# Patient Record
Sex: Female | Born: 1970 | Race: Black or African American | Hispanic: No | Marital: Single | Smoking: Current every day smoker
Health system: Southern US, Community
[De-identification: ages and names within clinical notes are randomized; demographics above are authoritative.]

## PROBLEM LIST (undated history)

## (undated) DIAGNOSIS — I10 Essential (primary) hypertension: Secondary | ICD-10-CM

## (undated) DIAGNOSIS — E785 Hyperlipidemia, unspecified: Secondary | ICD-10-CM

## (undated) HISTORY — DX: Hyperlipidemia, unspecified: E78.5

## (undated) HISTORY — PX: BREAST REDUCTION SURGERY: SHX8

## (undated) HISTORY — DX: Essential (primary) hypertension: I10

## (undated) HISTORY — PX: REDUCTION MAMMAPLASTY: SUR839

---

## 2007-12-28 ENCOUNTER — Other Ambulatory Visit: Admission: RE | Admit: 2007-12-28 | Discharge: 2007-12-28 | Payer: Self-pay | Admitting: Family Medicine

## 2008-11-13 ENCOUNTER — Encounter: Admission: RE | Admit: 2008-11-13 | Discharge: 2009-01-14 | Payer: Self-pay | Admitting: Internal Medicine

## 2009-01-13 ENCOUNTER — Ambulatory Visit (HOSPITAL_COMMUNITY): Admission: RE | Admit: 2009-01-13 | Discharge: 2009-01-13 | Payer: Self-pay | Admitting: Internal Medicine

## 2009-02-05 ENCOUNTER — Ambulatory Visit: Payer: Self-pay | Admitting: Sports Medicine

## 2009-02-05 DIAGNOSIS — M25569 Pain in unspecified knee: Secondary | ICD-10-CM | POA: Insufficient documentation

## 2009-02-05 DIAGNOSIS — S93499A Sprain of other ligament of unspecified ankle, initial encounter: Secondary | ICD-10-CM | POA: Insufficient documentation

## 2009-02-05 DIAGNOSIS — S96819A Strain of other specified muscles and tendons at ankle and foot level, unspecified foot, initial encounter: Secondary | ICD-10-CM

## 2009-02-05 DIAGNOSIS — M7040 Prepatellar bursitis, unspecified knee: Secondary | ICD-10-CM | POA: Insufficient documentation

## 2009-03-05 ENCOUNTER — Ambulatory Visit: Payer: Self-pay | Admitting: Sports Medicine

## 2009-04-02 ENCOUNTER — Other Ambulatory Visit: Admission: RE | Admit: 2009-04-02 | Discharge: 2009-04-02 | Payer: Self-pay | Admitting: Family Medicine

## 2009-06-01 ENCOUNTER — Emergency Department (HOSPITAL_COMMUNITY): Admission: EM | Admit: 2009-06-01 | Discharge: 2009-06-01 | Payer: Self-pay | Admitting: Family Medicine

## 2010-02-18 ENCOUNTER — Other Ambulatory Visit: Admission: RE | Admit: 2010-02-18 | Discharge: 2010-02-18 | Payer: Self-pay | Admitting: Family Medicine

## 2010-07-05 ENCOUNTER — Encounter: Payer: Self-pay | Admitting: Internal Medicine

## 2011-01-12 ENCOUNTER — Inpatient Hospital Stay (INDEPENDENT_AMBULATORY_CARE_PROVIDER_SITE_OTHER)
Admission: RE | Admit: 2011-01-12 | Discharge: 2011-01-12 | Disposition: A | Discharge status: Home / Self Care | Payer: 59 | Source: Ambulatory Visit | Attending: Emergency Medicine | Admitting: Emergency Medicine

## 2011-01-12 DIAGNOSIS — J45909 Unspecified asthma, uncomplicated: Secondary | ICD-10-CM

## 2011-01-12 DIAGNOSIS — R059 Cough, unspecified: Secondary | ICD-10-CM

## 2011-01-12 DIAGNOSIS — R05 Cough: Secondary | ICD-10-CM

## 2011-04-04 ENCOUNTER — Encounter: Payer: Self-pay | Admitting: Family Medicine

## 2011-04-05 ENCOUNTER — Encounter: Payer: Self-pay | Admitting: Family Medicine

## 2011-04-05 ENCOUNTER — Other Ambulatory Visit (HOSPITAL_COMMUNITY)
Admission: RE | Admit: 2011-04-05 | Discharge: 2011-04-05 | Disposition: A | Discharge status: Home / Self Care | Payer: 59 | Source: Ambulatory Visit | Attending: Family Medicine | Admitting: Family Medicine

## 2011-04-05 ENCOUNTER — Ambulatory Visit (INDEPENDENT_AMBULATORY_CARE_PROVIDER_SITE_OTHER): Payer: 59 | Admitting: Family Medicine

## 2011-04-05 VITALS — BP 132/72 | HR 83 | Temp 98.7°F | Ht 63.0 in | Wt 163.0 lb

## 2011-04-05 DIAGNOSIS — I10 Essential (primary) hypertension: Secondary | ICD-10-CM | POA: Insufficient documentation

## 2011-04-05 DIAGNOSIS — Z0000 Encounter for general adult medical examination without abnormal findings: Secondary | ICD-10-CM

## 2011-04-05 DIAGNOSIS — J45909 Unspecified asthma, uncomplicated: Secondary | ICD-10-CM | POA: Insufficient documentation

## 2011-04-05 DIAGNOSIS — Z01419 Encounter for gynecological examination (general) (routine) without abnormal findings: Secondary | ICD-10-CM | POA: Insufficient documentation

## 2011-04-05 DIAGNOSIS — E785 Hyperlipidemia, unspecified: Secondary | ICD-10-CM | POA: Insufficient documentation

## 2011-04-05 NOTE — Progress Notes (Signed)
Subjective:     Shakeema Lippman is a 40 y.o. female and is here for a comprehensive physical exam. The patient reports no problems.  History   Social History  . Marital Status: Single    Spouse Name: N/A    Number of Children: N/A  . Years of Education: N/A   Occupational History  . [employer] [place]   Social History Main Topics  . Smoking status: Current Everyday Smoker -- 1.0 packs/day for 20 years    Types: Cigarettes  . Smokeless tobacco: Current User    Types: Chew  . Alcohol Use: Yes     Socially  . Drug Use: No  . Sexually Active: Yes -- Female partner(s)   Other Topics Concern  . Not on file   Social History Narrative  . No narrative on file   Health Maintenance  Topic Date Due  . Mammogram  05/30/1988  . Pap Smear  05/30/1988  . Influenza Vaccine  03/13/2012  . Tetanus/tdap  08/05/2016    The following portions of the patient's history were reviewed and updated as appropriate: allergies, current medications, past family history, past medical history, past social history, past surgical history and problem list.  Review of Systems Review of Systems  Constitutional: Negative for activity change, appetite change and fatigue.  HENT: Negative for hearing loss, congestion, tinnitus and ear discharge.  dentist q61m Eyes: Negative for visual disturbance (see optho q1y -- vision corrected to 20/20 with glasses).  Respiratory: Negative for cough, chest tightness and shortness of breath.   Cardiovascular: Negative for chest pain, palpitations and leg swelling.  Gastrointestinal: Negative for abdominal pain, diarrhea, constipation and abdominal distention.  Genitourinary: Negative for urgency, frequency, decreased urine volume and difficulty urinating.  Musculoskeletal: Negative for back pain, arthralgias and gait problem.  Skin: Negative for color change, pallor and rash.  Neurological: Negative for dizziness, light-headedness, numbness and  headaches.  Hematological: Negative for adenopathy. Does not bruise/bleed easily.  Psychiatric/Behavioral: Negative for suicidal ideas, confusion, sleep disturbance, self-injury, dysphoric mood, decreased concentration and agitation.       Objective:    BP 132/72  Pulse 83  Temp(Src) 98.7 F (37.1 C) (Oral)  Ht 5\' 3"  (1.6 m)  Wt 163 lb (73.936 kg)  BMI 28.87 kg/m2  SpO2 98%  LMP 03/28/2011 General appearance: alert, cooperative, appears stated age and no distress Head: Normocephalic, without obvious abnormality, atraumatic Eyes: conjunctivae/corneas clear. PERRL, EOM's intact. Fundi benign. Ears: normal TM's and external ear canals both ears Nose: Nares normal. Septum midline. Mucosa normal. No drainage or sinus tenderness. Throat: lips, mucosa, and tongue normal; teeth and gums normal Neck: no adenopathy, no carotid bruit, no JVD, supple, symmetrical, trachea midline and thyroid not enlarged, symmetric, no tenderness/mass/nodules Lungs: clear to auscultation bilaterally Breasts: normal appearance, no masses or tenderness Heart: regular rate and rhythm, S1, S2 normal, no murmur, click, rub or gallop Abdomen: soft, non-tender; bowel sounds normal; no masses,  no organomegaly Pelvic: cervix normal in appearance, external genitalia normal, no adnexal masses or tenderness, no cervical motion tenderness, rectovaginal septum normal, uterus normal size, shape, and consistency and vagina normal without discharge Extremities: extremities normal, atraumatic, no cyanosis or edema Pulses: 2+ and symmetric Skin: Skin color, texture, turgor normal. No rashes or lesions Lymph nodes: Cervical, supraclavicular, and axillary nodes normal. Neurologic: Alert and oriented X 3, normal strength and tone. Normal symmetric reflexes. Normal coordination and gait psych-- no depression or anxiety    Assessment:    Healthy  female exam.    HTN-- con't meds, stable hyperlipidemia Plan:  GHm utd Check  fasting labs   See After Visit Summary for Counseling Recommendations

## 2011-04-05 NOTE — Patient Instructions (Signed)

## 2011-04-06 ENCOUNTER — Other Ambulatory Visit (INDEPENDENT_AMBULATORY_CARE_PROVIDER_SITE_OTHER): Payer: 59

## 2011-04-06 DIAGNOSIS — E785 Hyperlipidemia, unspecified: Secondary | ICD-10-CM

## 2011-04-06 DIAGNOSIS — I10 Essential (primary) hypertension: Secondary | ICD-10-CM

## 2011-04-06 DIAGNOSIS — Z0000 Encounter for general adult medical examination without abnormal findings: Secondary | ICD-10-CM

## 2011-04-06 LAB — HEPATIC FUNCTION PANEL
ALT: 23 U/L (ref 0–35)
AST: 19 U/L (ref 0–37)
Alkaline Phosphatase: 88 U/L (ref 39–117)
Bilirubin, Direct: 0 mg/dL (ref 0.0–0.3)
Total Bilirubin: 0.5 mg/dL (ref 0.3–1.2)
Total Protein: 7.3 g/dL (ref 6.0–8.3)

## 2011-04-06 LAB — BASIC METABOLIC PANEL
BUN: 11 mg/dL (ref 6–23)
Calcium: 9.1 mg/dL (ref 8.4–10.5)
Creatinine, Ser: 0.6 mg/dL (ref 0.4–1.2)
GFR: 150.43 mL/min (ref >60.00)
Glucose, Bld: 101 mg/dL — ABNORMAL HIGH (ref 70–99)
Potassium: 3.2 mEq/L — ABNORMAL LOW (ref 3.5–5.1)

## 2011-04-06 LAB — CBC WITH DIFFERENTIAL/PLATELET
Basophils Absolute: 0 10*3/uL (ref 0.0–0.1)
Eosinophils Relative: 4.9 % (ref 0.0–5.0)
Lymphocytes Relative: 22.8 % (ref 12.0–46.0)
Lymphs Abs: 2 10*3/uL (ref 0.7–4.0)
Monocytes Relative: 4 % (ref 3.0–12.0)
Neutrophils Relative %: 67.8 % (ref 43.0–77.0)
Platelets: 246 10*3/uL (ref 150.0–400.0)
RDW: 13.5 % (ref 11.5–14.6)
WBC: 8.8 10*3/uL (ref 4.5–10.5)

## 2011-04-06 LAB — LDL CHOLESTEROL, DIRECT: Direct LDL: 164.8 mg/dL

## 2011-04-06 LAB — LIPID PANEL: HDL: 34.8 mg/dL — ABNORMAL LOW (ref >39.00)

## 2011-04-06 LAB — TSH: TSH: 0.83 u[IU]/mL (ref 0.35–5.50)

## 2011-04-06 NOTE — Progress Notes (Signed)
Addended by: Legrand Como on: 04/06/2011 01:30 PM   Modules accepted: Orders

## 2011-04-06 NOTE — Progress Notes (Signed)
Labs only

## 2011-04-14 ENCOUNTER — Other Ambulatory Visit: Payer: Self-pay | Admitting: Family Medicine

## 2011-04-14 DIAGNOSIS — Z1231 Encounter for screening mammogram for malignant neoplasm of breast: Secondary | ICD-10-CM

## 2011-04-15 ENCOUNTER — Telehealth: Payer: Self-pay

## 2011-04-15 MED ORDER — POTASSIUM CHLORIDE CRYS ER 20 MEQ PO TBCR: 20.0000 meq | EXTENDED_RELEASE_TABLET | Freq: Every day | ORAL | Status: DC

## 2011-04-15 NOTE — Telephone Encounter (Signed)
Discussed with patient and she voiced understanding. Rx faxed to the pharmacy and copy mailed to patient-apt scheduled       KP

## 2011-04-15 NOTE — Telephone Encounter (Signed)
Message copied by Arnette Norris on Fri Apr 15, 2011 10:15 AM ------      Message from: Lelon Perla      Created: Thu Apr 07, 2011  3:38 PM       K low--- if pt is eating foods with K in it daily we need to add KCL 20 meq daily---recheck 2 weeks  276.8  Bmp      Cholesterol--- LDL goal < 100,  HDL >40,  TG < 150.  Diet and exercise will increase HDL and decrease LDL and TG.  Fish,  Fish Oil, Flaxseed oil will also help increase the HDL and decrease Triglycerides.   Recheck labs in 3 months.  If no better we will have to add medication.    272.4  HDL diagnostics,  Lipid, hep, bmp

## 2011-04-20 ENCOUNTER — Ambulatory Visit
Admission: RE | Admit: 2011-04-20 | Discharge: 2011-04-20 | Disposition: A | Discharge status: Home / Self Care | Payer: 59 | Source: Ambulatory Visit | Attending: Family Medicine | Admitting: Family Medicine

## 2011-04-20 DIAGNOSIS — Z1231 Encounter for screening mammogram for malignant neoplasm of breast: Secondary | ICD-10-CM

## 2011-04-28 ENCOUNTER — Other Ambulatory Visit: Payer: Self-pay | Admitting: Family Medicine

## 2011-04-28 DIAGNOSIS — E876 Hypokalemia: Secondary | ICD-10-CM

## 2011-04-29 ENCOUNTER — Other Ambulatory Visit (INDEPENDENT_AMBULATORY_CARE_PROVIDER_SITE_OTHER): Payer: 59

## 2011-04-29 DIAGNOSIS — E876 Hypokalemia: Secondary | ICD-10-CM

## 2011-04-29 LAB — BASIC METABOLIC PANEL
CO2: 25 mEq/L (ref 19–32)
Calcium: 9 mg/dL (ref 8.4–10.5)
Chloride: 110 mEq/L (ref 96–112)
Glucose, Bld: 106 mg/dL — ABNORMAL HIGH (ref 70–99)
Potassium: 3.3 mEq/L — ABNORMAL LOW (ref 3.5–5.1)
Sodium: 143 mEq/L (ref 135–145)

## 2011-04-29 NOTE — Progress Notes (Signed)
12  

## 2011-05-16 ENCOUNTER — Other Ambulatory Visit: Payer: Self-pay | Admitting: Family Medicine

## 2011-05-16 DIAGNOSIS — E876 Hypokalemia: Secondary | ICD-10-CM

## 2011-05-17 ENCOUNTER — Other Ambulatory Visit (INDEPENDENT_AMBULATORY_CARE_PROVIDER_SITE_OTHER): Payer: 59

## 2011-05-17 DIAGNOSIS — E876 Hypokalemia: Secondary | ICD-10-CM

## 2011-05-17 LAB — BASIC METABOLIC PANEL
BUN: 11 mg/dL (ref 6–23)
CO2: 25 mEq/L (ref 19–32)
Chloride: 108 mEq/L (ref 96–112)
Creatinine, Ser: 0.6 mg/dL (ref 0.4–1.2)
Glucose, Bld: 133 mg/dL — ABNORMAL HIGH (ref 70–99)
Potassium: 3.7 mEq/L (ref 3.5–5.1)

## 2011-05-17 NOTE — Progress Notes (Signed)
12  

## 2011-05-27 ENCOUNTER — Other Ambulatory Visit: Payer: Self-pay

## 2011-05-27 MED ORDER — POTASSIUM CHLORIDE CRYS ER 20 MEQ PO TBCR: 20.0000 meq | EXTENDED_RELEASE_TABLET | Freq: Every day | ORAL | Status: DC

## 2011-05-31 ENCOUNTER — Other Ambulatory Visit: Payer: Self-pay | Admitting: Family Medicine

## 2011-05-31 DIAGNOSIS — R7989 Other specified abnormal findings of blood chemistry: Secondary | ICD-10-CM

## 2011-05-31 DIAGNOSIS — E785 Hyperlipidemia, unspecified: Secondary | ICD-10-CM

## 2011-06-01 ENCOUNTER — Other Ambulatory Visit: Payer: 59

## 2011-08-11 ENCOUNTER — Other Ambulatory Visit: Payer: Self-pay | Admitting: *Deleted

## 2011-08-11 MED ORDER — HYDROCHLOROTHIAZIDE 12.5 MG PO CAPS: 12.5000 mg | ORAL_CAPSULE | Freq: Every day | ORAL | Status: DC

## 2011-08-11 NOTE — Telephone Encounter (Signed)
Rx sent, Pt aware. 

## 2012-01-27 ENCOUNTER — Other Ambulatory Visit: Payer: Self-pay | Admitting: Family Medicine

## 2012-04-26 ENCOUNTER — Other Ambulatory Visit: Payer: Self-pay | Admitting: Family Medicine

## 2012-04-26 DIAGNOSIS — Z1231 Encounter for screening mammogram for malignant neoplasm of breast: Secondary | ICD-10-CM

## 2012-05-29 ENCOUNTER — Ambulatory Visit
Admission: RE | Admit: 2012-05-29 | Discharge: 2012-05-29 | Disposition: A | Discharge status: Home / Self Care | Payer: 59 | Source: Ambulatory Visit | Attending: Family Medicine | Admitting: Family Medicine

## 2012-05-29 DIAGNOSIS — Z1231 Encounter for screening mammogram for malignant neoplasm of breast: Secondary | ICD-10-CM

## 2012-06-12 ENCOUNTER — Other Ambulatory Visit: Payer: Self-pay | Admitting: Family Medicine

## 2012-06-12 DIAGNOSIS — R928 Other abnormal and inconclusive findings on diagnostic imaging of breast: Secondary | ICD-10-CM

## 2012-06-14 ENCOUNTER — Ambulatory Visit
Admission: RE | Admit: 2012-06-14 | Discharge: 2012-06-14 | Disposition: A | Discharge status: Home / Self Care | Payer: 59 | Source: Ambulatory Visit | Attending: Family Medicine | Admitting: Family Medicine

## 2012-06-14 DIAGNOSIS — R928 Other abnormal and inconclusive findings on diagnostic imaging of breast: Secondary | ICD-10-CM

## 2012-06-27 ENCOUNTER — Ambulatory Visit (INDEPENDENT_AMBULATORY_CARE_PROVIDER_SITE_OTHER): Payer: 59 | Admitting: Family Medicine

## 2012-06-27 ENCOUNTER — Encounter: Payer: Self-pay | Admitting: Family Medicine

## 2012-06-27 VITALS — BP 132/74 | HR 80 | Temp 98.5°F | Ht 63.0 in | Wt 155.6 lb

## 2012-06-27 DIAGNOSIS — Z0000 Encounter for general adult medical examination without abnormal findings: Secondary | ICD-10-CM

## 2012-06-27 DIAGNOSIS — I10 Essential (primary) hypertension: Secondary | ICD-10-CM

## 2012-06-27 DIAGNOSIS — J45909 Unspecified asthma, uncomplicated: Secondary | ICD-10-CM

## 2012-06-27 DIAGNOSIS — Z720 Tobacco use: Secondary | ICD-10-CM

## 2012-06-27 DIAGNOSIS — F17200 Nicotine dependence, unspecified, uncomplicated: Secondary | ICD-10-CM

## 2012-06-27 MED ORDER — BECLOMETHASONE DIPROPIONATE 40 MCG/ACT IN AERS: 2.0000 | INHALATION_SPRAY | Freq: Two times a day (BID) | RESPIRATORY_TRACT | Status: DC

## 2012-06-27 NOTE — Assessment & Plan Note (Signed)
Add qvar 40 mg 2 puffs bid con't proair Check PFTs

## 2012-06-27 NOTE — Assessment & Plan Note (Signed)
con't meds  Check labs 

## 2012-06-27 NOTE — Patient Instructions (Addendum)
Preventive Care for Adults, Female A healthy lifestyle and preventive care can promote health and wellness. Preventive health guidelines for women include the following key practices.  A routine yearly physical is a good way to check with your caregiver about your health and preventive screening. It is a chance to share any concerns and updates on your health, and to receive a thorough exam.  Visit your dentist for a routine exam and preventive care every 6 months. Brush your teeth twice a day and floss once a day. Good oral hygiene prevents tooth decay and gum disease.  The frequency of eye exams is based on your age, health, family medical history, use of contact lenses, and other factors. Follow your caregiver's recommendations for frequency of eye exams.  Eat a healthy diet. Foods like vegetables, fruits, whole grains, low-fat dairy products, and lean protein foods contain the nutrients you need without too many calories. Decrease your intake of foods high in solid fats, added sugars, and salt. Eat the right amount of calories for you.Get information about a proper diet from your caregiver, if necessary.  Regular physical exercise is one of the most important things you can do for your health. Most adults should get at least 150 minutes of moderate-intensity exercise (any activity that increases your heart rate and causes you to sweat) each week. In addition, most adults need muscle-strengthening exercises on 2 or more days a week.  Maintain a healthy weight. The body mass index (BMI) is a screening tool to identify possible weight problems. It provides an estimate of body fat based on height and weight. Your caregiver can help determine your BMI, and can help you achieve or maintain a healthy weight.For adults 20 years and older:  A BMI below 18.5 is considered underweight.  A BMI of 18.5 to 24.9 is normal.  A BMI of 25 to 29.9 is considered overweight.  A BMI of 30 and above is  considered obese.  Maintain normal blood lipids and cholesterol levels by exercising and minimizing your intake of saturated fat. Eat a balanced diet with plenty of fruit and vegetables. Blood tests for lipids and cholesterol should begin at age 20 and be repeated every 5 years. If your lipid or cholesterol levels are high, you are over 50, or you are at high risk for heart disease, you may need your cholesterol levels checked more frequently.Ongoing high lipid and cholesterol levels should be treated with medicines if diet and exercise are not effective.  If you smoke, find out from your caregiver how to quit. If you do not use tobacco, do not start.  If you are pregnant, do not drink alcohol. If you are breastfeeding, be very cautious about drinking alcohol. If you are not pregnant and choose to drink alcohol, do not exceed 1 drink per day. One drink is considered to be 12 ounces (355 mL) of beer, 5 ounces (148 mL) of wine, or 1.5 ounces (44 mL) of liquor.  Avoid use of street drugs. Do not share needles with anyone. Ask for help if you need support or instructions about stopping the use of drugs.  High blood pressure causes heart disease and increases the risk of stroke. Your blood pressure should be checked at least every 1 to 2 years. Ongoing high blood pressure should be treated with medicines if weight loss and exercise are not effective.  If you are 55 to 43 years old, ask your caregiver if you should take aspirin to prevent strokes.  Diabetes   screening involves taking a blood sample to check your fasting blood sugar level. This should be done once every 3 years, after age 45, if you are within normal weight and without risk factors for diabetes. Testing should be considered at a younger age or be carried out more frequently if you are overweight and have at least 1 risk factor for diabetes.  Breast cancer screening is essential preventive care for women. You should practice "breast  self-awareness." This means understanding the normal appearance and feel of your breasts and may include breast self-examination. Any changes detected, no matter how small, should be reported to a caregiver. Women in their 20s and 30s should have a clinical breast exam (CBE) by a caregiver as part of a regular health exam every 1 to 3 years. After age 40, women should have a CBE every year. Starting at age 40, women should consider having a mammography (breast X-ray test) every year. Women who have a family history of breast cancer should talk to their caregiver about genetic screening. Women at a high risk of breast cancer should talk to their caregivers about having magnetic resonance imaging (MRI) and a mammography every year.  The Pap test is a screening test for cervical cancer. A Pap test can show cell changes on the cervix that might become cervical cancer if left untreated. A Pap test is a procedure in which cells are obtained and examined from the lower end of the uterus (cervix).  Women should have a Pap test starting at age 21.  Between ages 21 and 29, Pap tests should be repeated every 2 years.  Beginning at age 30, you should have a Pap test every 3 years as long as the past 3 Pap tests have been normal.  Some women have medical problems that increase the chance of getting cervical cancer. Talk to your caregiver about these problems. It is especially important to talk to your caregiver if a new problem develops soon after your last Pap test. In these cases, your caregiver may recommend more frequent screening and Pap tests.  The above recommendations are the same for women who have or have not gotten the vaccine for human papillomavirus (HPV).  If you had a hysterectomy for a problem that was not cancer or a condition that could lead to cancer, then you no longer need Pap tests. Even if you no longer need a Pap test, a regular exam is a good idea to make sure no other problems are  starting.  If you are between ages 65 and 70, and you have had normal Pap tests going back 10 years, you no longer need Pap tests. Even if you no longer need a Pap test, a regular exam is a good idea to make sure no other problems are starting.  If you have had past treatment for cervical cancer or a condition that could lead to cancer, you need Pap tests and screening for cancer for at least 20 years after your treatment.  If Pap tests have been discontinued, risk factors (such as a new sexual partner) need to be reassessed to determine if screening should be resumed.  The HPV test is an additional test that may be used for cervical cancer screening. The HPV test looks for the virus that can cause the cell changes on the cervix. The cells collected during the Pap test can be tested for HPV. The HPV test could be used to screen women aged 30 years and older, and should   be used in women of any age who have unclear Pap test results. After the age of 30, women should have HPV testing at the same frequency as a Pap test.  Colorectal cancer can be detected and often prevented. Most routine colorectal cancer screening begins at the age of 50 and continues through age 75. However, your caregiver may recommend screening at an earlier age if you have risk factors for colon cancer. On a yearly basis, your caregiver may provide home test kits to check for hidden blood in the stool. Use of a small camera at the end of a tube, to directly examine the colon (sigmoidoscopy or colonoscopy), can detect the earliest forms of colorectal cancer. Talk to your caregiver about this at age 50, when routine screening begins. Direct examination of the colon should be repeated every 5 to 10 years through age 75, unless early forms of pre-cancerous polyps or small growths are found.  Hepatitis C blood testing is recommended for all people born from 1945 through 1965 and any individual with known risks for hepatitis C.  Practice  safe sex. Use condoms and avoid high-risk sexual practices to reduce the spread of sexually transmitted infections (STIs). STIs include gonorrhea, chlamydia, syphilis, trichomonas, herpes, HPV, and human immunodeficiency virus (HIV). Herpes, HIV, and HPV are viral illnesses that have no cure. They can result in disability, cancer, and death. Sexually active women aged 25 and younger should be checked for chlamydia. Older women with new or multiple partners should also be tested for chlamydia. Testing for other STIs is recommended if you are sexually active and at increased risk.  Osteoporosis is a disease in which the bones lose minerals and strength with aging. This can result in serious bone fractures. The risk of osteoporosis can be identified using a bone density scan. Women ages 65 and over and women at risk for fractures or osteoporosis should discuss screening with their caregivers. Ask your caregiver whether you should take a calcium supplement or vitamin D to reduce the rate of osteoporosis.  Menopause can be associated with physical symptoms and risks. Hormone replacement therapy is available to decrease symptoms and risks. You should talk to your caregiver about whether hormone replacement therapy is right for you.  Use sunscreen with sun protection factor (SPF) of 30 or more. Apply sunscreen liberally and repeatedly throughout the day. You should seek shade when your shadow is shorter than you. Protect yourself by wearing long sleeves, pants, a wide-brimmed hat, and sunglasses year round, whenever you are outdoors.  Once a month, do a whole body skin exam, using a mirror to look at the skin on your back. Notify your caregiver of new moles, moles that have irregular borders, moles that are larger than a pencil eraser, or moles that have changed in shape or color.  Stay current with required immunizations.  Influenza. You need a dose every fall (or winter). The composition of the flu vaccine  changes each year, so being vaccinated once is not enough.  Pneumococcal polysaccharide. You need 1 to 2 doses if you smoke cigarettes or if you have certain chronic medical conditions. You need 1 dose at age 65 (or older) if you have never been vaccinated.  Tetanus, diphtheria, pertussis (Tdap, Td). Get 1 dose of Tdap vaccine if you are younger than age 65, are over 65 and have contact with an infant, are a healthcare worker, are pregnant, or simply want to be protected from whooping cough. After that, you need a Td   booster dose every 10 years. Consult your caregiver if you have not had at least 3 tetanus and diphtheria-containing shots sometime in your life or have a deep or dirty wound.  HPV. You need this vaccine if you are a woman age 26 or younger. The vaccine is given in 3 doses over 6 months.  Measles, mumps, rubella (MMR). You need at least 1 dose of MMR if you were born in 1957 or later. You may also need a second dose.  Meningococcal. If you are age 19 to 21 and a first-year college student living in a residence hall, or have one of several medical conditions, you need to get vaccinated against meningococcal disease. You may also need additional booster doses.  Zoster (shingles). If you are age 60 or older, you should get this vaccine.  Varicella (chickenpox). If you have never had chickenpox or you were vaccinated but received only 1 dose, talk to your caregiver to find out if you need this vaccine.  Hepatitis A. You need this vaccine if you have a specific risk factor for hepatitis A virus infection or you simply wish to be protected from this disease. The vaccine is usually given as 2 doses, 6 to 18 months apart.  Hepatitis B. You need this vaccine if you have a specific risk factor for hepatitis B virus infection or you simply wish to be protected from this disease. The vaccine is given in 3 doses, usually over 6 months. Preventive Services / Frequency Ages 19 to 39  Blood  pressure check.** / Every 1 to 2 years.  Lipid and cholesterol check.** / Every 5 years beginning at age 20.  Clinical breast exam.** / Every 3 years for women in their 20s and 30s.  Pap test.** / Every 2 years from ages 21 through 29. Every 3 years starting at age 30 through age 65 or 70 with a history of 3 consecutive normal Pap tests.  HPV screening.** / Every 3 years from ages 30 through ages 65 to 70 with a history of 3 consecutive normal Pap tests.  Hepatitis C blood test.** / For any individual with known risks for hepatitis C.  Skin self-exam. / Monthly.  Influenza immunization.** / Every year.  Pneumococcal polysaccharide immunization.** / 1 to 2 doses if you smoke cigarettes or if you have certain chronic medical conditions.  Tetanus, diphtheria, pertussis (Tdap, Td) immunization. / A one-time dose of Tdap vaccine. After that, you need a Td booster dose every 10 years.  HPV immunization. / 3 doses over 6 months, if you are 26 and younger.  Measles, mumps, rubella (MMR) immunization. / You need at least 1 dose of MMR if you were born in 1957 or later. You may also need a second dose.  Meningococcal immunization. / 1 dose if you are age 19 to 21 and a first-year college student living in a residence hall, or have one of several medical conditions, you need to get vaccinated against meningococcal disease. You may also need additional booster doses.  Varicella immunization.** / Consult your caregiver.  Hepatitis A immunization.** / Consult your caregiver. 2 doses, 6 to 18 months apart.  Hepatitis B immunization.** / Consult your caregiver. 3 doses usually over 6 months. Ages 40 to 64  Blood pressure check.** / Every 1 to 2 years.  Lipid and cholesterol check.** / Every 5 years beginning at age 20.  Clinical breast exam.** / Every year after age 40.  Mammogram.** / Every year beginning at age 40   and continuing for as long as you are in good health. Consult with your  caregiver.  Pap test.** / Every 3 years starting at age 30 through age 65 or 70 with a history of 3 consecutive normal Pap tests.  HPV screening.** / Every 3 years from ages 30 through ages 65 to 70 with a history of 3 consecutive normal Pap tests.  Fecal occult blood test (FOBT) of stool. / Every year beginning at age 50 and continuing until age 75. You may not need to do this test if you get a colonoscopy every 10 years.  Flexible sigmoidoscopy or colonoscopy.** / Every 5 years for a flexible sigmoidoscopy or every 10 years for a colonoscopy beginning at age 50 and continuing until age 75.  Hepatitis C blood test.** / For all people born from 1945 through 1965 and any individual with known risks for hepatitis C.  Skin self-exam. / Monthly.  Influenza immunization.** / Every year.  Pneumococcal polysaccharide immunization.** / 1 to 2 doses if you smoke cigarettes or if you have certain chronic medical conditions.  Tetanus, diphtheria, pertussis (Tdap, Td) immunization.** / A one-time dose of Tdap vaccine. After that, you need a Td booster dose every 10 years.  Measles, mumps, rubella (MMR) immunization. / You need at least 1 dose of MMR if you were born in 1957 or later. You may also need a second dose.  Varicella immunization.** / Consult your caregiver.  Meningococcal immunization.** / Consult your caregiver.  Hepatitis A immunization.** / Consult your caregiver. 2 doses, 6 to 18 months apart.  Hepatitis B immunization.** / Consult your caregiver. 3 doses, usually over 6 months. Ages 65 and over  Blood pressure check.** / Every 1 to 2 years.  Lipid and cholesterol check.** / Every 5 years beginning at age 20.  Clinical breast exam.** / Every year after age 40.  Mammogram.** / Every year beginning at age 40 and continuing for as long as you are in good health. Consult with your caregiver.  Pap test.** / Every 3 years starting at age 30 through age 65 or 70 with a 3  consecutive normal Pap tests. Testing can be stopped between 65 and 70 with 3 consecutive normal Pap tests and no abnormal Pap or HPV tests in the past 10 years.  HPV screening.** / Every 3 years from ages 30 through ages 65 or 70 with a history of 3 consecutive normal Pap tests. Testing can be stopped between 65 and 70 with 3 consecutive normal Pap tests and no abnormal Pap or HPV tests in the past 10 years.  Fecal occult blood test (FOBT) of stool. / Every year beginning at age 50 and continuing until age 75. You may not need to do this test if you get a colonoscopy every 10 years.  Flexible sigmoidoscopy or colonoscopy.** / Every 5 years for a flexible sigmoidoscopy or every 10 years for a colonoscopy beginning at age 50 and continuing until age 75.  Hepatitis C blood test.** / For all people born from 1945 through 1965 and any individual with known risks for hepatitis C.  Osteoporosis screening.** / A one-time screening for women ages 65 and over and women at risk for fractures or osteoporosis.  Skin self-exam. / Monthly.  Influenza immunization.** / Every year.  Pneumococcal polysaccharide immunization.** / 1 dose at age 65 (or older) if you have never been vaccinated.  Tetanus, diphtheria, pertussis (Tdap, Td) immunization. / A one-time dose of Tdap vaccine if you are over   65 and have contact with an infant, are a healthcare worker, or simply want to be protected from whooping cough. After that, you need a Td booster dose every 10 years.  Varicella immunization.** / Consult your caregiver.  Meningococcal immunization.** / Consult your caregiver.  Hepatitis A immunization.** / Consult your caregiver. 2 doses, 6 to 18 months apart.  Hepatitis B immunization.** / Check with your caregiver. 3 doses, usually over 6 months. ** Family history and personal history of risk and conditions may change your caregiver's recommendations. Document Released: 07/26/2001 Document Revised: 08/22/2011  Document Reviewed: 10/25/2010 [employer] Patient Information 2013 [employer].  

## 2012-06-27 NOTE — Progress Notes (Signed)
Subjective:     Kimberly Rollins is a 42 y.o. female and is here for a comprehensive physical exam. The patient reports no problems.  History   Social History  . Marital Status: Single    Spouse Name: N/A    Number of Children: N/A  . Years of Education: N/A   Occupational History  . 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 Smoking status: Current Every Day Smoker -- 1.0 packs/day for 20 years    Types: Cigarettes  . Smokeless tobacco: Current User    Types: Chew     Comment: program at work--pt will try  . Alcohol Use: Yes     Comment: Socially  . Drug Use: No  . Sexually Active: Yes -- Female partner(s)   Other Topics Concern  . Not on file   Social History Narrative   Exercising-- no   Health Maintenance  Topic Date Due  . Influenza Vaccine  02/11/2013  . Mammogram  06/14/2013  . Pap Smear  04/04/2014  . Tetanus/tdap  08/05/2016    The following portions of the patient's history were reviewed and updated as appropriate:  She  has a past medical history of Asthma; Hypertension; and Hyperlipidemia. She  does not have any pertinent problems on file. She  has past surgical history that includes Cesarean section (1998) and Breast reduction surgery. Her family history includes Diabetes in her father and Hypertension in her father. She  reports that she has been smoking Cigarettes.  She has a 20 pack-year smoking history. Her smokeless tobacco use includes Chew. She reports that she drinks alcohol. She reports that she does not use illicit drugs. She has a current medication list which includes the following prescription(s): albuterol, hydrochlorothiazide, and potassium chloride sa. Current Outpatient Prescriptions on File Prior to Visit  Medication Sig Dispense Refill  . albuterol (PROVENTIL HFA;VENTOLIN HFA) 108 (90 BASE) MCG/ACT inhaler Inhale 2 puffs into the lungs every 6 (six) hours as needed.      . hydrochlorothiazide (MICROZIDE) 12.5 MG  capsule TAKE 1 CAPSULE BY MOUTH DAILY.  90 capsule  0  . potassium chloride SA (K-DUR,KLOR-CON) 20 MEQ tablet Take 1 tablet (20 mEq total) by mouth daily.  30 tablet  0   She  has no known allergies..  Review of Systems Review of Systems  Constitutional: Negative for activity change, appetite change and fatigue.  HENT: Negative for hearing loss, congestion, tinnitus and ear discharge.  dentist q28m Eyes: Negative for visual disturbance (see optho q1y -- vision corrected to 20/20 with glasses).  Respiratory: Negative for cough, chest tightness and shortness of breath.   Cardiovascular: Negative for chest pain, palpitations and leg swelling.  Gastrointestinal: Negative for abdominal pain, diarrhea, constipation and abdominal distention.  Genitourinary: Negative for urgency, frequency, decreased urine volume and difficulty urinating.  Musculoskeletal: Negative for back pain, arthralgias and gait problem.  Skin: Negative for color change, pallor and rash.  Neurological: Negative for dizziness, light-headedness, numbness and headaches.  Hematological: Negative for adenopathy. Does not bruise/bleed easily.  Psychiatric/Behavioral: Negative for suicidal ideas, confusion, sleep disturbance, self-injury, dysphoric mood, decreased concentration and agitation.       Objective:    BP 132/74  Pulse 80  Temp 98.5 F (36.9 C) (Oral)  Ht 5\' 3"  (1.6 m)  Wt 155 lb 9.6 oz (70.58 kg)  BMI 27.56 kg/m2  SpO2 97%  LMP 06/27/2012 General appearance: alert, cooperative, appears stated age and no distress Head: Normocephalic, without  obvious abnormality, atraumatic Eyes: conjunctivae/corneas clear. PERRL, EOM's intact. Fundi benign. Ears: normal TM's and external ear canals both ears Nose: Nares normal. Septum midline. Mucosa normal. No drainage or sinus tenderness. Throat: lips, mucosa, and tongue normal; teeth and gums normal Neck: no adenopathy, supple, symmetrical, trachea midline and thyroid not  enlarged, symmetric, no tenderness/mass/nodules Back: symmetric, no curvature. ROM normal. No CVA tenderness. Lungs: clear to auscultation bilaterally Breasts: normal, no dimpling, no masses palpated, no nipple d/c, no axillary nodes Heart: regular rate and rhythm, S1, S2 normal, no murmur, click, rub or gallop Abdomen: soft, non-tender; bowel sounds normal; no masses,  no organomegaly Pelvic: deferred--on period Extremities: extremities normal, atraumatic, no cyanosis or edema Pulses: 2+ and symmetric Skin: Skin color, texture, turgor normal. No rashes or lesions Lymph nodes: Cervical, supraclavicular, and axillary nodes normal. Neurologic: Alert and oriented X 3, normal strength and tone. Normal symmetric reflexes. Normal coordination and gait psych-- no anxiety, no depression    Assessment:    Healthy female exam.      Plan:    check labs ghm utd See After Visit Summary for Counseling Recommendations

## 2012-07-20 ENCOUNTER — Ambulatory Visit (INDEPENDENT_AMBULATORY_CARE_PROVIDER_SITE_OTHER): Payer: 59 | Admitting: Family Medicine

## 2012-07-20 ENCOUNTER — Encounter: Payer: Self-pay | Admitting: Family Medicine

## 2012-07-20 ENCOUNTER — Other Ambulatory Visit (HOSPITAL_COMMUNITY)
Admission: RE | Admit: 2012-07-20 | Discharge: 2012-07-20 | Disposition: A | Discharge status: Home / Self Care | Payer: 59 | Source: Ambulatory Visit | Attending: Family Medicine | Admitting: Family Medicine

## 2012-07-20 ENCOUNTER — Other Ambulatory Visit: Payer: 59

## 2012-07-20 VITALS — BP 120/72 | HR 84 | Temp 97.4°F | Wt 155.0 lb

## 2012-07-20 DIAGNOSIS — Z0000 Encounter for general adult medical examination without abnormal findings: Secondary | ICD-10-CM

## 2012-07-20 DIAGNOSIS — Z124 Encounter for screening for malignant neoplasm of cervix: Secondary | ICD-10-CM

## 2012-07-20 DIAGNOSIS — Z01419 Encounter for gynecological examination (general) (routine) without abnormal findings: Secondary | ICD-10-CM

## 2012-07-20 LAB — CBC WITH DIFFERENTIAL/PLATELET
Basophils Absolute: 0 10*3/uL (ref 0.0–0.1)
Basophils Relative: 0 % (ref 0–1)
Eosinophils Absolute: 0.3 10*3/uL (ref 0.0–0.7)
HCT: 44.9 % (ref 36.0–46.0)
Hemoglobin: 15.9 g/dL — ABNORMAL HIGH (ref 12.0–15.0)
MCH: 31.4 pg (ref 26.0–34.0)
MCHC: 35.4 g/dL (ref 30.0–36.0)
Monocytes Absolute: 0.5 10*3/uL (ref 0.1–1.0)
Monocytes Relative: 5 % (ref 3–12)
Neutrophils Relative %: 68 % (ref 43–77)
RDW: 13.9 % (ref 11.5–15.5)

## 2012-07-20 LAB — BASIC METABOLIC PANEL
CO2: 25 mEq/L (ref 19–32)
Chloride: 110 mEq/L (ref 96–112)
Creat: 0.66 mg/dL (ref 0.50–1.10)
Glucose, Bld: 84 mg/dL (ref 70–99)

## 2012-07-20 LAB — HEPATIC FUNCTION PANEL
ALT: 12 U/L (ref 0–35)
AST: 13 U/L (ref 0–37)
Albumin: 4.1 g/dL (ref 3.5–5.2)

## 2012-07-20 LAB — POCT URINALYSIS DIPSTICK
Bilirubin, UA: NEGATIVE
Ketones, UA: NEGATIVE
Leukocytes, UA: NEGATIVE
Spec Grav, UA: 1.01
pH, UA: 6

## 2012-07-20 LAB — LIPID PANEL: Cholesterol: 166 mg/dL (ref 0–200)

## 2012-07-20 NOTE — Patient Instructions (Signed)

## 2012-07-20 NOTE — Progress Notes (Signed)
  Subjective:     Ailyne Pawley is a 42 y.o. woman who comes in today for a  pap smear only. Her most recent annual exam was on 06/27/2012. Her most recent Pap smear was a year ago and showed no abnormalities. Previous abnormal Pap smears: no. Contraception: none  The following portions of the patient's history were reviewed and updated as appropriate: allergies, current medications, past family history, past medical history, past social history, past surgical history and problem list.  Review of Systems Pertinent items are noted in HPI.   Objective:    BP 120/72  Pulse 84  Temp 97.4 F (36.3 C) (Oral)  Wt 155 lb (70.308 kg)  SpO2 97%  LMP 06/27/2012 Pelvic Exam: cervix normal in appearance, external genitalia normal, no adnexal masses or tenderness, no bladder tenderness, no cervical motion tenderness, urethra without abnormality or discharge, uterus normal size, shape, and consistency and vagina normal without discharge. Pap smear obtained.   Assessment:    Screening pap smear.   Plan:    Follow up in 1 year, or as indicated by Pap results.

## 2012-08-10 ENCOUNTER — Other Ambulatory Visit: Payer: Self-pay | Admitting: *Deleted

## 2012-08-10 ENCOUNTER — Other Ambulatory Visit: Payer: Self-pay | Admitting: Family Medicine

## 2012-08-10 MED ORDER — HYDROCHLOROTHIAZIDE 12.5 MG PO CAPS: ORAL_CAPSULE | ORAL | Status: DC

## 2012-08-10 NOTE — Telephone Encounter (Signed)
Rx sent 

## 2012-11-26 ENCOUNTER — Other Ambulatory Visit: Payer: Self-pay | Admitting: Family Medicine

## 2012-11-26 DIAGNOSIS — R921 Mammographic calcification found on diagnostic imaging of breast: Secondary | ICD-10-CM

## 2012-12-25 ENCOUNTER — Ambulatory Visit
Admission: RE | Admit: 2012-12-25 | Discharge: 2012-12-25 | Disposition: A | Discharge status: Home / Self Care | Payer: BC Managed Care – PPO | Source: Ambulatory Visit | Attending: Family Medicine | Admitting: Family Medicine

## 2012-12-25 DIAGNOSIS — R921 Mammographic calcification found on diagnostic imaging of breast: Secondary | ICD-10-CM

## 2013-05-14 ENCOUNTER — Telehealth: Payer: Self-pay | Admitting: Family Medicine

## 2013-05-14 ENCOUNTER — Other Ambulatory Visit: Payer: Self-pay | Admitting: Family Medicine

## 2013-05-14 DIAGNOSIS — R921 Mammographic calcification found on diagnostic imaging of breast: Secondary | ICD-10-CM

## 2013-05-14 MED ORDER — HYDROCHLOROTHIAZIDE 12.5 MG PO CAPS: ORAL_CAPSULE | ORAL | Status: DC

## 2013-05-14 MED ORDER — ALBUTEROL SULFATE HFA 108 (90 BASE) MCG/ACT IN AERS: 2.0000 | INHALATION_SPRAY | Freq: Four times a day (QID) | RESPIRATORY_TRACT | Status: DC | PRN

## 2013-05-14 NOTE — Telephone Encounter (Signed)
Rx faxed.    KP 

## 2013-05-14 NOTE — Telephone Encounter (Signed)
Patient states that she has switched pharmacies and now uses [facility] off of [address]. She needs a new rx hor her hydrochlorothiazide (MICROZIDE) 12.5 MG and she would like an rx for a Proair Inhaler. Please advise.

## 2013-06-28 ENCOUNTER — Ambulatory Visit
Admission: RE | Admit: 2013-06-28 | Discharge: 2013-06-28 | Disposition: A | Discharge status: Home / Self Care | Payer: BC Managed Care – PPO | Source: Ambulatory Visit | Attending: Family Medicine | Admitting: Family Medicine

## 2013-06-28 DIAGNOSIS — R921 Mammographic calcification found on diagnostic imaging of breast: Secondary | ICD-10-CM

## 2013-08-02 ENCOUNTER — Other Ambulatory Visit (HOSPITAL_COMMUNITY)
Admission: RE | Admit: 2013-08-02 | Discharge: 2013-08-02 | Disposition: A | Discharge status: Home / Self Care | Payer: BC Managed Care – PPO | Source: Ambulatory Visit | Attending: Family Medicine | Admitting: Family Medicine

## 2013-08-02 ENCOUNTER — Encounter: Payer: Self-pay | Admitting: Family Medicine

## 2013-08-02 ENCOUNTER — Ambulatory Visit (INDEPENDENT_AMBULATORY_CARE_PROVIDER_SITE_OTHER): Payer: BC Managed Care – PPO | Admitting: Family Medicine

## 2013-08-02 VITALS — BP 156/94 | HR 81 | Temp 98.5°F | Wt 157.0 lb

## 2013-08-02 DIAGNOSIS — Z01419 Encounter for gynecological examination (general) (routine) without abnormal findings: Secondary | ICD-10-CM | POA: Insufficient documentation

## 2013-08-02 DIAGNOSIS — Z1151 Encounter for screening for human papillomavirus (HPV): Secondary | ICD-10-CM | POA: Insufficient documentation

## 2013-08-02 DIAGNOSIS — E785 Hyperlipidemia, unspecified: Secondary | ICD-10-CM

## 2013-08-02 DIAGNOSIS — I10 Essential (primary) hypertension: Secondary | ICD-10-CM

## 2013-08-02 DIAGNOSIS — Z0000 Encounter for general adult medical examination without abnormal findings: Secondary | ICD-10-CM

## 2013-08-02 DIAGNOSIS — F17200 Nicotine dependence, unspecified, uncomplicated: Secondary | ICD-10-CM

## 2013-08-02 DIAGNOSIS — Z124 Encounter for screening for malignant neoplasm of cervix: Secondary | ICD-10-CM

## 2013-08-02 LAB — LIPID PANEL
Cholesterol: 204 mg/dL — ABNORMAL HIGH (ref 0–200)
HDL: 39.7 mg/dL (ref >39.00)
Total CHOL/HDL Ratio: 5
Triglycerides: 127 mg/dL (ref 0.0–149.0)
VLDL: 25.4 mg/dL (ref 0.0–40.0)

## 2013-08-02 LAB — HEPATIC FUNCTION PANEL
ALT: 19 U/L (ref 0–35)
AST: 20 U/L (ref 0–37)
Albumin: 4.1 g/dL (ref 3.5–5.2)
Alkaline Phosphatase: 94 U/L (ref 39–117)
Bilirubin, Direct: 0.1 mg/dL (ref 0.0–0.3)
Total Bilirubin: 0.6 mg/dL (ref 0.3–1.2)
Total Protein: 7.6 g/dL (ref 6.0–8.3)

## 2013-08-02 LAB — LDL CHOLESTEROL, DIRECT: Direct LDL: 149.3 mg/dL

## 2013-08-02 LAB — CBC WITH DIFFERENTIAL/PLATELET
BASOS PCT: 0.5 % (ref 0.0–3.0)
Basophils Absolute: 0 10*3/uL (ref 0.0–0.1)
EOS ABS: 0.3 10*3/uL (ref 0.0–0.7)
EOS PCT: 3.1 % (ref 0.0–5.0)
HCT: 49 % — ABNORMAL HIGH (ref 36.0–46.0)
Hemoglobin: 16.1 g/dL — ABNORMAL HIGH (ref 12.0–15.0)
LYMPHS PCT: 20.9 % (ref 12.0–46.0)
Lymphs Abs: 2.1 10*3/uL (ref 0.7–4.0)
MCHC: 32.9 g/dL (ref 30.0–36.0)
MCV: 93.6 fl (ref 78.0–100.0)
MONO ABS: 0.3 10*3/uL (ref 0.1–1.0)
Monocytes Relative: 3.2 % (ref 3.0–12.0)
NEUTROS PCT: 72.3 % (ref 43.0–77.0)
Neutro Abs: 7.4 10*3/uL (ref 1.4–7.7)
PLATELETS: 243 10*3/uL (ref 150.0–400.0)
RBC: 5.23 Mil/uL — AB (ref 3.87–5.11)
RDW: 13.7 % (ref 11.5–14.6)
WBC: 10.2 10*3/uL (ref 4.5–10.5)

## 2013-08-02 LAB — BASIC METABOLIC PANEL
BUN: 8 mg/dL (ref 6–23)
CALCIUM: 9.2 mg/dL (ref 8.4–10.5)
CO2: 26 mEq/L (ref 19–32)
CREATININE: 0.6 mg/dL (ref 0.4–1.2)
Chloride: 104 mEq/L (ref 96–112)
GFR: 137.56 mL/min (ref >60.00)
Glucose, Bld: 96 mg/dL (ref 70–99)
Potassium: 3.3 mEq/L — ABNORMAL LOW (ref 3.5–5.1)
Sodium: 137 mEq/L (ref 135–145)

## 2013-08-02 LAB — POCT URINALYSIS DIPSTICK
Bilirubin, UA: NEGATIVE
Blood, UA: NEGATIVE
Glucose, UA: NEGATIVE
Ketones, UA: NEGATIVE
Leukocytes, UA: NEGATIVE
Nitrite, UA: NEGATIVE
Protein, UA: NEGATIVE
Spec Grav, UA: 1.01
Urobilinogen, UA: 0.2
pH, UA: 7

## 2013-08-02 LAB — TSH: TSH: 1.56 u[IU]/mL (ref 0.35–5.50)

## 2013-08-02 MED ORDER — ALBUTEROL SULFATE HFA 108 (90 BASE) MCG/ACT IN AERS: 2.0000 | INHALATION_SPRAY | Freq: Four times a day (QID) | RESPIRATORY_TRACT | Status: DC | PRN

## 2013-08-02 MED ORDER — HYDROCHLOROTHIAZIDE 12.5 MG PO CAPS: ORAL_CAPSULE | ORAL | Status: DC

## 2013-08-02 NOTE — Addendum Note (Signed)
Addended by: Arnette NorrisPAYNE, Keah Lamba P on: 08/02/2013 12:13 PM   Modules accepted: Orders

## 2013-08-02 NOTE — Progress Notes (Signed)
Subjective:     Kimberly Rollins is a 43 y.o. female and is here for a comprehensive physical exam. The patient reports no problems.  History   Social History  . Marital Status: Single    Spouse Name: N/A    Number of Children: N/A  . Years of Education: N/A   Occupational History  . 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 Smoking status: Current Every Day Smoker -- 1.00 packs/day for 20 years    Types: Cigarettes  . Smokeless tobacco: Current User    Types: Chew     Comment: program at work--pt will try  . Alcohol Use: Yes     Comment: Socially  . Drug Use: No  . Sexual Activity: Yes    Partners: Female   Other Topics Concern  . Not on file   Social History Narrative   Exercising-- no   Health Maintenance  Topic Date Due  . Influenza Vaccine  08/02/2014 (Originally 01/11/2013)  . Mammogram  06/28/2014  . Pap Smear  08/02/2016  . Tetanus/tdap  08/05/2016    The following portions of the patient's history were reviewed and updated as appropriate:  She  has a past medical history of Asthma; Hypertension; and Hyperlipidemia. She  does not have any pertinent problems on file. She  has past surgical history that includes Cesarean section (1998) and Breast reduction surgery. Her family history includes Diabetes in her father; Hypertension in her father. She  reports that she has been smoking Cigarettes.  She has a 20 pack-year smoking history. Her smokeless tobacco use includes Chew. She reports that she drinks alcohol. She reports that she does not use illicit drugs. She has a current medication list which includes the following prescription(s): albuterol, beclomethasone, hydrochlorothiazide, and potassium chloride sa. Current Outpatient Prescriptions on File Prior to Visit  Medication Sig Dispense Refill  . beclomethasone (QVAR) 40 MCG/ACT inhaler Inhale 2 puffs into the lungs 2 (two) times daily.  1 Inhaler  12  . potassium chloride SA  (K-DUR,KLOR-CON) 20 MEQ tablet Take 1 tablet (20 mEq total) by mouth daily.  30 tablet  0   No current facility-administered medications on file prior to visit.   She has No Known Allergies..  Review of Systems Review of Systems  Constitutional: Negative for activity change, appetite change and fatigue.  HENT: Negative for hearing loss, congestion, tinnitus and ear discharge.  dentist q8079m Eyes: Negative for visual disturbance (see optho q1y -- vision corrected to 20/20 with glasses).  Respiratory: Negative for cough, chest tightness and shortness of breath.   Cardiovascular: Negative for chest pain, palpitations and leg swelling.  Gastrointestinal: Negative for abdominal pain, diarrhea, constipation and abdominal distention.  Genitourinary: Negative for urgency, frequency, decreased urine volume and difficulty urinating.  Musculoskeletal: Negative for back pain, arthralgias and gait problem.  Skin: Negative for color change, pallor and rash.  Neurological: Negative for dizziness, light-headedness, numbness and headaches.  Hematological: Negative for adenopathy. Does not bruise/bleed easily.  Psychiatric/Behavioral: Negative for suicidal ideas, confusion, sleep disturbance, self-injury, dysphoric mood, decreased concentration and agitation.       Objective:    BP 156/94  Pulse 81  Temp(Src) 98.5 F (36.9 C) (Oral)  Wt 157 lb (71.215 kg)  SpO2 97%  LMP 07/21/2013 General appearance: alert, cooperative, appears stated age and no distress Head: Normocephalic, without obvious abnormality, atraumatic Eyes: conjunctivae/corneas clear. PERRL, EOM's intact. Fundi benign. Ears: normal TM's and external ear canals both ears Nose:  Nares normal. Septum midline. Mucosa normal. No drainage or sinus tenderness. Throat: lips, mucosa, and tongue normal; teeth and gums normal Neck: no adenopathy, no carotid bruit, no JVD, supple, symmetrical, trachea midline and thyroid not enlarged, symmetric,  no tenderness/mass/nodules Back: symmetric, no curvature. ROM normal. No CVA tenderness. Lungs: clear to auscultation bilaterally Breasts: normal appearance, no masses or tenderness Heart: regular rate and rhythm, S1, S2 normal, no murmur, click, rub or gallop Abdomen: soft, non-tender; bowel sounds normal; no masses,  no organomegaly Pelvic: cervix normal in appearance, external genitalia normal, no adnexal masses or tenderness, no cervical motion tenderness, rectovaginal septum normal, uterus normal size, shape, and consistency, vagina normal without discharge and pap Extremities: extremities normal, atraumatic, no cyanosis or edema Pulses: 2+ and symmetric Skin: Skin color, texture, turgor normal. No rashes or lesions Lymph nodes: Cervical, supraclavicular, and axillary nodes normal. Neurologic: Alert and oriented X 3, normal strength and tone. Normal symmetric reflexes. Normal coordination and gait Psych--no depression, no anxiety      Assessment:    Healthy female exam.      Plan:     ghm utd Check labs Pt refused flu shot See After Visit Summary for Counseling Recommendations

## 2013-08-02 NOTE — Progress Notes (Signed)
Pre visit review using our clinic review tool, if applicable. No additional management support is needed unless otherwise documented below in the visit note. 

## 2013-08-02 NOTE — Patient Instructions (Signed)

## 2013-08-02 NOTE — Assessment & Plan Note (Signed)
Stable con't meds 

## 2013-08-02 NOTE — Assessment & Plan Note (Signed)
Check labs 

## 2013-08-05 ENCOUNTER — Telehealth: Payer: Self-pay | Admitting: Family Medicine

## 2013-08-05 NOTE — Telephone Encounter (Signed)
Relevant patient education assigned to patient using Emmi. ° °

## 2013-08-06 ENCOUNTER — Other Ambulatory Visit: Payer: Self-pay

## 2013-08-06 MED ORDER — POTASSIUM CHLORIDE CRYS ER 20 MEQ PO TBCR: 40.0000 meq | EXTENDED_RELEASE_TABLET | Freq: Every day | ORAL | Status: DC

## 2013-09-23 ENCOUNTER — Emergency Department (HOSPITAL_COMMUNITY)
Admission: EM | Admit: 2013-09-23 | Discharge: 2013-09-23 | Disposition: A | Discharge status: Home / Self Care | Payer: BC Managed Care – PPO | Attending: Emergency Medicine | Admitting: Emergency Medicine

## 2013-09-23 ENCOUNTER — Encounter (HOSPITAL_COMMUNITY): Payer: Self-pay | Admitting: Emergency Medicine

## 2013-09-23 ENCOUNTER — Telehealth: Payer: Self-pay

## 2013-09-23 DIAGNOSIS — Z8639 Personal history of other endocrine, nutritional and metabolic disease: Secondary | ICD-10-CM | POA: Insufficient documentation

## 2013-09-23 DIAGNOSIS — M7989 Other specified soft tissue disorders: Secondary | ICD-10-CM | POA: Insufficient documentation

## 2013-09-23 DIAGNOSIS — J45909 Unspecified asthma, uncomplicated: Secondary | ICD-10-CM

## 2013-09-23 DIAGNOSIS — Z79899 Other long term (current) drug therapy: Secondary | ICD-10-CM | POA: Insufficient documentation

## 2013-09-23 DIAGNOSIS — Z7982 Long term (current) use of aspirin: Secondary | ICD-10-CM | POA: Insufficient documentation

## 2013-09-23 DIAGNOSIS — F17200 Nicotine dependence, unspecified, uncomplicated: Secondary | ICD-10-CM | POA: Insufficient documentation

## 2013-09-23 DIAGNOSIS — M25569 Pain in unspecified knee: Secondary | ICD-10-CM

## 2013-09-23 DIAGNOSIS — Z91040 Latex allergy status: Secondary | ICD-10-CM | POA: Insufficient documentation

## 2013-09-23 DIAGNOSIS — I10 Essential (primary) hypertension: Secondary | ICD-10-CM | POA: Insufficient documentation

## 2013-09-23 DIAGNOSIS — Z862 Personal history of diseases of the blood and blood-forming organs and certain disorders involving the immune mechanism: Secondary | ICD-10-CM | POA: Insufficient documentation

## 2013-09-23 MED ORDER — IBUPROFEN 800 MG PO TABS: 800.0000 mg | ORAL_TABLET | Freq: Three times a day (TID) | ORAL | Status: DC

## 2013-09-23 NOTE — Progress Notes (Signed)
VASCULAR LAB PRELIMINARY  PRELIMINARY  PRELIMINARY  PRELIMINARY  Left lower extremity venous duplex completed.    Preliminary report: Left:  No evidence of DVT, superficial thrombosis, or Baker's cyst.  Kerrin ChampagneVirginia D Kmari Brian, RVS 09/23/2013, 6:38 PM

## 2013-09-23 NOTE — Telephone Encounter (Signed)
C/o left leg pain.  Left leg is slightly larger than right leg, however, client states that this is not new, but the pain is new.  Leg is "hard" and does not appear to be warmer than the right leg per patient.  However, she shares that she's worried that it may be a blood clot.  Patient was encouraged to go the ED.  Patient agreed with plan.  No further questions or concerns voiced.

## 2013-09-23 NOTE — Discharge Instructions (Signed)
Take up to 800mg  of ibuprofen three times a day for the next 3-4 days (take with food).  Apply heat or ice 2-3 times a day for 15-20 minutes.  Elevate when possible to decrease swelling and pain.  Activity as tolerated.  You may return to the ER if your pain worsens or you have any other concerns.

## 2013-09-23 NOTE — ED Notes (Signed)
Pt undressed from waist down.

## 2013-09-23 NOTE — ED Notes (Signed)
Pt in c/o pain and swelling to left calf since yesterday, no recent long trips, no redness noted, feels like area is tighter than the other calf and swollen

## 2013-09-23 NOTE — ED Provider Notes (Signed)
CSN: [id number]     Arrival date & time 09/23/13  1555 History  This chart was scribed for non-physician practitioner, Margaret PyleKatie SchinIver, PA-C working with Glynn OctaveStephen Rancour, MD by Luisa DagoPriscilla Tutu, ED scribe. This patient was seen in room TR08C/TR08C and the patient's care was started at 7:24 PM.     Chief Complaint  Patient presents with  . Leg Pain   The history is provided by the patient. No language interpreter was used.   HPI Comments: Virgil Benedictrisha Ticas is a 43 y.o. female with a history of hypertension and asthma presents to the Emergency Department complaining of gradual onset worsening left calf swelling that started 1 day ago. Pt is also complaining of associated pain. She states that her left calf feels "tighter" than her right one. Pt is concerned that she may have a DVT to that leg. She states that the pain is exacerbated by bearing weight and touch. Pt reports doing a lot of housework yesterday, including cleaning out the garage. Denies any fever, chills, skin changes, extremity paresthesias/weakness, SOB, or chest pain.   RF for PE include smoking.   PCP: Loreen FreudYvonne Lowne, DO  Past Medical History  Diagnosis Date  . Asthma     Seasonal  . Hypertension   . Hyperlipidemia    Past Surgical History  Procedure Laterality Date  . Cesarean section  1998  . Breast reduction surgery     Family History  Problem Relation Age of Onset  . Hypertension Father   . Diabetes Father    History  Substance Use Topics  . Smoking status: Current Every Day Smoker -- 1.00 packs/day for 20 years    Types: Cigarettes  . Smokeless tobacco: Current User    Types: Chew     Comment: program at work--pt will try  . Alcohol Use: Yes     Comment: Socially   OB History   Grav Para Term Preterm Abortions TAB SAB Ect Mult Living                 Review of Systems  Musculoskeletal: Positive for arthralgias (left leg) and joint swelling (left calf).  All other systems reviewed and are  negative.     Allergies  Latex  Home Medications   Current Outpatient Rx  Name  Route  Sig  Dispense  Refill  . albuterol (PROVENTIL HFA;VENTOLIN HFA) 108 (90 BASE) MCG/ACT inhaler   Inhalation   Inhale 2 puffs into the lungs every 6 (six) hours as needed for wheezing or shortness of breath.         Marland Kitchen. aspirin 81 MG chewable tablet   Oral   Chew 81 mg by mouth daily.         . hydrochlorothiazide (MICROZIDE) 12.5 MG capsule   Oral   Take 12.5 mg by mouth daily.         . potassium chloride SA (K-DUR,KLOR-CON) 20 MEQ tablet   Oral   Take 20 mEq by mouth daily.          BP 167/96  Pulse 96  Temp(Src) 98.1 F (36.7 C) (Oral)  Resp 20  Wt 156 lb (70.761 kg)  SpO2 100%  LMP 09/15/2013  Physical Exam  Nursing note and vitals reviewed. Constitutional: She is oriented to person, place, and time. She appears well-developed and well-nourished. No distress.  HENT:  Head: Normocephalic and atraumatic.  Eyes:  Normal appearance  Neck: Normal range of motion.  Pulmonary/Chest: Effort normal.  Musculoskeletal: Normal range of motion.  Mild edema L calf compared to R.  No overlying skin changes.  Ttp.  Achilles intact. 2+ DP pulse and distal sensation intact.    Neurological: She is alert and oriented to person, place, and time.  Psychiatric: She has a normal mood and affect. Her behavior is normal.    ED Course  Procedures (including critical care time)  DIAGNOSTIC STUDIES: Oxygen Saturation is 100% on RA, normal by my interpretation.    COORDINATION OF CARE: 7:24 PM- Will order a DVT test. Advised pt to rest her leg as much as possible and to follow up with her PCP. I also advised the pt to keep her foot elevated and apply cold compresses to the left leg. Pt advised of plan for treatment and pt agrees.  Labs Review Labs Reviewed - No data to display Imaging Review No results found.   EKG Interpretation None      MDM   Final diagnoses:  Asthma in  adult  Pain in joint, lower leg    42yo F presents w/ non-traumatic L lower leg edema/pain x 24 hours.  She is concerned she may have a DVT.  Exam sig for edema and tenderness of L calf, no skin changes or NV deficits.  Venous duplex neg for DVT.  I suspect that patient has a muscle strain; cleaned out garage prior to onset yesterday.  Recommended heat/ice, NSAID and rest.  Ortho tech provided w/ crutches.  Also recommended return to ER or f/u w/ PCP for worsening sx or CP/SOB.   I personally performed the services described in this documentation, which was scribed in my presence. The recorded information has been reviewed and is accurate.    Otilio Miuatherine E Banita Lehn, PA-C 09/24/13 [phone]

## 2013-09-24 NOTE — ED Provider Notes (Signed)
Medical screening examination/treatment/procedure(s) were performed by non-physician practitioner and as supervising physician I was immediately available for consultation/collaboration.   EKG Interpretation None        Neera Teng, MD 09/24/13 0157 

## 2013-12-18 ENCOUNTER — Other Ambulatory Visit: Payer: Self-pay | Admitting: Family Medicine

## 2013-12-18 DIAGNOSIS — J4540 Moderate persistent asthma, uncomplicated: Secondary | ICD-10-CM

## 2013-12-19 NOTE — Telephone Encounter (Signed)
[facility]

## 2014-04-14 ENCOUNTER — Ambulatory Visit: Payer: Self-pay

## 2014-04-14 ENCOUNTER — Other Ambulatory Visit: Payer: Self-pay | Admitting: Occupational Medicine

## 2014-04-14 DIAGNOSIS — S161XXA Strain of muscle, fascia and tendon at neck level, initial encounter: Secondary | ICD-10-CM

## 2014-04-26 ENCOUNTER — Other Ambulatory Visit: Payer: Self-pay | Admitting: Family Medicine

## 2014-05-02 ENCOUNTER — Ambulatory Visit (INDEPENDENT_AMBULATORY_CARE_PROVIDER_SITE_OTHER): Payer: BC Managed Care – PPO | Admitting: Family Medicine

## 2014-05-02 ENCOUNTER — Encounter: Payer: Self-pay | Admitting: Family Medicine

## 2014-05-02 VITALS — BP 148/90 | HR 66 | Temp 97.8°F | Wt 159.6 lb

## 2014-05-02 DIAGNOSIS — I10 Essential (primary) hypertension: Secondary | ICD-10-CM

## 2014-05-02 LAB — BASIC METABOLIC PANEL
BUN: 12 mg/dL (ref 6–23)
CO2: 27 mEq/L (ref 19–32)
Calcium: 9.5 mg/dL (ref 8.4–10.5)
Chloride: 103 mEq/L (ref 96–112)
Creatinine, Ser: 0.6 mg/dL (ref 0.4–1.2)
GFR: 139.72 mL/min (ref >60.00)
Glucose, Bld: 78 mg/dL (ref 70–99)
Potassium: 2.9 mEq/L — ABNORMAL LOW (ref 3.5–5.1)
SODIUM: 139 meq/L (ref 135–145)

## 2014-05-02 MED ORDER — LISINOPRIL 10 MG PO TABS: 10.0000 mg | ORAL_TABLET | Freq: Every day | ORAL | Status: DC

## 2014-05-02 MED ORDER — NONFORMULARY OR COMPOUNDED ITEM: Status: DC

## 2014-05-02 NOTE — Patient Instructions (Signed)

## 2014-05-02 NOTE — Progress Notes (Signed)
  Subjective:    Patient here for follow-up of elevated blood pressure.  She is not exercising and is adherent to a low-salt diet.  Blood pressure is not well controlled at home. Cardiac symptoms: none. Patient denies: chest pain, chest pressure/discomfort, claudication, dyspnea, exertional chest pressure/discomfort, fatigue, irregular heart beat, lower extremity edema, near-syncope, orthopnea, palpitations, paroxysmal nocturnal dyspnea, syncope and tachypnea. Cardiovascular risk factors: hypertension and sedentary lifestyle. Use of agents associated with hypertension: none. History of target organ damage: none.  The following portions of the patient's history were reviewed and updated as appropriate: allergies, current medications, past family history, past medical history, past social history, past surgical history and problem list.  Review of Systems Pertinent items are noted in HPI.     Objective:    BP 148/90 mmHg  Pulse 66  Temp(Src) 97.8 F (36.6 C) (Oral)  Wt 159 lb 9.6 oz (72.394 kg)  SpO2 98%  LMP 04/07/2014 (Exact Date) General appearance: alert, cooperative, appears stated age and no distress Throat: lips, mucosa, and tongue normal; teeth and gums normal Lungs: clear to auscultation bilaterally Heart: S1, S2 normal Extremities: extremities normal, atraumatic, no cyanosis or edema    Assessment:    Hypertension, uncontrolled  . Evidence of target organ damage: none.    Plan:    Medication: discontinue kcl and hctz and begin lisinopril 10 mg . Dietary sodium restriction. Check blood pressures 2-3 times weekly and record. Follow up: 2 weeks and as needed.    1. Essential hypertension - lisinopril (PRINIVIL,ZESTRIL) 10 MG tablet; Take 1 tablet (10 mg total) by mouth daily.  Dispense: 30 tablet; Refill: 2 - Basic metabolic panel - NONFORMULARY OR COMPOUNDED ITEM; bp cuff  #1  As directed  Dx htn  Dispense: 1 each; Refill: 0

## 2014-05-06 ENCOUNTER — Other Ambulatory Visit: Payer: Self-pay

## 2014-05-06 MED ORDER — POTASSIUM CHLORIDE CRYS ER 20 MEQ PO TBCR: 20.0000 meq | EXTENDED_RELEASE_TABLET | Freq: Every day | ORAL | Status: DC

## 2014-05-16 ENCOUNTER — Ambulatory Visit: Payer: BC Managed Care – PPO | Admitting: Family Medicine

## 2014-05-23 ENCOUNTER — Encounter: Payer: Self-pay | Admitting: Family Medicine

## 2014-05-23 ENCOUNTER — Ambulatory Visit (INDEPENDENT_AMBULATORY_CARE_PROVIDER_SITE_OTHER): Payer: BC Managed Care – PPO | Admitting: Family Medicine

## 2014-05-23 VITALS — BP 118/77 | HR 80 | Temp 98.4°F

## 2014-05-23 DIAGNOSIS — Z720 Tobacco use: Secondary | ICD-10-CM

## 2014-05-23 DIAGNOSIS — I10 Essential (primary) hypertension: Secondary | ICD-10-CM

## 2014-05-23 DIAGNOSIS — E876 Hypokalemia: Secondary | ICD-10-CM

## 2014-05-23 DIAGNOSIS — F17200 Nicotine dependence, unspecified, uncomplicated: Secondary | ICD-10-CM

## 2014-05-23 LAB — BASIC METABOLIC PANEL
BUN: 11 mg/dL (ref 6–23)
CALCIUM: 9.2 mg/dL (ref 8.4–10.5)
CO2: 20 mEq/L (ref 19–32)
CREATININE: 0.5 mg/dL (ref 0.4–1.2)
Chloride: 111 mEq/L (ref 96–112)
GFR: 157.74 mL/min (ref >60.00)
Glucose, Bld: 95 mg/dL (ref 70–99)
Potassium: 3.6 mEq/L (ref 3.5–5.1)
SODIUM: 138 meq/L (ref 135–145)

## 2014-05-23 NOTE — Progress Notes (Signed)
  Subjective:    Patient here for follow-up of elevated blood pressure.  She is not exercising and is adherent to a low-salt diet.  Blood pressure is well controlled at home. Cardiac symptoms: none. Patient denies: chest pain, chest pressure/discomfort, claudication, dyspnea, exertional chest pressure/discomfort, fatigue, irregular heart beat, lower extremity edema, near-syncope, orthopnea, palpitations, paroxysmal nocturnal dyspnea, syncope and tachypnea. Cardiovascular risk factors: hypertension, sedentary lifestyle and smoking/ tobacco exposure. Use of agents associated with hypertension: none. History of target organ damage: none.  We also need to recheck her potassium today The following portions of the patient's history were reviewed and updated as appropriate: allergies, current medications, past family history, past medical history, past social history, past surgical history and problem list.  Review of Systems Pertinent items are noted in HPI.     Objective:    BP 118/77 mmHg  Pulse 80  Temp(Src) 98.4 F (36.9 C) (Oral)  SpO2 97% General appearance: alert, cooperative, appears stated age and no distress Throat: lips, mucosa, and tongue normal; teeth and gums normal Neck: no adenopathy, no carotid bruit, no JVD, supple, symmetrical, trachea midline and thyroid not enlarged, symmetric, no tenderness/mass/nodules Lungs: clear to auscultation bilaterally Heart: S1, S2 normal Extremities: extremities normal, atraumatic, no cyanosis or edema    Assessment:    Hypertension, normal blood pressure . Evidence of target organ damage: none.    Plan:    Medication: no change. Dietary sodium restriction. Follow up: 3 months and as needed.   1. Essential hypertension   2. Tobacco use disorder   3. Hypokalemia Check labs - Basic metabolic panel

## 2014-05-23 NOTE — Patient Instructions (Signed)
Smoking Cessation, Tips for Success  If you are ready to quit smoking, congratulations! You have chosen to help yourself be healthier. Cigarettes bring nicotine, tar, carbon monoxide, and other irritants into your body. Your lungs, heart, and blood vessels will be able to work better without these poisons. There are many different ways to quit smoking. Nicotine gum, nicotine patches, a nicotine inhaler, or nicotine nasal spray can help with physical craving. Hypnosis, support groups, and medicines help break the habit of smoking.  WHAT THINGS CAN I DO TO MAKE QUITTING EASIER?   Here are some tips to help you quit for good:  · Pick a date when you will quit smoking completely. Tell all of your friends and family about your plan to quit on that date.  · Do not try to slowly cut down on the number of cigarettes you are smoking. Pick a quit date and quit smoking completely starting on that day.  · Throw away all cigarettes.    · Clean and remove all ashtrays from your home, work, and car.  · On a card, write down your reasons for quitting. Carry the card with you and read it when you get the urge to smoke.  · Cleanse your body of nicotine. Drink enough water and fluids to keep your urine clear or pale yellow. Do this after quitting to flush the nicotine from your body.  · Learn to predict your moods. Do not let a bad situation be your excuse to have a cigarette. Some situations in your life might tempt you into wanting a cigarette.  · Never have "just one" cigarette. It leads to wanting another and another. Remind yourself of your decision to quit.  · Change habits associated with smoking. If you smoked while driving or when feeling stressed, try other activities to replace smoking. Stand up when drinking your coffee. Brush your teeth after eating. Sit in a different chair when you read the paper. Avoid alcohol while trying to quit, and try to drink fewer caffeinated beverages. Alcohol and caffeine may urge you to  smoke.  · Avoid foods and drinks that can trigger a desire to smoke, such as sugary or spicy foods and alcohol.  · Ask people who smoke not to smoke around you.  · Have something planned to do right after eating or having a cup of coffee. For example, plan to take a walk or exercise.  · Try a relaxation exercise to calm you down and decrease your stress. Remember, you may be tense and nervous for the first 2 weeks after you quit, but this will pass.  · Find new activities to keep your hands busy. Play with a pen, coin, or rubber band. Doodle or draw things on paper.  · Brush your teeth right after eating. This will help cut down on the craving for the taste of tobacco after meals. You can also try mouthwash.    · Use oral substitutes in place of cigarettes. Try using lemon drops, carrots, cinnamon sticks, or chewing gum. Keep them handy so they are available when you have the urge to smoke.  · When you have the urge to smoke, try deep breathing.  · Designate your home as a nonsmoking area.  · If you are a heavy smoker, ask your health care provider about a prescription for nicotine chewing gum. It can ease your withdrawal from nicotine.  · Reward yourself. Set aside the cigarette money you save and buy yourself something nice.  · Look for   support from others. Join a support group or smoking cessation program. Ask someone at home or at work to help you with your plan to quit smoking.  · Always ask yourself, "Do I need this cigarette or is this just a reflex?" Tell yourself, "Today, I choose not to smoke," or "I do not want to smoke." You are reminding yourself of your decision to quit.  · Do not replace cigarette smoking with electronic cigarettes (commonly called e-cigarettes). The safety of e-cigarettes is unknown, and some may contain harmful chemicals.  · If you relapse, do not give up! Plan ahead and think about what you will do the next time you get the urge to smoke.  HOW WILL I FEEL WHEN I QUIT SMOKING?  You  may have symptoms of withdrawal because your body is used to nicotine (the addictive substance in cigarettes). You may crave cigarettes, be irritable, feel very hungry, cough often, get headaches, or have difficulty concentrating. The withdrawal symptoms are only temporary. They are strongest when you first quit but will go away within 10-14 days. When withdrawal symptoms occur, stay in control. Think about your reasons for quitting. Remind yourself that these are signs that your body is healing and getting used to being without cigarettes. Remember that withdrawal symptoms are easier to treat than the major diseases that smoking can cause.   Even after the withdrawal is over, expect periodic urges to smoke. However, these cravings are generally short lived and will go away whether you smoke or not. Do not smoke!  WHAT RESOURCES ARE AVAILABLE TO HELP ME QUIT SMOKING?  Your health care provider can direct you to community resources or hospitals for support, which may include:  · Group support.  · Education.  · Hypnosis.  · Therapy.  Document Released: 02/26/2004 Document Revised: 10/14/2013 Document Reviewed: 11/15/2012  [employer]® Patient Information ©2015 [employer]. This information is not intended to replace advice given to you by your health care provider. Make sure you discuss any questions you have with your health care provider.

## 2014-05-23 NOTE — Progress Notes (Signed)
Pre visit review using our clinic review tool, if applicable. No additional management support is needed unless otherwise documented below in the visit note. 

## 2014-05-28 ENCOUNTER — Other Ambulatory Visit: Payer: Self-pay

## 2014-05-28 MED ORDER — POTASSIUM CHLORIDE CRYS ER 20 MEQ PO TBCR: 10.0000 meq | EXTENDED_RELEASE_TABLET | Freq: Every day | ORAL | Status: DC

## 2014-06-17 ENCOUNTER — Other Ambulatory Visit: Payer: Self-pay | Admitting: Family Medicine

## 2014-06-17 DIAGNOSIS — R921 Mammographic calcification found on diagnostic imaging of breast: Secondary | ICD-10-CM

## 2014-07-01 ENCOUNTER — Ambulatory Visit
Admission: RE | Admit: 2014-07-01 | Discharge: 2014-07-01 | Disposition: A | Discharge status: Home / Self Care | Payer: BLUE CROSS/BLUE SHIELD | Source: Ambulatory Visit | Attending: Family Medicine | Admitting: Family Medicine

## 2014-07-01 DIAGNOSIS — R921 Mammographic calcification found on diagnostic imaging of breast: Secondary | ICD-10-CM

## 2014-08-04 ENCOUNTER — Ambulatory Visit (INDEPENDENT_AMBULATORY_CARE_PROVIDER_SITE_OTHER): Payer: BLUE CROSS/BLUE SHIELD | Admitting: Family Medicine

## 2014-08-04 ENCOUNTER — Encounter: Payer: Self-pay | Admitting: Family Medicine

## 2014-08-04 ENCOUNTER — Other Ambulatory Visit (HOSPITAL_COMMUNITY)
Admission: RE | Admit: 2014-08-04 | Discharge: 2014-08-04 | Disposition: A | Discharge status: Home / Self Care | Payer: BLUE CROSS/BLUE SHIELD | Source: Ambulatory Visit | Attending: Family Medicine | Admitting: Family Medicine

## 2014-08-04 VITALS — BP 130/92 | HR 71 | Temp 98.1°F | Ht 63.0 in | Wt 155.8 lb

## 2014-08-04 DIAGNOSIS — Z01419 Encounter for gynecological examination (general) (routine) without abnormal findings: Secondary | ICD-10-CM | POA: Diagnosis not present

## 2014-08-04 DIAGNOSIS — E876 Hypokalemia: Secondary | ICD-10-CM

## 2014-08-04 DIAGNOSIS — Z124 Encounter for screening for malignant neoplasm of cervix: Secondary | ICD-10-CM

## 2014-08-04 DIAGNOSIS — Z91048 Other nonmedicinal substance allergy status: Secondary | ICD-10-CM

## 2014-08-04 DIAGNOSIS — Z1151 Encounter for screening for human papillomavirus (HPV): Secondary | ICD-10-CM | POA: Diagnosis present

## 2014-08-04 DIAGNOSIS — I10 Essential (primary) hypertension: Secondary | ICD-10-CM

## 2014-08-04 DIAGNOSIS — Z9109 Other allergy status, other than to drugs and biological substances: Secondary | ICD-10-CM

## 2014-08-04 DIAGNOSIS — Z720 Tobacco use: Secondary | ICD-10-CM | POA: Insufficient documentation

## 2014-08-04 DIAGNOSIS — J4540 Moderate persistent asthma, uncomplicated: Secondary | ICD-10-CM

## 2014-08-04 DIAGNOSIS — Z0000 Encounter for general adult medical examination without abnormal findings: Secondary | ICD-10-CM

## 2014-08-04 LAB — CBC WITH DIFFERENTIAL/PLATELET
BASOS PCT: 0.9 % (ref 0.0–3.0)
Basophils Absolute: 0.1 10*3/uL (ref 0.0–0.1)
EOS ABS: 0.3 10*3/uL (ref 0.0–0.7)
EOS PCT: 3.7 % (ref 0.0–5.0)
HCT: 47.8 % — ABNORMAL HIGH (ref 36.0–46.0)
Hemoglobin: 16.4 g/dL — ABNORMAL HIGH (ref 12.0–15.0)
LYMPHS ABS: 2.2 10*3/uL (ref 0.7–4.0)
Lymphocytes Relative: 23.1 % (ref 12.0–46.0)
MCHC: 34.2 g/dL (ref 30.0–36.0)
MCV: 91.2 fl (ref 78.0–100.0)
Monocytes Absolute: 0.3 10*3/uL (ref 0.1–1.0)
Monocytes Relative: 3.6 % (ref 3.0–12.0)
Neutro Abs: 6.5 10*3/uL (ref 1.4–7.7)
Neutrophils Relative %: 68.7 % (ref 43.0–77.0)
PLATELETS: 234 10*3/uL (ref 150.0–400.0)
RBC: 5.24 Mil/uL — ABNORMAL HIGH (ref 3.87–5.11)
RDW: 13.6 % (ref 11.5–15.5)
WBC: 9.4 10*3/uL (ref 4.0–10.5)

## 2014-08-04 LAB — POCT URINALYSIS DIPSTICK
BILIRUBIN UA: NEGATIVE
Blood, UA: NEGATIVE
Glucose, UA: NEGATIVE
Ketones, UA: NEGATIVE
Leukocytes, UA: NEGATIVE
NITRITE UA: NEGATIVE
PROTEIN UA: NEGATIVE
SPEC GRAV UA: 1.015
Urobilinogen, UA: 0.2
pH, UA: 6.5

## 2014-08-04 LAB — MICROALBUMIN / CREATININE URINE RATIO
CREATININE, U: 90.7 mg/dL
MICROALB UR: 2.8 mg/dL — AB (ref 0.0–1.9)
Microalb Creat Ratio: 3.1 mg/g (ref 0.0–30.0)

## 2014-08-04 LAB — HEPATIC FUNCTION PANEL
ALBUMIN: 4.2 g/dL (ref 3.5–5.2)
ALT: 13 U/L (ref 0–35)
AST: 13 U/L (ref 0–37)
Alkaline Phosphatase: 100 U/L (ref 39–117)
Bilirubin, Direct: 0.1 mg/dL (ref 0.0–0.3)
Total Bilirubin: 0.4 mg/dL (ref 0.2–1.2)
Total Protein: 7.3 g/dL (ref 6.0–8.3)

## 2014-08-04 LAB — BASIC METABOLIC PANEL
BUN: 10 mg/dL (ref 6–23)
CO2: 23 meq/L (ref 19–32)
Calcium: 9.7 mg/dL (ref 8.4–10.5)
Chloride: 109 mEq/L (ref 96–112)
Creatinine, Ser: 0.58 mg/dL (ref 0.40–1.20)
GFR: 145.12 mL/min (ref >60.00)
GLUCOSE: 85 mg/dL (ref 70–99)
POTASSIUM: 4 meq/L (ref 3.5–5.1)
SODIUM: 141 meq/L (ref 135–145)

## 2014-08-04 LAB — LIPID PANEL
Cholesterol: 196 mg/dL (ref 0–200)
HDL: 37.7 mg/dL — ABNORMAL LOW (ref >39.00)
LDL CALC: 137 mg/dL — AB (ref 0–99)
NONHDL: 158.3
Total CHOL/HDL Ratio: 5
Triglycerides: 105 mg/dL (ref 0.0–149.0)
VLDL: 21 mg/dL (ref 0.0–40.0)

## 2014-08-04 LAB — TSH: TSH: 1.29 u[IU]/mL (ref 0.35–4.50)

## 2014-08-04 MED ORDER — LISINOPRIL 20 MG PO TABS: 20.0000 mg | ORAL_TABLET | Freq: Every day | ORAL | Status: DC

## 2014-08-04 MED ORDER — LORATADINE 10 MG PO TABS
10.0000 mg | ORAL_TABLET | Freq: Every day | ORAL | Status: DC
Start: 2014-08-04 — End: 2016-10-17

## 2014-08-04 MED ORDER — ALBUTEROL SULFATE HFA 108 (90 BASE) MCG/ACT IN AERS: 2.0000 | INHALATION_SPRAY | Freq: Four times a day (QID) | RESPIRATORY_TRACT | Status: DC | PRN

## 2014-08-04 MED ORDER — POTASSIUM CHLORIDE CRYS ER 10 MEQ PO TBCR: 10.0000 meq | EXTENDED_RELEASE_TABLET | Freq: Every day | ORAL | Status: DC

## 2014-08-04 NOTE — Addendum Note (Signed)
Addended by: Arnette NorrisPAYNE, Audryanna Zurita P on: 08/04/2014 10:00 AM   Modules accepted: Orders

## 2014-08-04 NOTE — Progress Notes (Signed)
7ySubjective:     Kimberly Rollins is a 44 y.o. female and is here for a comprehensive physical exam. The patient reports no problems.  History   Social History  . Marital Status: Single    Spouse Name: N/A  . Number of Children: N/A  . Years of Education: N/A   Occupational History  . [employer]   Social History Main Topics  . Smoking status: Current Every Day Smoker -- 0.75 packs/day for 20 years    Types: Cigarettes  . Smokeless tobacco: Current User    Types: Chew     Comment: program at work--pt will try  . Alcohol Use: Yes     Comment: Socially  . Drug Use: No  . Sexual Activity:    Partners: Female   Other Topics Concern  . Not on file   Social History Narrative   Exercising-- no   Health Maintenance  Topic Date Due  . INFLUENZA VACCINE  01/12/2015  . MAMMOGRAM  07/02/2015  . PAP SMEAR  08/02/2016  . TETANUS/TDAP  08/05/2016    The following portions of the patient's history were reviewed and updated as appropriate:  She  has a past medical history of Asthma; Hypertension; and Hyperlipidemia. She  does not have any pertinent problems on file. She  has past surgical history that includes Cesarean section (1998) and Breast reduction surgery. Her family history includes Diabetes in her father; Hypertension in her father. She  reports that she has been smoking Cigarettes.  She has a 15 pack-year smoking history. Her smokeless tobacco use includes Chew. She reports that she drinks alcohol. She reports that she does not use illicit drugs. She has a current medication list which includes the following prescription(s): aspirin, ibuprofen, NONFORMULARY OR COMPOUNDED ITEM, proair hfa, lisinopril, and potassium chloride. Current Outpatient Prescriptions on File Prior to Visit  Medication Sig Dispense Refill  . aspirin 81 MG chewable tablet Chew 81 mg by mouth daily.    Marland Kitchen ibuprofen (ADVIL,MOTRIN) 800 MG tablet Take 1 tablet (800 mg total) by  mouth 3 (three) times daily. 12 tablet 0  . NONFORMULARY OR COMPOUNDED ITEM bp cuff  #1  As directed  Dx htn 1 each 0  . PROAIR HFA 108 (90 BASE) MCG/ACT inhaler INHALE 2 PUFFS BY MOUTH EVERY 6 HOURS AS NEEDED 8.5 g 0   No current facility-administered medications on file prior to visit.   She is allergic to latex..  Review of Systems Review of Systems  Constitutional: Negative for activity change, appetite change and fatigue.  HENT: Negative for hearing loss, congestion, tinnitus and ear discharge.  dentist q75m Eyes: Negative for visual disturbance (see optho q1y -- vision corrected to 20/20 with glasses).  Respiratory: Negative for cough, chest tightness and shortness of breath.   Cardiovascular: Negative for chest pain, palpitations and leg swelling.  Gastrointestinal: Negative for abdominal pain, diarrhea, constipation and abdominal distention.  Genitourinary: Negative for urgency, frequency, decreased urine volume and difficulty urinating.  Musculoskeletal: Negative for back pain, arthralgias and gait problem.  Skin: Negative for color change, pallor and rash.  Neurological: Negative for dizziness, light-headedness, numbness and headaches.  Hematological: Negative for adenopathy. Does not bruise/bleed easily.  Psychiatric/Behavioral: Negative for suicidal ideas, confusion, sleep disturbance, self-injury, dysphoric mood, decreased concentration and agitation.       Objective:    BP 130/92 mmHg  Pulse 71  Temp(Src) 98.1 F (36.7 C) (Oral)  Ht  (1.6 m)  Wt 155 lb 12.8  oz (70.67 kg)  BMI 27.61 kg/m2  SpO2 99%  LMP 07/29/2014 General appearance: alert, cooperative, appears stated age and no distress Head: Normocephalic, without obvious abnormality, atraumatic Eyes: negative findings: lids and lashes normal, conjunctivae and sclerae normal and pupils equal, round, reactive to light and accomodation Ears: normal TM's and external ear canals both ears Nose: Nares normal.  Septum midline. Mucosa normal. No drainage or sinus tenderness. Throat: lips, mucosa, and tongue normal; teeth and gums normal Neck: no adenopathy, supple, symmetrical, trachea midline and thyroid not enlarged, symmetric, no tenderness/mass/nodules Back: symmetric, no curvature. ROM normal. No CVA tenderness. Lungs: clear to auscultation bilaterally Breasts: normal appearance, no masses or tenderness Heart: regular rate and rhythm, S1, S2 normal, no murmur, click, rub or gallop Abdomen: soft, non-tender; bowel sounds normal; no masses,  no organomegaly Pelvic: cervix normal in appearance, external genitalia normal, no adnexal masses or tenderness, no cervical motion tenderness, rectovaginal septum normal, uterus normal size, shape, and consistency, vagina normal without discharge and still spotting from period,  pap done  Rectal-- heme neg brown stool Extremities: extremities normal, atraumatic, no cyanosis or edema Pulses: 2+ and symmetric Skin: Skin color, texture, turgor normal. No rashes or lesions Lymph nodes: Cervical, supraclavicular, and axillary nodes normal. Neurologic: Alert and oriented X 3, normal strength and tone. Normal symmetric reflexes. Normal coordination and gait Psych--no depression, no anxiety      Assessment:    Healthy female exam.      Plan:    ghm utd Check labs See After Visit Summary for Counseling Recommendations   1. Essential hypertension Stable, con't lisinopril  - lisinopril (PRINIVIL,ZESTRIL) 20 MG tablet; Take 1 tablet (20 mg total) by mouth daily.  Dispense: 90 tablet; Refill: 3 - Basic metabolic panel - CBC with Differential/Platelet - Hepatic function panel - POCT urinalysis dipstick - Microalbumin / creatinine urine ratio  2. Hypokalemia   - potassium chloride (K-DUR,KLOR-CON) 10 MEQ tablet; Take 1 tablet (10 mEq total) by mouth daily.  Dispense: 90 tablet; Refill: 3 - Basic metabolic panel  3. Preventative health care  - Basic  metabolic panel - CBC with Differential/Platelet - Hepatic function panel - Lipid panel - POCT urinalysis dipstick - TSH - Microalbumin / creatinine urine ratio  4. Environmental allergies  - loratadine (CLARITIN) 10 MG tablet; Take 1 tablet (10 mg total) by mouth daily.  Dispense: 30 tablet; Refill: 11  5. Asthma, chronic, moderate persistent, uncomplicated Stable, con't proair , claritin prn - albuterol (PROAIR HFA) 108 (90 BASE) MCG/ACT inhaler; Inhale 2 puffs into the lungs every 6 (six) hours as needed.  Dispense: 8.5 g; Refill: 2  6. Tobacco abuse Pt is slowly cutting down States she will try the patch

## 2014-08-04 NOTE — Patient Instructions (Signed)
Preventive Care for Adults A healthy lifestyle and preventive care can promote health and wellness. Preventive health guidelines for women include the following key practices.  A routine yearly physical is a good way to check with your health care provider about your health and preventive screening. It is a chance to share any concerns and updates on your health and to receive a thorough exam.  Visit your dentist for a routine exam and preventive care every 6 months. Brush your teeth twice a day and floss once a day. Good oral hygiene prevents tooth decay and gum disease.  The frequency of eye exams is based on your age, health, family medical history, use of contact lenses, and other factors. Follow your health care provider's recommendations for frequency of eye exams.  Eat a healthy diet. Foods like vegetables, fruits, whole grains, low-fat dairy products, and lean protein foods contain the nutrients you need without too many calories. Decrease your intake of foods high in solid fats, added sugars, and salt. Eat the right amount of calories for you.Get information about a proper diet from your health care provider, if necessary.  Regular physical exercise is one of the most important things you can do for your health. Most adults should get at least 150 minutes of moderate-intensity exercise (any activity that increases your heart rate and causes you to sweat) each week. In addition, most adults need muscle-strengthening exercises on 2 or more days a week.  Maintain a healthy weight. The body mass index (BMI) is a screening tool to identify possible weight problems. It provides an estimate of body fat based on height and weight. Your health care provider can find your BMI and can help you achieve or maintain a healthy weight.For adults 20 years and older:  A BMI below 18.5 is considered underweight.  A BMI of 18.5 to 24.9 is normal.  A BMI of 25 to 29.9 is considered overweight.  A BMI of  30 and above is considered obese.  Maintain normal blood lipids and cholesterol levels by exercising and minimizing your intake of saturated fat. Eat a balanced diet with plenty of fruit and vegetables. Blood tests for lipids and cholesterol should begin at age 76 and be repeated every 5 years. If your lipid or cholesterol levels are high, you are over 50, or you are at high risk for heart disease, you may need your cholesterol levels checked more frequently.Ongoing high lipid and cholesterol levels should be treated with medicines if diet and exercise are not working.  If you smoke, find out from your health care provider how to quit. If you do not use tobacco, do not start.  Lung cancer screening is recommended for adults aged 22-80 years who are at high risk for developing lung cancer because of a history of smoking. A yearly low-dose CT scan of the lungs is recommended for people who have at least a 30-pack-year history of smoking and are a current smoker or have quit within the past 15 years. A pack year of smoking is smoking an average of 1 pack of cigarettes a day for 1 year (for example: 1 pack a day for 30 years or 2 packs a day for 15 years). Yearly screening should continue until the smoker has stopped smoking for at least 15 years. Yearly screening should be stopped for people who develop a health problem that would prevent them from having lung cancer treatment.  If you are pregnant, do not drink alcohol. If you are breastfeeding,  be very cautious about drinking alcohol. If you are not pregnant and choose to drink alcohol, do not have more than 1 drink per day. One drink is considered to be 12 ounces (355 mL) of beer, 5 ounces (148 mL) of wine, or 1.5 ounces (44 mL) of liquor.  Avoid use of street drugs. Do not share needles with anyone. Ask for help if you need support or instructions about stopping the use of drugs.  High blood pressure causes heart disease and increases the risk of  stroke. Your blood pressure should be checked at least every 1 to 2 years. Ongoing high blood pressure should be treated with medicines if weight loss and exercise do not work.  If you are 75-52 years old, ask your health care provider if you should take aspirin to prevent strokes.  Diabetes screening involves taking a blood sample to check your fasting blood sugar level. This should be done once every 3 years, after age 15, if you are within normal weight and without risk factors for diabetes. Testing should be considered at a younger age or be carried out more frequently if you are overweight and have at least 1 risk factor for diabetes.  Breast cancer screening is essential preventive care for women. You should practice "breast self-awareness." This means understanding the normal appearance and feel of your breasts and may include breast self-examination. Any changes detected, no matter how small, should be reported to a health care provider. Women in their 58s and 30s should have a clinical breast exam (CBE) by a health care provider as part of a regular health exam every 1 to 3 years. After age 16, women should have a CBE every year. Starting at age 53, women should consider having a mammogram (breast X-ray test) every year. Women who have a family history of breast cancer should talk to their health care provider about genetic screening. Women at a high risk of breast cancer should talk to their health care providers about having an MRI and a mammogram every year.  Breast cancer gene (BRCA)-related cancer risk assessment is recommended for women who have family members with BRCA-related cancers. BRCA-related cancers include breast, ovarian, tubal, and peritoneal cancers. Having family members with these cancers may be associated with an increased risk for harmful changes (mutations) in the breast cancer genes BRCA1 and BRCA2. Results of the assessment will determine the need for genetic counseling and  BRCA1 and BRCA2 testing.  Routine pelvic exams to screen for cancer are no longer recommended for nonpregnant women who are considered low risk for cancer of the pelvic organs (ovaries, uterus, and vagina) and who do not have symptoms. Ask your health care provider if a screening pelvic exam is right for you.  If you have had past treatment for cervical cancer or a condition that could lead to cancer, you need Pap tests and screening for cancer for at least 20 years after your treatment. If Pap tests have been discontinued, your risk factors (such as having a new sexual partner) need to be reassessed to determine if screening should be resumed. Some women have medical problems that increase the chance of getting cervical cancer. In these cases, your health care provider may recommend more frequent screening and Pap tests.  The HPV test is an additional test that may be used for cervical cancer screening. The HPV test looks for the virus that can cause the cell changes on the cervix. The cells collected during the Pap test can be  tested for HPV. The HPV test could be used to screen women aged 30 years and older, and should be used in women of any age who have unclear Pap test results. After the age of 30, women should have HPV testing at the same frequency as a Pap test.  Colorectal cancer can be detected and often prevented. Most routine colorectal cancer screening begins at the age of 50 years and continues through age 75 years. However, your health care provider may recommend screening at an earlier age if you have risk factors for colon cancer. On a yearly basis, your health care provider may provide home test kits to check for hidden blood in the stool. Use of a small camera at the end of a tube, to directly examine the colon (sigmoidoscopy or colonoscopy), can detect the earliest forms of colorectal cancer. Talk to your health care provider about this at age 50, when routine screening begins. Direct  exam of the colon should be repeated every 5-10 years through age 75 years, unless early forms of pre-cancerous polyps or small growths are found.  People who are at an increased risk for hepatitis B should be screened for this virus. You are considered at high risk for hepatitis B if:  You were born in a country where hepatitis B occurs often. Talk with your health care provider about which countries are considered high risk.  Your parents were born in a high-risk country and you have not received a shot to protect against hepatitis B (hepatitis B vaccine).  You have HIV or AIDS.  You use needles to inject street drugs.  You live with, or have sex with, someone who has hepatitis B.  You get hemodialysis treatment.  You take certain medicines for conditions like cancer, organ transplantation, and autoimmune conditions.  Hepatitis C blood testing is recommended for all people born from 1945 through 1965 and any individual with known risks for hepatitis C.  Practice safe sex. Use condoms and avoid high-risk sexual practices to reduce the spread of sexually transmitted infections (STIs). STIs include gonorrhea, chlamydia, syphilis, trichomonas, herpes, HPV, and human immunodeficiency virus (HIV). Herpes, HIV, and HPV are viral illnesses that have no cure. They can result in disability, cancer, and death.  You should be screened for sexually transmitted illnesses (STIs) including gonorrhea and chlamydia if:  You are sexually active and are younger than 24 years.  You are older than 24 years and your health care provider tells you that you are at risk for this type of infection.  Your sexual activity has changed since you were last screened and you are at an increased risk for chlamydia or gonorrhea. Ask your health care provider if you are at risk.  If you are at risk of being infected with HIV, it is recommended that you take a prescription medicine daily to prevent HIV infection. This is  called preexposure prophylaxis (PrEP). You are considered at risk if:  You are a heterosexual woman, are sexually active, and are at increased risk for HIV infection.  You take drugs by injection.  You are sexually active with a partner who has HIV.  Talk with your health care provider about whether you are at high risk of being infected with HIV. If you choose to begin PrEP, you should first be tested for HIV. You should then be tested every 3 months for as long as you are taking PrEP.  Osteoporosis is a disease in which the bones lose minerals and strength   with aging. This can result in serious bone fractures or breaks. The risk of osteoporosis can be identified using a bone density scan. Women ages 65 years and over and women at risk for fractures or osteoporosis should discuss screening with their health care providers. Ask your health care provider whether you should take a calcium supplement or vitamin D to reduce the rate of osteoporosis.  Menopause can be associated with physical symptoms and risks. Hormone replacement therapy is available to decrease symptoms and risks. You should talk to your health care provider about whether hormone replacement therapy is right for you.  Use sunscreen. Apply sunscreen liberally and repeatedly throughout the day. You should seek shade when your shadow is shorter than you. Protect yourself by wearing long sleeves, pants, a wide-brimmed hat, and sunglasses year round, whenever you are outdoors.  Once a month, do a whole body skin exam, using a mirror to look at the skin on your back. Tell your health care provider of new moles, moles that have irregular borders, moles that are larger than a pencil eraser, or moles that have changed in shape or color.  Stay current with required vaccines (immunizations).  Influenza vaccine. All adults should be immunized every year.  Tetanus, diphtheria, and acellular pertussis (Td, Tdap) vaccine. Pregnant women should  receive 1 dose of Tdap vaccine during each pregnancy. The dose should be obtained regardless of the length of time since the last dose. Immunization is preferred during the 27th-36th week of gestation. An adult who has not previously received Tdap or who does not know her vaccine status should receive 1 dose of Tdap. This initial dose should be followed by tetanus and diphtheria toxoids (Td) booster doses every 10 years. Adults with an unknown or incomplete history of completing a 3-dose immunization series with Td-containing vaccines should begin or complete a primary immunization series including a Tdap dose. Adults should receive a Td booster every 10 years.  Varicella vaccine. An adult without evidence of immunity to varicella should receive 2 doses or a second dose if she has previously received 1 dose. Pregnant females who do not have evidence of immunity should receive the first dose after pregnancy. This first dose should be obtained before leaving the health care facility. The second dose should be obtained 4-8 weeks after the first dose.  Human papillomavirus (HPV) vaccine. Females aged 13-26 years who have not received the vaccine previously should obtain the 3-dose series. The vaccine is not recommended for use in pregnant females. However, pregnancy testing is not needed before receiving a dose. If a female is found to be pregnant after receiving a dose, no treatment is needed. In that case, the remaining doses should be delayed until after the pregnancy. Immunization is recommended for any person with an immunocompromised condition through the age of 26 years if she did not get any or all doses earlier. During the 3-dose series, the second dose should be obtained 4-8 weeks after the first dose. The third dose should be obtained 24 weeks after the first dose and 16 weeks after the second dose.  Zoster vaccine. One dose is recommended for adults aged 60 years or older unless certain conditions are  present.  Measles, mumps, and rubella (MMR) vaccine. Adults born before 1957 generally are considered immune to measles and mumps. Adults born in 1957 or later should have 1 or more doses of MMR vaccine unless there is a contraindication to the vaccine or there is laboratory evidence of immunity to   each of the three diseases. A routine second dose of MMR vaccine should be obtained at least 28 days after the first dose for students attending postsecondary schools, health care workers, or international travelers. People who received inactivated measles vaccine or an unknown type of measles vaccine during 1963-1967 should receive 2 doses of MMR vaccine. People who received inactivated mumps vaccine or an unknown type of mumps vaccine before 1979 and are at high risk for mumps infection should consider immunization with 2 doses of MMR vaccine. For females of childbearing age, rubella immunity should be determined. If there is no evidence of immunity, females who are not pregnant should be vaccinated. If there is no evidence of immunity, females who are pregnant should delay immunization until after pregnancy. Unvaccinated health care workers born before 1957 who lack laboratory evidence of measles, mumps, or rubella immunity or laboratory confirmation of disease should consider measles and mumps immunization with 2 doses of MMR vaccine or rubella immunization with 1 dose of MMR vaccine.  Pneumococcal 13-valent conjugate (PCV13) vaccine. When indicated, a person who is uncertain of her immunization history and has no record of immunization should receive the PCV13 vaccine. An adult aged 19 years or older who has certain medical conditions and has not been previously immunized should receive 1 dose of PCV13 vaccine. This PCV13 should be followed with a dose of pneumococcal polysaccharide (PPSV23) vaccine. The PPSV23 vaccine dose should be obtained at least 8 weeks after the dose of PCV13 vaccine. An adult aged 19  years or older who has certain medical conditions and previously received 1 or more doses of PPSV23 vaccine should receive 1 dose of PCV13. The PCV13 vaccine dose should be obtained 1 or more years after the last PPSV23 vaccine dose.  Pneumococcal polysaccharide (PPSV23) vaccine. When PCV13 is also indicated, PCV13 should be obtained first. All adults aged 65 years and older should be immunized. An adult younger than age 65 years who has certain medical conditions should be immunized. Any person who resides in a nursing home or long-term care facility should be immunized. An adult smoker should be immunized. People with an immunocompromised condition and certain other conditions should receive both PCV13 and PPSV23 vaccines. People with human immunodeficiency virus (HIV) infection should be immunized as soon as possible after diagnosis. Immunization during chemotherapy or radiation therapy should be avoided. Routine use of PPSV23 vaccine is not recommended for American Indians, [place] Natives, or people younger than 65 years unless there are medical conditions that require PPSV23 vaccine. When indicated, people who have unknown immunization and have no record of immunization should receive PPSV23 vaccine. One-time revaccination 5 years after the first dose of PPSV23 is recommended for people aged 19-64 years who have chronic kidney failure, nephrotic syndrome, asplenia, or immunocompromised conditions. People who received 1-2 doses of PPSV23 before age 65 years should receive another dose of PPSV23 vaccine at age 65 years or later if at least 5 years have passed since the previous dose. Doses of PPSV23 are not needed for people immunized with PPSV23 at or after age 65 years.  Meningococcal vaccine. Adults with asplenia or persistent complement component deficiencies should receive 2 doses of quadrivalent meningococcal conjugate (MenACWY-D) vaccine. The doses should be obtained at least 2 months apart.  Microbiologists working with certain meningococcal bacteria, military recruits, people at risk during an outbreak, and people who travel to or live in countries with a high rate of meningitis should be immunized. A first-year college student up through age   21 years who is living in a residence hall should receive a dose if she did not receive a dose on or after her 16th birthday. Adults who have certain high-risk conditions should receive one or more doses of vaccine.  Hepatitis A vaccine. Adults who wish to be protected from this disease, have certain high-risk conditions, work with hepatitis A-infected animals, work in hepatitis A research labs, or travel to or work in countries with a high rate of hepatitis A should be immunized. Adults who were previously unvaccinated and who anticipate close contact with an international adoptee during the first 60 days after arrival in the [place] from a country with a high rate of hepatitis A should be immunized.  Hepatitis B vaccine. Adults who wish to be protected from this disease, have certain high-risk conditions, may be exposed to blood or other infectious body fluids, are household contacts or sex partners of hepatitis B positive people, are clients or workers in certain care facilities, or travel to or work in countries with a high rate of hepatitis B should be immunized.  Haemophilus influenzae type b (Hib) vaccine. A previously unvaccinated person with asplenia or sickle cell disease or having a scheduled splenectomy should receive 1 dose of Hib vaccine. Regardless of previous immunization, a recipient of a hematopoietic stem cell transplant should receive a 3-dose series 6-12 months after her successful transplant. Hib vaccine is not recommended for adults with HIV infection. Preventive Services / Frequency Ages 64 to 68 years  Blood pressure check.** / Every 1 to 2 years.  Lipid and cholesterol check.** / Every 5 years beginning at age  22.  Clinical breast exam.** / Every 3 years for women in their 88s and 53s.  BRCA-related cancer risk assessment.** / For women who have family members with a BRCA-related cancer (breast, ovarian, tubal, or peritoneal cancers).  Pap test.** / Every 2 years from ages 90 through 51. Every 3 years starting at age 21 through age 56 or 3 with a history of 3 consecutive normal Pap tests.  HPV screening.** / Every 3 years from ages 24 through ages 1 to 46 with a history of 3 consecutive normal Pap tests.  Hepatitis C blood test.** / For any individual with known risks for hepatitis C.  Skin self-exam. / Monthly.  Influenza vaccine. / Every year.  Tetanus, diphtheria, and acellular pertussis (Tdap, Td) vaccine.** / Consult your health care provider. Pregnant women should receive 1 dose of Tdap vaccine during each pregnancy. 1 dose of Td every 10 years.  Varicella vaccine.** / Consult your health care provider. Pregnant females who do not have evidence of immunity should receive the first dose after pregnancy.  HPV vaccine. / 3 doses over 6 months, if 72 and younger. The vaccine is not recommended for use in pregnant females. However, pregnancy testing is not needed before receiving a dose.  Measles, mumps, rubella (MMR) vaccine.** / You need at least 1 dose of MMR if you were born in 1957 or later. You may also need a 2nd dose. For females of childbearing age, rubella immunity should be determined. If there is no evidence of immunity, females who are not pregnant should be vaccinated. If there is no evidence of immunity, females who are pregnant should delay immunization until after pregnancy.  Pneumococcal 13-valent conjugate (PCV13) vaccine.** / Consult your health care provider.  Pneumococcal polysaccharide (PPSV23) vaccine.** / 1 to 2 doses if you smoke cigarettes or if you have certain conditions.  Meningococcal vaccine.** /  1 dose if you are age 19 to 21 years and a first-year college  student living in a residence hall, or have one of several medical conditions, you need to get vaccinated against meningococcal disease. You may also need additional booster doses.  Hepatitis A vaccine.** / Consult your health care provider.  Hepatitis B vaccine.** / Consult your health care provider.  Haemophilus influenzae type b (Hib) vaccine.** / Consult your health care provider. Ages 40 to 64 years  Blood pressure check.** / Every 1 to 2 years.  Lipid and cholesterol check.** / Every 5 years beginning at age 20 years.  Lung cancer screening. / Every year if you are aged 55-80 years and have a 30-pack-year history of smoking and currently smoke or have quit within the past 15 years. Yearly screening is stopped once you have quit smoking for at least 15 years or develop a health problem that would prevent you from having lung cancer treatment.  Clinical breast exam.** / Every year after age 40 years.  BRCA-related cancer risk assessment.** / For women who have family members with a BRCA-related cancer (breast, ovarian, tubal, or peritoneal cancers).  Mammogram.** / Every year beginning at age 40 years and continuing for as long as you are in good health. Consult with your health care provider.  Pap test.** / Every 3 years starting at age 30 years through age 65 or 70 years with a history of 3 consecutive normal Pap tests.  HPV screening.** / Every 3 years from ages 30 years through ages 65 to 70 years with a history of 3 consecutive normal Pap tests.  Fecal occult blood test (FOBT) of stool. / Every year beginning at age 50 years and continuing until age 75 years. You may not need to do this test if you get a colonoscopy every 10 years.  Flexible sigmoidoscopy or colonoscopy.** / Every 5 years for a flexible sigmoidoscopy or every 10 years for a colonoscopy beginning at age 50 years and continuing until age 75 years.  Hepatitis C blood test.** / For all people born from 1945 through  1965 and any individual with known risks for hepatitis C.  Skin self-exam. / Monthly.  Influenza vaccine. / Every year.  Tetanus, diphtheria, and acellular pertussis (Tdap/Td) vaccine.** / Consult your health care provider. Pregnant women should receive 1 dose of Tdap vaccine during each pregnancy. 1 dose of Td every 10 years.  Varicella vaccine.** / Consult your health care provider. Pregnant females who do not have evidence of immunity should receive the first dose after pregnancy.  Zoster vaccine.** / 1 dose for adults aged 60 years or older.  Measles, mumps, rubella (MMR) vaccine.** / You need at least 1 dose of MMR if you were born in 1957 or later. You may also need a 2nd dose. For females of childbearing age, rubella immunity should be determined. If there is no evidence of immunity, females who are not pregnant should be vaccinated. If there is no evidence of immunity, females who are pregnant should delay immunization until after pregnancy.  Pneumococcal 13-valent conjugate (PCV13) vaccine.** / Consult your health care provider.  Pneumococcal polysaccharide (PPSV23) vaccine.** / 1 to 2 doses if you smoke cigarettes or if you have certain conditions.  Meningococcal vaccine.** / Consult your health care provider.  Hepatitis A vaccine.** / Consult your health care provider.  Hepatitis B vaccine.** / Consult your health care provider.  Haemophilus influenzae type b (Hib) vaccine.** / Consult your health care provider. Ages 65   years and over  Blood pressure check.** / Every 1 to 2 years.  Lipid and cholesterol check.** / Every 5 years beginning at age 22 years.  Lung cancer screening. / Every year if you are aged 73-80 years and have a 30-pack-year history of smoking and currently smoke or have quit within the past 15 years. Yearly screening is stopped once you have quit smoking for at least 15 years or develop a health problem that would prevent you from having lung cancer  treatment.  Clinical breast exam.** / Every year after age 4 years.  BRCA-related cancer risk assessment.** / For women who have family members with a BRCA-related cancer (breast, ovarian, tubal, or peritoneal cancers).  Mammogram.** / Every year beginning at age 40 years and continuing for as long as you are in good health. Consult with your health care provider.  Pap test.** / Every 3 years starting at age 9 years through age 34 or 91 years with 3 consecutive normal Pap tests. Testing can be stopped between 65 and 70 years with 3 consecutive normal Pap tests and no abnormal Pap or HPV tests in the past 10 years.  HPV screening.** / Every 3 years from ages 57 years through ages 64 or 45 years with a history of 3 consecutive normal Pap tests. Testing can be stopped between 65 and 70 years with 3 consecutive normal Pap tests and no abnormal Pap or HPV tests in the past 10 years.  Fecal occult blood test (FOBT) of stool. / Every year beginning at age 15 years and continuing until age 17 years. You may not need to do this test if you get a colonoscopy every 10 years.  Flexible sigmoidoscopy or colonoscopy.** / Every 5 years for a flexible sigmoidoscopy or every 10 years for a colonoscopy beginning at age 86 years and continuing until age 71 years.  Hepatitis C blood test.** / For all people born from [birth year] through 1966 and any individual with known risks for hepatitis C.  Osteoporosis screening.** / A one-time screening for women ages 83 years and over and women at risk for fractures or osteoporosis.  Skin self-exam. / Monthly.  Influenza vaccine. / Every year.  Tetanus, diphtheria, and acellular pertussis (Tdap/Td) vaccine.** / 1 dose of Td every 10 years.  Varicella vaccine.** / Consult your health care provider.  Zoster vaccine.** / 1 dose for adults aged 61 years or older.  Pneumococcal 13-valent conjugate (PCV13) vaccine.** / Consult your health care provider.  Pneumococcal  polysaccharide (PPSV23) vaccine.** / 1 dose for all adults aged 28 years and older.  Meningococcal vaccine.** / Consult your health care provider.  Hepatitis A vaccine.** / Consult your health care provider.  Hepatitis B vaccine.** / Consult your health care provider.  Haemophilus influenzae type b (Hib) vaccine.** / Consult your health care provider. ** Family history and personal history of risk and conditions may change your health care provider's recommendations. Document Released: 07/26/2001 Document Revised: 10/14/2013 Document Reviewed: 10/25/2010 [facility] Patient Information 2015 [place], [place]. This information is not intended to replace advice given to you by your health care provider. Make sure you discuss any questions you have with your health care provider.

## 2014-08-04 NOTE — Progress Notes (Signed)
Pre visit review using our clinic review tool, if applicable. No additional management support is needed unless otherwise documented below in the visit note. 

## 2014-08-06 LAB — CYTOLOGY - PAP

## 2014-08-12 ENCOUNTER — Other Ambulatory Visit: Payer: Self-pay

## 2014-08-12 DIAGNOSIS — R809 Proteinuria, unspecified: Secondary | ICD-10-CM

## 2014-08-18 ENCOUNTER — Other Ambulatory Visit (INDEPENDENT_AMBULATORY_CARE_PROVIDER_SITE_OTHER): Payer: BLUE CROSS/BLUE SHIELD

## 2014-08-18 DIAGNOSIS — R809 Proteinuria, unspecified: Secondary | ICD-10-CM

## 2014-08-18 LAB — PROTEIN, URINE, 24 HOUR
Protein, 24H Urine: 72 mg/d (ref <150)
Protein, Urine: 12 mg/dL (ref 5–24)

## 2015-01-21 ENCOUNTER — Other Ambulatory Visit: Payer: Self-pay | Admitting: Family Medicine

## 2015-02-02 ENCOUNTER — Encounter: Payer: Self-pay | Admitting: Family Medicine

## 2015-02-02 ENCOUNTER — Ambulatory Visit (INDEPENDENT_AMBULATORY_CARE_PROVIDER_SITE_OTHER): Payer: BLUE CROSS/BLUE SHIELD | Admitting: Family Medicine

## 2015-02-02 VITALS — BP 128/94 | HR 68 | Temp 98.5°F | Resp 18 | Ht 62.0 in | Wt 155.4 lb

## 2015-02-02 DIAGNOSIS — E785 Hyperlipidemia, unspecified: Secondary | ICD-10-CM | POA: Diagnosis not present

## 2015-02-02 DIAGNOSIS — M7711 Lateral epicondylitis, right elbow: Secondary | ICD-10-CM | POA: Diagnosis not present

## 2015-02-02 DIAGNOSIS — I10 Essential (primary) hypertension: Secondary | ICD-10-CM | POA: Diagnosis not present

## 2015-02-02 DIAGNOSIS — J4530 Mild persistent asthma, uncomplicated: Secondary | ICD-10-CM

## 2015-02-02 LAB — COMPREHENSIVE METABOLIC PANEL
ALT: 11 U/L (ref 0–35)
AST: 12 U/L (ref 0–37)
Albumin: 4 g/dL (ref 3.5–5.2)
Alkaline Phosphatase: 91 U/L (ref 39–117)
BUN: 11 mg/dL (ref 6–23)
CHLORIDE: 110 meq/L (ref 96–112)
CO2: 22 mEq/L (ref 19–32)
CREATININE: 0.57 mg/dL (ref 0.40–1.20)
Calcium: 9.4 mg/dL (ref 8.4–10.5)
GFR: 147.72 mL/min (ref >60.00)
Glucose, Bld: 98 mg/dL (ref 70–99)
Potassium: 3.5 mEq/L (ref 3.5–5.1)
SODIUM: 141 meq/L (ref 135–145)
Total Bilirubin: 0.4 mg/dL (ref 0.2–1.2)
Total Protein: 7.1 g/dL (ref 6.0–8.3)

## 2015-02-02 LAB — LIPID PANEL
Cholesterol: 195 mg/dL (ref 0–200)
HDL: 33.2 mg/dL — ABNORMAL LOW (ref >39.00)
LDL CALC: 142 mg/dL — AB (ref 0–99)
NonHDL: 161.55
Total CHOL/HDL Ratio: 6
Triglycerides: 96 mg/dL (ref 0.0–149.0)
VLDL: 19.2 mg/dL (ref 0.0–40.0)

## 2015-02-02 MED ORDER — LISINOPRIL 10 MG PO TABS: 10.0000 mg | ORAL_TABLET | Freq: Every day | ORAL | Status: DC

## 2015-02-02 MED ORDER — BECLOMETHASONE DIPROPIONATE 40 MCG/ACT IN AERS: 2.0000 | INHALATION_SPRAY | Freq: Every day | RESPIRATORY_TRACT | Status: DC

## 2015-02-02 MED ORDER — AMLODIPINE BESYLATE 5 MG PO TABS: 5.0000 mg | ORAL_TABLET | Freq: Every day | ORAL | Status: DC

## 2015-02-02 NOTE — Assessment & Plan Note (Signed)
Check labs 

## 2015-02-02 NOTE — Patient Instructions (Signed)

## 2015-02-02 NOTE — Progress Notes (Signed)
Patient ID: Kimberly Rollins, female   DOB: 03/11/1971, 44 y.o.   MRN: [id number]   Subjective:    Patient ID: Kimberly Rollins, female    DOB: August 10, 1970, 44 y.o.   MRN: [id number]  Chief Complaint  Patient presents with  . Follow-up    6 mo. Also having right arm pain for 6 mos.  . Arm Pain    HPI Patient is in today for f/u bp and asthma.  She has been using her proair about 3 x a week.  She is not using the qvar-- she ran out.  No chest pain.  She is also c/o R elbow pain.  She is r handed and carries a lap top around with her all the time.   Past Medical History  Diagnosis Date  . Asthma     Seasonal  . Hypertension   . Hyperlipidemia     Past Surgical History  Procedure Laterality Date  . Cesarean section  1998  . Breast reduction surgery      Family History  Problem Relation Age of Onset  . Hypertension Father   . Diabetes Father     Social History   Social History  . Marital Status: Single    Spouse Name: N/A  . Number of Children: N/A  . Years of Education: N/A   Occupational History  . 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 Smoking status: Current Every Day Smoker -- 1.00 packs/day for 20 years    Types: Cigarettes  . Smokeless tobacco: Current User    Types: Chew     Comment: program at work--pt will try-- trying to cut down  . Alcohol Use: 0.0 oz/week    0 Standard drinks or equivalent per week     Comment: Socially  . Drug Use: No  . Sexual Activity:    Partners: Female   Other Topics Concern  . Not on file   Social History Narrative   Exercising-- no    Outpatient Prescriptions Prior to Visit  Medication Sig Dispense Refill  . aspirin 81 MG chewable tablet Chew 81 mg by mouth daily.    Marland Kitchen ibuprofen (ADVIL,MOTRIN) 800 MG tablet Take 1 tablet (800 mg total) by mouth 3 (three) times daily. 12 tablet 0  . loratadine (CLARITIN) 10 MG tablet Take 1 tablet (10 mg total) by mouth daily. 30 tablet 11  .  NONFORMULARY OR COMPOUNDED ITEM bp cuff  #1  As directed  Dx htn 1 each 0  . PROAIR HFA 108 (90 BASE) MCG/ACT inhaler INHALE 2 PUFFS INTO THE LUNGS EVERY 6 HOURS AS NEEDED 8.5 g 2  . lisinopril (PRINIVIL,ZESTRIL) 20 MG tablet Take 1 tablet (20 mg total) by mouth daily. 90 tablet 3  . potassium chloride (K-DUR,KLOR-CON) 10 MEQ tablet Take 1 tablet (10 mEq total) by mouth daily. 90 tablet 3   No facility-administered medications prior to visit.    Allergies  Allergen Reactions  . Latex Itching and Rash    Review of Systems  Constitutional: Negative for fever and malaise/fatigue.  HENT: Negative for congestion.   Eyes: Negative for discharge.  Respiratory: Negative for shortness of breath.   Cardiovascular: Negative for chest pain, palpitations and leg swelling.  Gastrointestinal: Negative for nausea and abdominal pain.  Genitourinary: Negative for dysuria.  Musculoskeletal: Positive for joint pain. Negative for falls.       Tennis elbow   Skin: Negative for rash.  Neurological: Negative for loss of consciousness  and headaches.  Endo/Heme/Allergies: Negative for environmental allergies.  Psychiatric/Behavioral: Negative for depression. The patient is not nervous/anxious.        Objective:    Physical Exam  Constitutional: She is oriented to person, place, and time. She appears well-developed and well-nourished.  HENT:  Head: Normocephalic and atraumatic.  Eyes: Conjunctivae and EOM are normal.  Neck: Normal range of motion. Neck supple. No JVD present. Carotid bruit is not present. No thyromegaly present.  Cardiovascular: Normal rate, regular rhythm and normal heart sounds.   No murmur heard. Pulmonary/Chest: Effort normal and breath sounds normal. No respiratory distress. She has no wheezes. She has no rales. She exhibits no tenderness.  Musculoskeletal: She exhibits tenderness. She exhibits no edema.       Right elbow: She exhibits normal range of motion, no swelling and no  effusion. Tenderness found. Lateral epicondyle tenderness noted.       Arms: Neurological: She is alert and oriented to person, place, and time.  Psychiatric: She has a normal mood and affect. Her behavior is normal.    BP 128/94 mmHg  Pulse 68  Temp(Src) 98.5 F (36.9 C) (Oral)  Resp 18  Ht  (1.575 m)  Wt 155 lb 6.4 oz (70.489 kg)  BMI 28.42 kg/m2  SpO2 98%  LMP 01/19/2015 Wt Readings from Last 3 Encounters:  02/02/15 155 lb 6.4 oz (70.489 kg)  08/04/14 155 lb 12.8 oz (70.67 kg)  05/02/14 159 lb 9.6 oz (72.394 kg)     Lab Results  Component Value Date   WBC 9.4 08/04/2014   HGB 16.4* 08/04/2014   HCT 47.8* 08/04/2014   PLT 234.0 08/04/2014   GLUCOSE 85 08/04/2014   CHOL 196 08/04/2014   TRIG 105.0 08/04/2014   HDL 37.70* 08/04/2014   LDLDIRECT 149.3 08/02/2013   LDLCALC 137* 08/04/2014   ALT 13 08/04/2014   AST 13 08/04/2014   NA 141 08/04/2014   K 4.0 08/04/2014   CL 109 08/04/2014   CREATININE 0.58 08/04/2014   BUN 10 08/04/2014   CO2 23 08/04/2014   TSH 1.29 08/04/2014   MICROALBUR 2.8* 08/04/2014    Lab Results  Component Value Date   TSH 1.29 08/04/2014   Lab Results  Component Value Date   WBC 9.4 08/04/2014   HGB 16.4* 08/04/2014   HCT 47.8* 08/04/2014   MCV 91.2 08/04/2014   PLT 234.0 08/04/2014   Lab Results  Component Value Date   NA 141 08/04/2014   K 4.0 08/04/2014   CO2 23 08/04/2014   GLUCOSE 85 08/04/2014   BUN 10 08/04/2014   CREATININE 0.58 08/04/2014   BILITOT 0.4 08/04/2014   ALKPHOS 100 08/04/2014   AST 13 08/04/2014   ALT 13 08/04/2014   PROT 7.3 08/04/2014   ALBUMIN 4.2 08/04/2014   CALCIUM 9.7 08/04/2014   GFR 145.12 08/04/2014   Lab Results  Component Value Date   CHOL 196 08/04/2014   Lab Results  Component Value Date   HDL 37.70* 08/04/2014   Lab Results  Component Value Date   LDLCALC 137* 08/04/2014   Lab Results  Component Value Date   TRIG 105.0 08/04/2014   Lab Results  Component Value  Date   CHOLHDL 5 08/04/2014   No results found for: HGBA1C     Assessment & Plan:   Problem List Items Addressed This Visit    Hyperlipidemia    Check labs      Relevant Medications   lisinopril (PRINIVIL,ZESTRIL) 10 MG  tablet   amLODipine (NORVASC) 5 MG tablet   HTN (hypertension) - Primary   Relevant Medications   lisinopril (PRINIVIL,ZESTRIL) 10 MG tablet   amLODipine (NORVASC) 5 MG tablet   Other Relevant Orders   Comprehensive metabolic panel   Lipid panel    Other Visit Diagnoses    Right tennis elbow        Asthma, chronic, mild persistent, uncomplicated        Relevant Medications    beclomethasone (QVAR) 40 MCG/ACT inhaler       I have discontinued Ms. Beynon's lisinopril and potassium chloride. I am also having her start on lisinopril, amLODipine, and beclomethasone. Additionally, I am having her maintain her aspirin, ibuprofen, NONFORMULARY OR COMPOUNDED ITEM, loratadine, and PROAIR HFA.  Meds ordered this encounter  Medications  . lisinopril (PRINIVIL,ZESTRIL) 10 MG tablet    Sig: Take 1 tablet (10 mg total) by mouth daily.    Dispense:  30 tablet    Refill:  5  . amLODipine (NORVASC) 5 MG tablet    Sig: Take 1 tablet (5 mg total) by mouth daily.    Dispense:  30 tablet    Refill:  5  . beclomethasone (QVAR) 40 MCG/ACT inhaler    Sig: Inhale 2 puffs into the lungs daily.    Dispense:  1 Inhaler    Refill:  12     Loreen Freud, DO

## 2015-02-02 NOTE — Progress Notes (Signed)
Pre visit review using our clinic review tool, if applicable. No additional management support is needed unless otherwise documented below in the visit note. 

## 2015-02-23 ENCOUNTER — Telehealth: Payer: Self-pay | Admitting: Family Medicine

## 2015-02-23 ENCOUNTER — Other Ambulatory Visit: Payer: Self-pay | Admitting: Family Medicine

## 2015-02-23 DIAGNOSIS — M25521 Pain in right elbow: Secondary | ICD-10-CM

## 2015-02-23 NOTE — Telephone Encounter (Signed)
Last ov 02/02/15. Please advise.

## 2015-02-23 NOTE — Telephone Encounter (Signed)
She was supposed to add norvasc to the lisinopril ---not stop lisinopril  Referral in

## 2015-02-23 NOTE — Telephone Encounter (Signed)
Relation to ZO:XWRU Call back [phone]   Reason for call:  Patient does not see any different in her BP and would like to go back to lisinopril 20 mg and requesting referral for right elbow pain.

## 2015-02-25 NOTE — Telephone Encounter (Signed)
Left a message for call back.  

## 2015-02-26 NOTE — Telephone Encounter (Signed)
Detailed message left making the patient aware to take both med's and to call me back to confirm receipt of the message or if any questions.      KP

## 2015-02-27 ENCOUNTER — Ambulatory Visit: Payer: BLUE CROSS/BLUE SHIELD | Admitting: Family Medicine

## 2015-02-27 NOTE — Telephone Encounter (Signed)
Another message left advising the patient to call if she has any questions.     KP

## 2015-03-09 ENCOUNTER — Encounter: Payer: Self-pay | Admitting: Family Medicine

## 2015-03-09 ENCOUNTER — Ambulatory Visit (INDEPENDENT_AMBULATORY_CARE_PROVIDER_SITE_OTHER): Payer: BLUE CROSS/BLUE SHIELD | Admitting: Family Medicine

## 2015-03-09 VITALS — BP 151/93 | HR 75 | Ht 63.0 in | Wt 158.0 lb

## 2015-03-09 DIAGNOSIS — M7711 Lateral epicondylitis, right elbow: Secondary | ICD-10-CM | POA: Insufficient documentation

## 2015-03-09 MED ORDER — NITROGLYCERIN 0.2 MG/HR TD PT24: MEDICATED_PATCH | TRANSDERMAL | Status: DC

## 2015-03-09 NOTE — Patient Instructions (Signed)
You have lateral epicondylitis Try to avoid painful activities as much as possible. Ice the area 3-4 times a day for 15 minutes at a time. Tylenol or aleve as needed for pain. Counterforce brace as directed can help unload area - wear this regularly if it provides you with relief. Hammer rotation exercise, wrist extension exercise with 1 pound weight - 3 sets of 10 once a day.   Stretching - hold for 20-30 seconds and repeat 3 times. Nitro patches 1/4th patch over affected area, change daily. Consider physical therapy, injection if not improving. Follow up in 6 weeks.

## 2015-03-11 NOTE — Progress Notes (Signed)
PCP and referred by: Loreen Freud, DO  Subjective:   HPI: Patient is a 44 y.o. female here for right elbow pain.  Patient reports she's had lateral right elbow pain for about 3 months. No known injury or trauma. Pain goes into forearm. No swelling or bruising. Taking  ibuprofen. Works as an Char[employer] fundraiser. Is right handed.  Past Medical History  Diagnosis Date  . Asthma     Seasonal  . Hypertension   . Hyperlipidemia     Current Outpatient Prescriptions on File Prior to Visit  Medication Sig Dispense Refill  . amLODipine (NORVASC) 5 MG tablet Take 1 tablet (5 mg total) by mouth daily. 30 tablet 5  . aspirin 81 MG chewable tablet Chew 81 mg by mouth daily.    . beclomethasone (QVAR) 40 MCG/ACT inhaler Inhale 2 puffs into the lungs daily. 1 Inhaler 12  . ibuprofen (ADVIL,MOTRIN) 800 MG tablet Take 1 tablet (800 mg total) by mouth 3 (three) times daily. 12 tablet 0  . lisinopril (PRINIVIL,ZESTRIL) 10 MG tablet Take 1 tablet (10 mg total) by mouth daily. 30 tablet 5  . loratadine (CLARITIN) 10 MG tablet Take 1 tablet (10 mg total) by mouth daily. 30 tablet 11  . NONFORMULARY OR COMPOUNDED ITEM bp cuff  #1  As directed  Dx htn 1 each 0  . PROAIR HFA 108 (90 BASE) MCG/ACT inhaler INHALE 2 PUFFS INTO THE LUNGS EVERY 6 HOURS AS NEEDED 8.5 g 2   No current facility-administered medications on file prior to visit.    Past Surgical History  Procedure Laterality Date  . Cesarean section  1998  . Breast reduction surgery      Allergies  Allergen Reactions  . Latex Itching and Rash    Social History   Social History  . Marital Status: Single    Spouse Name: N/A  . Number of Children: N/A  . Years of Education: N/A   Occupational History  . 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 Smoking status: Current Every Day Smoker -- 1.00 packs/day for 20 years    Types: Cigarettes  . Smokeless tobacco: Current User    Types: Chew     Comment: program at  work--pt will try-- trying to cut down  . Alcohol Use: 0.0 oz/week    0 Standard drinks or equivalent per week     Comment: Socially  . Drug Use: No  . Sexual Activity:    Partners: Female   Other Topics Concern  . Not on file   Social History Narrative   Exercising-- no    Family History  Problem Relation Age of Onset  . Hypertension Father   . Diabetes Father     BP 151/93 mmHg  Pulse 75  Ht  (1.6 m)  Wt 158 lb (71.668 kg)  BMI 28.00 kg/m2  Review of Systems: See HPI above.    Objective:  Physical Exam:  Gen: NAD  Right elbow: No gross deformity, swelling, bruising. TTP lateral epicondyle and just distal to this. FROM elbow.  Pain on resisted wrist extension, 3rd digit extension. Collateral ligaments intact. NVI distally.    Assessment & Plan:  1. Right lateral epicondylitis - shown home exercises to do daily.  Icing, tylenol, nsaids as needed.  Counterforce brace.  Start nitro patches - discussed risks of headache, skin irritation.  Consider PT, injection if not improving.  F/u in 6 weeks.

## 2015-03-11 NOTE — Assessment & Plan Note (Signed)
shown home exercises to do daily.  Icing, tylenol, nsaids as needed.  Counterforce brace.  Start nitro patches - discussed risks of headache, skin irritation.  Consider PT, injection if not improving.  F/u in 6 weeks.

## 2015-03-26 ENCOUNTER — Telehealth: Payer: Self-pay | Admitting: Family Medicine

## 2015-03-26 MED ORDER — LISINOPRIL 20 MG PO TABS: 20.0000 mg | ORAL_TABLET | Freq: Every day | ORAL | Status: DC

## 2015-03-26 NOTE — Telephone Encounter (Signed)
Rx was sent for 10 mg on error. Lisinopril 20 mg per Dr.Lowne. Rx faxed.        KP

## 2015-03-26 NOTE — Telephone Encounter (Signed)
Pt states she was changed back to the lisinopril 20mg . She is needing more sent to pharmacy.   [facility] [phone] - [place], [place] - [address]

## 2015-04-20 ENCOUNTER — Ambulatory Visit: Payer: Self-pay | Admitting: Family Medicine

## 2015-06-24 ENCOUNTER — Other Ambulatory Visit: Payer: Self-pay

## 2015-06-24 DIAGNOSIS — Z1231 Encounter for screening mammogram for malignant neoplasm of breast: Secondary | ICD-10-CM

## 2015-07-06 ENCOUNTER — Ambulatory Visit
Admission: RE | Admit: 2015-07-06 | Discharge: 2015-07-06 | Disposition: A | Discharge status: Home / Self Care | Payer: BLUE CROSS/BLUE SHIELD | Source: Ambulatory Visit

## 2015-07-06 DIAGNOSIS — Z1231 Encounter for screening mammogram for malignant neoplasm of breast: Secondary | ICD-10-CM

## 2015-08-10 ENCOUNTER — Telehealth: Payer: Self-pay | Admitting: Family Medicine

## 2015-08-10 NOTE — Telephone Encounter (Signed)
LM for pt to call and schedule flu shot or update records. °

## 2015-08-10 NOTE — Telephone Encounter (Signed)
Had Flu Shot done at [facility] in October 2016

## 2015-08-10 NOTE — Telephone Encounter (Signed)
Updated.      KP 

## 2015-09-04 ENCOUNTER — Other Ambulatory Visit: Payer: Self-pay | Admitting: Family Medicine

## 2015-10-16 ENCOUNTER — Encounter: Payer: Self-pay | Admitting: Family Medicine

## 2015-10-25 ENCOUNTER — Other Ambulatory Visit: Payer: Self-pay | Admitting: Family Medicine

## 2015-10-26 NOTE — Telephone Encounter (Signed)
Refilled patients rx request fot lisinopril #90 with 0rf and amplodipine #90 and o rf

## 2015-11-05 ENCOUNTER — Telehealth: Payer: Self-pay | Admitting: Family Medicine

## 2015-11-05 DIAGNOSIS — R300 Dysuria: Secondary | ICD-10-CM

## 2015-11-05 NOTE — Telephone Encounter (Signed)
Pt called in to request to speak with CMA. She says that she is your favorite pt so she expect a call back by close of business. (jokingly)   She says that she only have a question .    CB: [phone]

## 2015-11-06 ENCOUNTER — Other Ambulatory Visit (INDEPENDENT_AMBULATORY_CARE_PROVIDER_SITE_OTHER): Payer: BLUE CROSS/BLUE SHIELD

## 2015-11-06 DIAGNOSIS — R300 Dysuria: Secondary | ICD-10-CM

## 2015-11-06 LAB — POC URINALSYSI DIPSTICK (AUTOMATED)
BILIRUBIN UA: NEGATIVE
Blood, UA: NEGATIVE
Glucose, UA: NEGATIVE
KETONES UA: NEGATIVE
LEUKOCYTES UA: NEGATIVE
Nitrite, UA: NEGATIVE
Protein, UA: NEGATIVE
Urobilinogen, UA: 0.2
pH, UA: 6

## 2015-11-06 NOTE — Telephone Encounter (Signed)
The order is in and the patient is aware. She will stop by today.     KP

## 2015-11-06 NOTE — Telephone Encounter (Signed)
Spoke with patient and she stated she is having Frequency and Dysuira and wanted to know if she could come in a leave a urine.No fever and some flank pain. Patient is an Charity fundraiserN at [employer]. Please advise    KP

## 2015-11-06 NOTE — Telephone Encounter (Signed)
Ok to leave ua

## 2015-11-09 ENCOUNTER — Other Ambulatory Visit: Payer: Self-pay | Admitting: Family Medicine

## 2015-11-09 DIAGNOSIS — N390 Urinary tract infection, site not specified: Secondary | ICD-10-CM

## 2015-11-09 LAB — URINE CULTURE

## 2015-11-09 MED ORDER — CIPROFLOXACIN HCL 250 MG PO TABS: 250.0000 mg | ORAL_TABLET | Freq: Two times a day (BID) | ORAL | Status: DC

## 2016-01-21 ENCOUNTER — Ambulatory Visit (INDEPENDENT_AMBULATORY_CARE_PROVIDER_SITE_OTHER): Payer: BLUE CROSS/BLUE SHIELD | Admitting: Family Medicine

## 2016-01-21 ENCOUNTER — Other Ambulatory Visit (HOSPITAL_COMMUNITY)
Admission: RE | Admit: 2016-01-21 | Discharge: 2016-01-21 | Disposition: A | Discharge status: Home / Self Care | Payer: BLUE CROSS/BLUE SHIELD | Source: Ambulatory Visit | Attending: Family Medicine | Admitting: Family Medicine

## 2016-01-21 ENCOUNTER — Encounter: Payer: Self-pay | Admitting: Family Medicine

## 2016-01-21 VITALS — BP 122/86 | Temp 98.6°F | Ht 63.0 in | Wt 154.4 lb

## 2016-01-21 DIAGNOSIS — Z0000 Encounter for general adult medical examination without abnormal findings: Secondary | ICD-10-CM | POA: Diagnosis not present

## 2016-01-21 DIAGNOSIS — I10 Essential (primary) hypertension: Secondary | ICD-10-CM | POA: Diagnosis not present

## 2016-01-21 DIAGNOSIS — Z124 Encounter for screening for malignant neoplasm of cervix: Secondary | ICD-10-CM | POA: Diagnosis not present

## 2016-01-21 DIAGNOSIS — Z1151 Encounter for screening for human papillomavirus (HPV): Secondary | ICD-10-CM | POA: Diagnosis not present

## 2016-01-21 DIAGNOSIS — R319 Hematuria, unspecified: Secondary | ICD-10-CM | POA: Diagnosis not present

## 2016-01-21 DIAGNOSIS — Z113 Encounter for screening for infections with a predominantly sexual mode of transmission: Secondary | ICD-10-CM | POA: Insufficient documentation

## 2016-01-21 DIAGNOSIS — R82998 Other abnormal findings in urine: Secondary | ICD-10-CM

## 2016-01-21 DIAGNOSIS — N760 Acute vaginitis: Secondary | ICD-10-CM | POA: Diagnosis not present

## 2016-01-21 DIAGNOSIS — Z01419 Encounter for gynecological examination (general) (routine) without abnormal findings: Secondary | ICD-10-CM | POA: Diagnosis not present

## 2016-01-21 DIAGNOSIS — N390 Urinary tract infection, site not specified: Secondary | ICD-10-CM

## 2016-01-21 LAB — POCT URINALYSIS DIPSTICK
BILIRUBIN UA: NEGATIVE
GLUCOSE UA: NEGATIVE
Ketones, UA: NEGATIVE
Nitrite, UA: NEGATIVE
Protein, UA: NEGATIVE
UROBILINOGEN UA: 0.2
pH, UA: 5

## 2016-01-21 MED ORDER — AMLODIPINE BESYLATE 5 MG PO TABS: ORAL_TABLET | ORAL | 1 refills | Status: DC

## 2016-01-21 MED ORDER — LISINOPRIL 20 MG PO TABS: ORAL_TABLET | ORAL | 1 refills | Status: DC

## 2016-01-21 NOTE — Progress Notes (Signed)
Subjective:     Kimberly Rollins is a 45 y.o. female and is here for a comprehensive physical exam. The patient reports no problems.  Social History   Social History  . Marital status: Single    Spouse name: N/A  . Number of children: N/A  . Years of education: N/A   Occupational History  . 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 Smoking status: Current Every Day Smoker    Packs/day: 1.00    Years: 20.00    Types: Cigarettes  . Smokeless tobacco: Current User    Types: Chew     Comment: program at work--pt will try-- trying to cut down  . Alcohol use 0.0 oz/week     Comment: Socially  . Drug use: No  . Sexual activity: Yes    Partners: Female   Other Topics Concern  . Not on file   Social History Narrative   Exercising-- no   Health Maintenance  Topic Date Due  . HIV Screening  05/30/1985  . INFLUENZA VACCINE  01/12/2016  . MAMMOGRAM  07/05/2016  . TETANUS/TDAP  08/05/2016  . PAP SMEAR  08/04/2017    The following portions of the patient's history were reviewed and updated as appropriate: She  has a past medical history of Asthma; Hyperlipidemia; and Hypertension. She  does not have any pertinent problems on file. She  has a past surgical history that includes Cesarean section (1998) and Breast reduction surgery. Her family history includes Diabetes in her father; Hypertension in her father. She  reports that she has been smoking Cigarettes.  She has a 20.00 pack-year smoking history. Her smokeless tobacco use includes Chew. She reports that she drinks alcohol. She reports that she does not use drugs. She has a current medication list which includes the following prescription(s): amlodipine, aspirin, beclomethasone, ibuprofen, lisinopril, loratadine, nitroglycerin, NONFORMULARY OR COMPOUNDED ITEM, and proair hfa. Current Outpatient Prescriptions on File Prior to Visit  Medication Sig Dispense Refill  . aspirin 81 MG chewable tablet Chew  81 mg by mouth daily.    . beclomethasone (QVAR) 40 MCG/ACT inhaler Inhale 2 puffs into the lungs daily. 1 Inhaler 12  . ibuprofen (ADVIL,MOTRIN) 800 MG tablet Take 1 tablet (800 mg total) by mouth 3 (three) times daily. 12 tablet 0  . loratadine (CLARITIN) 10 MG tablet Take 1 tablet (10 mg total) by mouth daily. 30 tablet 11  . nitroGLYCERIN (NITRODUR - DOSED IN MG/24 HR) 0.2 mg/hr patch Apply 1/4th patch to affected area, change daily 30 patch 1  . NONFORMULARY OR COMPOUNDED ITEM bp cuff  #1  As directed  Dx htn 1 each 0  . PROAIR HFA 108 (90 Base) MCG/ACT inhaler INHALE 2 PUFFS INTO THE LUNGS EVERY 6 HOURS AS NEEDED 8.5 g 0   No current facility-administered medications on file prior to visit.    She is allergic to latex..  Review of Systems  Review of Systems  Constitutional: Negative for activity change, appetite change and fatigue.  HENT: Negative for hearing loss, congestion, tinnitus and ear discharge.  dentist [id number] Eyes: Negative for visual disturbance (see optho q1y -- vision corrected to 20/20 with glasses).  Respiratory: Negative for cough, chest tightness and shortness of breath.   Cardiovascular: Negative for chest pain, palpitations and leg swelling.  Gastrointestinal: Negative for abdominal pain, diarrhea, constipation and abdominal distention.  Genitourinary: Negative for urgency, frequency, decreased urine volume and difficulty urinating.  Musculoskeletal: Negative for back pain, arthralgias  and gait problem.  Skin: Negative for color change, pallor and rash.  Neurological: Negative for dizziness, light-headedness, numbness and headaches.  Hematological: Negative for adenopathy. Does not bruise/bleed easily.  Psychiatric/Behavioral: Negative for suicidal ideas, confusion, sleep disturbance, self-injury, dysphoric mood, decreased concentration and agitation.      Objective:    BP 122/86 (BP Location: Left Arm, Patient Position: Sitting, Cuff Size: Normal)   Temp 98.6  F (37 C) (Oral)   Ht  (1.6 m)   Wt 154 lb 6.4 oz (70 kg)   LMP 01/17/2016   BMI 27.35 kg/m  General appearance: alert, cooperative, appears stated age and no distress Head: Normocephalic, without obvious abnormality, atraumatic Eyes: conjunctivae/corneas clear. PERRL, EOM's intact. Fundi benign. Ears: normal TM's and external ear canals both ears Nose: Nares normal. Septum midline. Mucosa normal. No drainage or sinus tenderness. Throat: lips, mucosa, and tongue normal; teeth and gums normal Neck: no adenopathy, no carotid bruit, no JVD, supple, symmetrical, trachea midline and thyroid not enlarged, symmetric, no tenderness/mass/nodules Back: symmetric, no curvature. ROM normal. No CVA tenderness. Lungs: clear to auscultation bilaterally Breasts: normal appearance, no masses or tenderness Heart: regular rate and rhythm, S1, S2 normal, no murmur, click, rub or gallop Abdomen: soft, non-tender; bowel sounds normal; no masses,  no organomegaly Pelvic: cervix normal in appearance, external genitalia normal, no adnexal masses or tenderness, no cervical motion tenderness, rectovaginal septum normal, uterus normal size, shape, and consistency, vagina normal without discharge and + on period--- pap done Extremities: extremities normal, atraumatic, no cyanosis or edema Pulses: 2+ and symmetric Skin: Skin color, texture, turgor normal. No rashes or lesions Lymph nodes: Cervical, supraclavicular, and axillary nodes normal. Neurologic: Alert and oriented X 3, normal strength and tone. Normal symmetric reflexes. Normal coordination and gait    Assessment:    Healthy female exam.      Plan:     ghm utd See After Visit Summary for Counseling Recommendations   Check labs  1. Preventative health care See above - POCT urinalysis dipstick - CBC with Differential/Platelet - Lipid panel - POCT urinalysis dipstick - TSH - Cytology - PAP  2. Essential hypertension stable - lisinopril  (PRINIVIL,ZESTRIL) 20 MG tablet; TAKE 1 TABLET(20 MG) BY MOUTH DAILY  Dispense: 90 tablet; Refill: 1  3. Routine cervical smear    4. Screening for malignant neoplasm of cervix   - Cytology - PAP  5. Hematuria   - Urine culture  6. Leukocytes in urine   - Urine culture

## 2016-01-21 NOTE — Progress Notes (Signed)
Pre visit review using our clinic review tool, if applicable. No additional management support is needed unless otherwise documented below in the visit note. 

## 2016-01-21 NOTE — Patient Instructions (Signed)
Preventive Care for Adults, Female A healthy lifestyle and preventive care can promote health and wellness. Preventive health guidelines for women include the following key practices.  A routine yearly physical is a good way to check with your health care provider about your health and preventive screening. It is a chance to share any concerns and updates on your health and to receive a thorough exam.  Visit your dentist for a routine exam and preventive care every 6 months. Brush your teeth twice a day and floss once a day. Good oral hygiene prevents tooth decay and gum disease.  The frequency of eye exams is based on your age, health, family medical history, use of contact lenses, and other factors. Follow your health care provider's recommendations for frequency of eye exams.  Eat a healthy diet. Foods like vegetables, fruits, whole grains, low-fat dairy products, and lean protein foods contain the nutrients you need without too many calories. Decrease your intake of foods high in solid fats, added sugars, and salt. Eat the right amount of calories for you.Get information about a proper diet from your health care provider, if necessary.  Regular physical exercise is one of the most important things you can do for your health. Most adults should get at least 150 minutes of moderate-intensity exercise (any activity that increases your heart rate and causes you to sweat) each week. In addition, most adults need muscle-strengthening exercises on 2 or more days a week.  Maintain a healthy weight. The body mass index (BMI) is a screening tool to identify possible weight problems. It provides an estimate of body fat based on height and weight. Your health care provider can find your BMI and can help you achieve or maintain a healthy weight.For adults 20 years and older:  A BMI below 18.5 is considered underweight.  A BMI of 18.5 to 24.9 is normal.  A BMI of 25 to 29.9 is considered overweight.  A  BMI of 30 and above is considered obese.  Maintain normal blood lipids and cholesterol levels by exercising and minimizing your intake of saturated fat. Eat a balanced diet with plenty of fruit and vegetables. Blood tests for lipids and cholesterol should begin at age 45 and be repeated every 5 years. If your lipid or cholesterol levels are high, you are over 50, or you are at high risk for heart disease, you may need your cholesterol levels checked more frequently.Ongoing high lipid and cholesterol levels should be treated with medicines if diet and exercise are not working.  If you smoke, find out from your health care provider how to quit. If you do not use tobacco, do not start.  Lung cancer screening is recommended for adults aged 45-80 years who are at high risk for developing lung cancer because of a history of smoking. A yearly low-dose CT scan of the lungs is recommended for people who have at least a 30-pack-year history of smoking and are a current smoker or have quit within the past 15 years. A pack year of smoking is smoking an average of 1 pack of cigarettes a day for 1 year (for example: 1 pack a day for 30 years or 2 packs a day for 15 years). Yearly screening should continue until the smoker has stopped smoking for at least 15 years. Yearly screening should be stopped for people who develop a health problem that would prevent them from having lung cancer treatment.  If you are pregnant, do not drink alcohol. If you are  breastfeeding, be very cautious about drinking alcohol. If you are not pregnant and choose to drink alcohol, do not have more than 1 drink per day. One drink is considered to be 12 ounces (355 mL) of beer, 5 ounces (148 mL) of wine, or 1.5 ounces (44 mL) of liquor.  Avoid use of street drugs. Do not share needles with anyone. Ask for help if you need support or instructions about stopping the use of drugs.  High blood pressure causes heart disease and increases the risk  of stroke. Your blood pressure should be checked at least every 1 to 2 years. Ongoing high blood pressure should be treated with medicines if weight loss and exercise do not work.  If you are 55-79 years old, ask your health care provider if you should take aspirin to prevent strokes.  Diabetes screening is done by taking a blood sample to check your blood glucose level after you have not eaten for a certain period of time (fasting). If you are not overweight and you do not have risk factors for diabetes, you should be screened once every 3 years starting at age 45. If you are overweight or obese and you are 40-70 years of age, you should be screened for diabetes every year as part of your cardiovascular risk assessment.  Breast cancer screening is essential preventive care for women. You should practice "breast self-awareness." This means understanding the normal appearance and feel of your breasts and may include breast self-examination. Any changes detected, no matter how small, should be reported to a health care provider. Women in their 20s and 30s should have a clinical breast exam (CBE) by a health care provider as part of a regular health exam every 1 to 3 years. After age 40, women should have a CBE every year. Starting at age 40, women should consider having a mammogram (breast X-ray test) every year. Women who have a family history of breast cancer should talk to their health care provider about genetic screening. Women at a high risk of breast cancer should talk to their health care providers about having an MRI and a mammogram every year.  Breast cancer gene (BRCA)-related cancer risk assessment is recommended for women who have family members with BRCA-related cancers. BRCA-related cancers include breast, ovarian, tubal, and peritoneal cancers. Having family members with these cancers may be associated with an increased risk for harmful changes (mutations) in the breast cancer genes BRCA1 and  BRCA2. Results of the assessment will determine the need for genetic counseling and BRCA1 and BRCA2 testing.  Your health care provider may recommend that you be screened regularly for cancer of the pelvic organs (ovaries, uterus, and vagina). This screening involves a pelvic examination, including checking for microscopic changes to the surface of your cervix (Pap test). You may be encouraged to have this screening done every 3 years, beginning at age 21.  For women ages 30-65, health care providers may recommend pelvic exams and Pap testing every 3 years, or they may recommend the Pap and pelvic exam, combined with testing for human papilloma virus (HPV), every 5 years. Some types of HPV increase your risk of cervical cancer. Testing for HPV may also be done on women of any age with unclear Pap test results.  Other health care providers may not recommend any screening for nonpregnant women who are considered low risk for pelvic cancer and who do not have symptoms. Ask your health care provider if a screening pelvic exam is right for   you.  If you have had past treatment for cervical cancer or a condition that could lead to cancer, you need Pap tests and screening for cancer for at least 20 years after your treatment. If Pap tests have been discontinued, your risk factors (such as having a new sexual partner) need to be reassessed to determine if screening should resume. Some women have medical problems that increase the chance of getting cervical cancer. In these cases, your health care provider may recommend more frequent screening and Pap tests.  Colorectal cancer can be detected and often prevented. Most routine colorectal cancer screening begins at the age of 50 years and continues through age 75 years. However, your health care provider may recommend screening at an earlier age if you have risk factors for colon cancer. On a yearly basis, your health care provider may provide home test kits to check  for hidden blood in the stool. Use of a small camera at the end of a tube, to directly examine the colon (sigmoidoscopy or colonoscopy), can detect the earliest forms of colorectal cancer. Talk to your health care provider about this at age 50, when routine screening begins. Direct exam of the colon should be repeated every 5-10 years through age 75 years, unless early forms of precancerous polyps or small growths are found.  People who are at an increased risk for hepatitis B should be screened for this virus. You are considered at high risk for hepatitis B if:  You were born in a country where hepatitis B occurs often. Talk with your health care provider about which countries are considered high risk.  Your parents were born in a high-risk country and you have not received a shot to protect against hepatitis B (hepatitis B vaccine).  You have HIV or AIDS.  You use needles to inject street drugs.  You live with, or have sex with, someone who has hepatitis B.  You get hemodialysis treatment.  You take certain medicines for conditions like cancer, organ transplantation, and autoimmune conditions.  Hepatitis C blood testing is recommended for all people born from 1945 through 1965 and any individual with known risks for hepatitis C.  Practice safe sex. Use condoms and avoid high-risk sexual practices to reduce the spread of sexually transmitted infections (STIs). STIs include gonorrhea, chlamydia, syphilis, trichomonas, herpes, HPV, and human immunodeficiency virus (HIV). Herpes, HIV, and HPV are viral illnesses that have no cure. They can result in disability, cancer, and death.  You should be screened for sexually transmitted illnesses (STIs) including gonorrhea and chlamydia if:  You are sexually active and are younger than 24 years.  You are older than 24 years and your health care provider tells you that you are at risk for this type of infection.  Your sexual activity has changed  since you were last screened and you are at an increased risk for chlamydia or gonorrhea. Ask your health care provider if you are at risk.  If you are at risk of being infected with HIV, it is recommended that you take a prescription medicine daily to prevent HIV infection. This is called preexposure prophylaxis (PrEP). You are considered at risk if:  You are sexually active and do not regularly use condoms or know the HIV status of your partner(s).  You take drugs by injection.  You are sexually active with a partner who has HIV.  Talk with your health care provider about whether you are at high risk of being infected with HIV. If   you choose to begin PrEP, you should first be tested for HIV. You should then be tested every 3 months for as long as you are taking PrEP.  Osteoporosis is a disease in which the bones lose minerals and strength with aging. This can result in serious bone fractures or breaks. The risk of osteoporosis can be identified using a bone density scan. Women ages 67 years and over and women at risk for fractures or osteoporosis should discuss screening with their health care providers. Ask your health care provider whether you should take a calcium supplement or vitamin D to reduce the rate of osteoporosis.  Menopause can be associated with physical symptoms and risks. Hormone replacement therapy is available to decrease symptoms and risks. You should talk to your health care provider about whether hormone replacement therapy is right for you.  Use sunscreen. Apply sunscreen liberally and repeatedly throughout the day. You should seek shade when your shadow is shorter than you. Protect yourself by wearing long sleeves, pants, a wide-brimmed hat, and sunglasses year round, whenever you are outdoors.  Once a month, do a whole body skin exam, using a mirror to look at the skin on your back. Tell your health care provider of new moles, moles that have irregular borders, moles that  are larger than a pencil eraser, or moles that have changed in shape or color.  Stay current with required vaccines (immunizations).  Influenza vaccine. All adults should be immunized every year.  Tetanus, diphtheria, and acellular pertussis (Td, Tdap) vaccine. Pregnant women should receive 1 dose of Tdap vaccine during each pregnancy. The dose should be obtained regardless of the length of time since the last dose. Immunization is preferred during the 27th-36th week of gestation. An adult who has not previously received Tdap or who does not know her vaccine status should receive 1 dose of Tdap. This initial dose should be followed by tetanus and diphtheria toxoids (Td) booster doses every 10 years. Adults with an unknown or incomplete history of completing a 3-dose immunization series with Td-containing vaccines should begin or complete a primary immunization series including a Tdap dose. Adults should receive a Td booster every 10 years.  Varicella vaccine. An adult without evidence of immunity to varicella should receive 2 doses or a second dose if she has previously received 1 dose. Pregnant females who do not have evidence of immunity should receive the first dose after pregnancy. This first dose should be obtained before leaving the health care facility. The second dose should be obtained 4-8 weeks after the first dose.  Human papillomavirus (HPV) vaccine. Females aged 13-26 years who have not received the vaccine previously should obtain the 3-dose series. The vaccine is not recommended for use in pregnant females. However, pregnancy testing is not needed before receiving a dose. If a female is found to be pregnant after receiving a dose, no treatment is needed. In that case, the remaining doses should be delayed until after the pregnancy. Immunization is recommended for any person with an immunocompromised condition through the age of 61 years if she did not get any or all doses earlier. During the  3-dose series, the second dose should be obtained 4-8 weeks after the first dose. The third dose should be obtained 24 weeks after the first dose and 16 weeks after the second dose.  Zoster vaccine. One dose is recommended for adults aged 30 years or older unless certain conditions are present.  Measles, mumps, and rubella (MMR) vaccine. Adults born  before 1957 generally are considered immune to measles and mumps. Adults born in 1957 or later should have 1 or more doses of MMR vaccine unless there is a contraindication to the vaccine or there is laboratory evidence of immunity to each of the three diseases. A routine second dose of MMR vaccine should be obtained at least 28 days after the first dose for students attending postsecondary schools, health care workers, or international travelers. People who received inactivated measles vaccine or an unknown type of measles vaccine during 1963-1967 should receive 2 doses of MMR vaccine. People who received inactivated mumps vaccine or an unknown type of mumps vaccine before 1979 and are at high risk for mumps infection should consider immunization with 2 doses of MMR vaccine. For females of childbearing age, rubella immunity should be determined. If there is no evidence of immunity, females who are not pregnant should be vaccinated. If there is no evidence of immunity, females who are pregnant should delay immunization until after pregnancy. Unvaccinated health care workers born before 1957 who lack laboratory evidence of measles, mumps, or rubella immunity or laboratory confirmation of disease should consider measles and mumps immunization with 2 doses of MMR vaccine or rubella immunization with 1 dose of MMR vaccine.  Pneumococcal 13-valent conjugate (PCV13) vaccine. When indicated, a person who is uncertain of his immunization history and has no record of immunization should receive the PCV13 vaccine. All adults 65 years of age and older should receive this  vaccine. An adult aged 19 years or older who has certain medical conditions and has not been previously immunized should receive 1 dose of PCV13 vaccine. This PCV13 should be followed with a dose of pneumococcal polysaccharide (PPSV23) vaccine. Adults who are at high risk for pneumococcal disease should obtain the PPSV23 vaccine at least 8 weeks after the dose of PCV13 vaccine. Adults older than 46 years of age who have normal immune system function should obtain the PPSV23 vaccine dose at least 1 year after the dose of PCV13 vaccine.  Pneumococcal polysaccharide (PPSV23) vaccine. When PCV13 is also indicated, PCV13 should be obtained first. All adults aged 65 years and older should be immunized. An adult younger than age 65 years who has certain medical conditions should be immunized. Any person who resides in a nursing home or long-term care facility should be immunized. An adult smoker should be immunized. People with an immunocompromised condition and certain other conditions should receive both PCV13 and PPSV23 vaccines. People with human immunodeficiency virus (HIV) infection should be immunized as soon as possible after diagnosis. Immunization during chemotherapy or radiation therapy should be avoided. Routine use of PPSV23 vaccine is not recommended for American Indians, [place] Natives, or people younger than 65 years unless there are medical conditions that require PPSV23 vaccine. When indicated, people who have unknown immunization and have no record of immunization should receive PPSV23 vaccine. One-time revaccination 5 years after the first dose of PPSV23 is recommended for people aged 19-64 years who have chronic kidney failure, nephrotic syndrome, asplenia, or immunocompromised conditions. People who received 1-2 doses of PPSV23 before age 65 years should receive another dose of PPSV23 vaccine at age 65 years or later if at least 5 years have passed since the previous dose. Doses of PPSV23 are not  needed for people immunized with PPSV23 at or after age 65 years.  Meningococcal vaccine. Adults with asplenia or persistent complement component deficiencies should receive 2 doses of quadrivalent meningococcal conjugate (MenACWY-D) vaccine. The doses should be obtained   at least 2 months apart. Microbiologists working with certain meningococcal bacteria, [place] recruits, people at risk during an outbreak, and people who travel to or live in countries with a high rate of meningitis should be immunized. A first-year college student up through age 34 years who is living in a residence hall should receive a dose if she did not receive a dose on or after her 16th birthday. Adults who have certain high-risk conditions should receive one or more doses of vaccine.  Hepatitis A vaccine. Adults who wish to be protected from this disease, have certain high-risk conditions, work with hepatitis A-infected animals, work in hepatitis A research labs, or travel to or work in countries with a high rate of hepatitis A should be immunized. Adults who were previously unvaccinated and who anticipate close contact with an international adoptee during the first 60 days after arrival in the [place] from a country with a high rate of hepatitis A should be immunized.  Hepatitis B vaccine. Adults who wish to be protected from this disease, have certain high-risk conditions, may be exposed to blood or other infectious body fluids, are household contacts or sex partners of hepatitis B positive people, are clients or workers in certain care facilities, or travel to or work in countries with a high rate of hepatitis B should be immunized.  Haemophilus influenzae type b (Hib) vaccine. A previously unvaccinated person with asplenia or sickle cell disease or having a scheduled splenectomy should receive 1 dose of Hib vaccine. Regardless of previous immunization, a recipient of a hematopoietic stem cell transplant should receive a  3-dose series 6-12 months after her successful transplant. Hib vaccine is not recommended for adults with HIV infection. Preventive Services / Frequency Ages 35 to 4 years  Blood pressure check.** / Every 3-5 years.  Lipid and cholesterol check.** / Every 5 years beginning at age 60.  Clinical breast exam.** / Every 3 years for women in their 71s and 10s.  BRCA-related cancer risk assessment.** / For women who have family members with a BRCA-related cancer (breast, ovarian, tubal, or peritoneal cancers).  Pap test.** / Every 2 years from ages 76 through 26. Every 3 years starting at age 61 through age 76 or 93 with a history of 3 consecutive normal Pap tests.  HPV screening.** / Every 3 years from ages 37 through ages 60 to 51 with a history of 3 consecutive normal Pap tests.  Hepatitis C blood test.** / For any individual with known risks for hepatitis C.  Skin self-exam. / Monthly.  Influenza vaccine. / Every year.  Tetanus, diphtheria, and acellular pertussis (Tdap, Td) vaccine.** / Consult your health care provider. Pregnant women should receive 1 dose of Tdap vaccine during each pregnancy. 1 dose of Td every 10 years.  Varicella vaccine.** / Consult your health care provider. Pregnant females who do not have evidence of immunity should receive the first dose after pregnancy.  HPV vaccine. / 3 doses over 6 months, if 93 and younger. The vaccine is not recommended for use in pregnant females. However, pregnancy testing is not needed before receiving a dose.  Measles, mumps, rubella (MMR) vaccine.** / You need at least 1 dose of MMR if you were born in 1957 or later. You may also need a 2nd dose. For females of childbearing age, rubella immunity should be determined. If there is no evidence of immunity, females who are not pregnant should be vaccinated. If there is no evidence of immunity, females who are  pregnant should delay immunization until after pregnancy.  Pneumococcal  13-valent conjugate (PCV13) vaccine.** / Consult your health care provider.  Pneumococcal polysaccharide (PPSV23) vaccine.** / 1 to 2 doses if you smoke cigarettes or if you have certain conditions.  Meningococcal vaccine.** / 1 dose if you are age 68 to 8 years and a Market researcher living in a residence hall, or have one of several medical conditions, you need to get vaccinated against meningococcal disease. You may also need additional booster doses.  Hepatitis A vaccine.** / Consult your health care provider.  Hepatitis B vaccine.** / Consult your health care provider.  Haemophilus influenzae type b (Hib) vaccine.** / Consult your health care provider. Ages 7 to 53 years  Blood pressure check.** / Every year.  Lipid and cholesterol check.** / Every 5 years beginning at age 25 years.  Lung cancer screening. / Every year if you are aged 11-80 years and have a 30-pack-year history of smoking and currently smoke or have quit within the past 15 years. Yearly screening is stopped once you have quit smoking for at least 15 years or develop a health problem that would prevent you from having lung cancer treatment.  Clinical breast exam.** / Every year after age 48 years.  BRCA-related cancer risk assessment.** / For women who have family members with a BRCA-related cancer (breast, ovarian, tubal, or peritoneal cancers).  Mammogram.** / Every year beginning at age 41 years and continuing for as long as you are in good health. Consult with your health care provider.  Pap test.** / Every 3 years starting at age 65 years through age 37 or 70 years with a history of 3 consecutive normal Pap tests.  HPV screening.** / Every 3 years from ages 72 years through ages 60 to 40 years with a history of 3 consecutive normal Pap tests.  Fecal occult blood test (FOBT) of stool. / Every year beginning at age 21 years and continuing until age 5 years. You may not need to do this test if you get  a colonoscopy every 10 years.  Flexible sigmoidoscopy or colonoscopy.** / Every 5 years for a flexible sigmoidoscopy or every 10 years for a colonoscopy beginning at age 35 years and continuing until age 48 years.  Hepatitis C blood test.** / For all people born from [birth year] through 1965 and any individual with known risks for hepatitis C.  Skin self-exam. / Monthly.  Influenza vaccine. / Every year.  Tetanus, diphtheria, and acellular pertussis (Tdap/Td) vaccine.** / Consult your health care provider. Pregnant women should receive 1 dose of Tdap vaccine during each pregnancy. 1 dose of Td every 10 years.  Varicella vaccine.** / Consult your health care provider. Pregnant females who do not have evidence of immunity should receive the first dose after pregnancy.  Zoster vaccine.** / 1 dose for adults aged 30 years or older.  Measles, mumps, rubella (MMR) vaccine.** / You need at least 1 dose of MMR if you were born in 1957 or later. You may also need a second dose. For females of childbearing age, rubella immunity should be determined. If there is no evidence of immunity, females who are not pregnant should be vaccinated. If there is no evidence of immunity, females who are pregnant should delay immunization until after pregnancy.  Pneumococcal 13-valent conjugate (PCV13) vaccine.** / Consult your health care provider.  Pneumococcal polysaccharide (PPSV23) vaccine.** / 1 to 2 doses if you smoke cigarettes or if you have certain conditions.  Meningococcal vaccine.** /  Consult your health care provider.  Hepatitis A vaccine.** / Consult your health care provider.  Hepatitis B vaccine.** / Consult your health care provider.  Haemophilus influenzae type b (Hib) vaccine.** / Consult your health care provider. Ages 64 years and over  Blood pressure check.** / Every year.  Lipid and cholesterol check.** / Every 5 years beginning at age 23 years.  Lung cancer screening. / Every year if you  are aged 16-80 years and have a 30-pack-year history of smoking and currently smoke or have quit within the past 15 years. Yearly screening is stopped once you have quit smoking for at least 15 years or develop a health problem that would prevent you from having lung cancer treatment.  Clinical breast exam.** / Every year after age 74 years.  BRCA-related cancer risk assessment.** / For women who have family members with a BRCA-related cancer (breast, ovarian, tubal, or peritoneal cancers).  Mammogram.** / Every year beginning at age 44 years and continuing for as long as you are in good health. Consult with your health care provider.  Pap test.** / Every 3 years starting at age 58 years through age 22 or 39 years with 3 consecutive normal Pap tests. Testing can be stopped between 65 and 70 years with 3 consecutive normal Pap tests and no abnormal Pap or HPV tests in the past 10 years.  HPV screening.** / Every 3 years from ages 64 years through ages 70 or 61 years with a history of 3 consecutive normal Pap tests. Testing can be stopped between 65 and 70 years with 3 consecutive normal Pap tests and no abnormal Pap or HPV tests in the past 10 years.  Fecal occult blood test (FOBT) of stool. / Every year beginning at age 40 years and continuing until age 27 years. You may not need to do this test if you get a colonoscopy every 10 years.  Flexible sigmoidoscopy or colonoscopy.** / Every 5 years for a flexible sigmoidoscopy or every 10 years for a colonoscopy beginning at age 7 years and continuing until age 32 years.  Hepatitis C blood test.** / For all people born from [birth year] through 1965 and any individual with known risks for hepatitis C.  Osteoporosis screening.** / A one-time screening for women ages 30 years and over and women at risk for fractures or osteoporosis.  Skin self-exam. / Monthly.  Influenza vaccine. / Every year.  Tetanus, diphtheria, and acellular pertussis (Tdap/Td)  vaccine.** / 1 dose of Td every 10 years.  Varicella vaccine.** / Consult your health care provider.  Zoster vaccine.** / 1 dose for adults aged 35 years or older.  Pneumococcal 13-valent conjugate (PCV13) vaccine.** / Consult your health care provider.  Pneumococcal polysaccharide (PPSV23) vaccine.** / 1 dose for all adults aged 46 years and older.  Meningococcal vaccine.** / Consult your health care provider.  Hepatitis A vaccine.** / Consult your health care provider.  Hepatitis B vaccine.** / Consult your health care provider.  Haemophilus influenzae type b (Hib) vaccine.** / Consult your health care provider. ** Family history and personal history of risk and conditions may change your health care provider's recommendations.   This information is not intended to replace advice given to you by your health care provider. Make sure you discuss any questions you have with your health care provider.   Document Released: 07/26/2001 Document Revised: 06/20/2014 Document Reviewed: 10/25/2010 Elsevier Interactive Patient Education [employer].

## 2016-01-22 ENCOUNTER — Other Ambulatory Visit: Payer: Self-pay | Admitting: Family Medicine

## 2016-01-22 LAB — CBC WITH DIFFERENTIAL/PLATELET
BASOS ABS: 0.1 10*3/uL (ref 0.0–0.1)
Basophils Relative: 0.4 % (ref 0.0–3.0)
EOS ABS: 0.5 10*3/uL (ref 0.0–0.7)
Eosinophils Relative: 4.4 % (ref 0.0–5.0)
HCT: 45.7 % (ref 36.0–46.0)
HEMOGLOBIN: 15.4 g/dL — AB (ref 12.0–15.0)
Lymphocytes Relative: 22.5 % (ref 12.0–46.0)
Lymphs Abs: 2.7 10*3/uL (ref 0.7–4.0)
MCHC: 33.6 g/dL (ref 30.0–36.0)
MCV: 93.9 fl (ref 78.0–100.0)
MONO ABS: 0.4 10*3/uL (ref 0.1–1.0)
Monocytes Relative: 3.2 % (ref 3.0–12.0)
Neutro Abs: 8.3 10*3/uL — ABNORMAL HIGH (ref 1.4–7.7)
Neutrophils Relative %: 69.5 % (ref 43.0–77.0)
Platelets: 233 10*3/uL (ref 150.0–400.0)
RBC: 4.87 Mil/uL (ref 3.87–5.11)
RDW: 13.5 % (ref 11.5–15.5)
WBC: 12 10*3/uL — AB (ref 4.0–10.5)

## 2016-01-22 LAB — TSH: TSH: 1.46 u[IU]/mL (ref 0.35–4.50)

## 2016-01-22 LAB — LIPID PANEL
CHOLESTEROL: 187 mg/dL (ref 0–200)
HDL: 40.1 mg/dL (ref >39.00)
LDL CALC: 125 mg/dL — AB (ref 0–99)
NONHDL: 147.39
TRIGLYCERIDES: 114 mg/dL (ref 0.0–149.0)
Total CHOL/HDL Ratio: 5
VLDL: 22.8 mg/dL (ref 0.0–40.0)

## 2016-01-23 LAB — URINE CULTURE

## 2016-01-26 ENCOUNTER — Telehealth: Payer: Self-pay | Admitting: Family Medicine

## 2016-01-26 ENCOUNTER — Other Ambulatory Visit: Payer: Self-pay

## 2016-01-26 LAB — CYTOLOGY - PAP

## 2016-01-26 MED ORDER — FLUCONAZOLE 150 MG PO TABS: 150.0000 mg | ORAL_TABLET | Freq: Once | ORAL | 0 refills | Status: AC

## 2016-01-26 MED ORDER — CIPROFLOXACIN HCL 250 MG PO TABS: 250.0000 mg | ORAL_TABLET | Freq: Two times a day (BID) | ORAL | 0 refills | Status: DC

## 2016-01-26 NOTE — Telephone Encounter (Signed)
Rx faxed and VM left making the patient aware.       KP 

## 2016-01-26 NOTE — Telephone Encounter (Signed)
°  Relationship to patient: Self Can be reached: [phone]  [facility]  [phone] - [place], [place] - [address] [facility] [address] [facility] [address] [phone] (Phone) [phone] (Fax)     Reason for call: States that she was suppose to have an antibiotic called in but it has not been. Plse adv

## 2016-01-27 LAB — CERVICOVAGINAL ANCILLARY ONLY
BACTERIAL VAGINITIS: POSITIVE — AB
Candida vaginitis: NEGATIVE

## 2016-01-28 ENCOUNTER — Other Ambulatory Visit: Payer: Self-pay | Admitting: Family Medicine

## 2016-01-28 MED ORDER — METRONIDAZOLE 500 MG PO TABS: 500.0000 mg | ORAL_TABLET | Freq: Two times a day (BID) | ORAL | 0 refills | Status: DC

## 2016-06-22 ENCOUNTER — Other Ambulatory Visit: Payer: Self-pay | Admitting: Family Medicine

## 2016-06-22 DIAGNOSIS — Z9889 Other specified postprocedural states: Secondary | ICD-10-CM

## 2016-06-22 DIAGNOSIS — Z1231 Encounter for screening mammogram for malignant neoplasm of breast: Secondary | ICD-10-CM

## 2016-07-06 ENCOUNTER — Ambulatory Visit
Admission: RE | Admit: 2016-07-06 | Discharge: 2016-07-06 | Disposition: A | Discharge status: Home / Self Care | Payer: BLUE CROSS/BLUE SHIELD | Source: Ambulatory Visit | Attending: Family Medicine | Admitting: Family Medicine

## 2016-07-06 DIAGNOSIS — Z1231 Encounter for screening mammogram for malignant neoplasm of breast: Secondary | ICD-10-CM

## 2016-07-06 DIAGNOSIS — Z9889 Other specified postprocedural states: Secondary | ICD-10-CM

## 2016-07-25 ENCOUNTER — Ambulatory Visit: Payer: Self-pay | Admitting: Family Medicine

## 2016-09-23 ENCOUNTER — Other Ambulatory Visit: Payer: Self-pay | Admitting: Family Medicine

## 2016-09-23 MED ORDER — ALBUTEROL SULFATE HFA 108 (90 BASE) MCG/ACT IN AERS
2.0000 | INHALATION_SPRAY | Freq: Four times a day (QID) | RESPIRATORY_TRACT | 5 refills | Status: DC | PRN
Start: 2016-09-23 — End: 2017-02-03

## 2016-10-17 ENCOUNTER — Encounter: Payer: Self-pay | Admitting: Family Medicine

## 2016-10-17 ENCOUNTER — Ambulatory Visit (INDEPENDENT_AMBULATORY_CARE_PROVIDER_SITE_OTHER): Payer: BLUE CROSS/BLUE SHIELD | Admitting: Family Medicine

## 2016-10-17 VITALS — BP 122/80 | HR 82 | Temp 98.2°F | Resp 16 | Ht 63.0 in | Wt 158.4 lb

## 2016-10-17 DIAGNOSIS — I10 Essential (primary) hypertension: Secondary | ICD-10-CM | POA: Diagnosis not present

## 2016-10-17 DIAGNOSIS — E785 Hyperlipidemia, unspecified: Secondary | ICD-10-CM | POA: Diagnosis not present

## 2016-10-17 DIAGNOSIS — F17210 Nicotine dependence, cigarettes, uncomplicated: Secondary | ICD-10-CM | POA: Diagnosis not present

## 2016-10-17 LAB — COMPREHENSIVE METABOLIC PANEL
ALT: 21 U/L (ref 0–35)
AST: 17 U/L (ref 0–37)
Albumin: 4.3 g/dL (ref 3.5–5.2)
Alkaline Phosphatase: 87 U/L (ref 39–117)
BUN: 8 mg/dL (ref 6–23)
CALCIUM: 10.3 mg/dL (ref 8.4–10.5)
CHLORIDE: 108 meq/L (ref 96–112)
CO2: 27 meq/L (ref 19–32)
CREATININE: 0.53 mg/dL (ref 0.40–1.20)
GFR: 159.45 mL/min (ref >60.00)
Glucose, Bld: 96 mg/dL (ref 70–99)
Potassium: 4.2 mEq/L (ref 3.5–5.1)
Sodium: 141 mEq/L (ref 135–145)
Total Bilirubin: 0.5 mg/dL (ref 0.2–1.2)
Total Protein: 7.2 g/dL (ref 6.0–8.3)

## 2016-10-17 LAB — LIPID PANEL
CHOL/HDL RATIO: 5
Cholesterol: 194 mg/dL (ref 0–200)
HDL: 38.9 mg/dL — ABNORMAL LOW (ref >39.00)
LDL CALC: 133 mg/dL — AB (ref 0–99)
NONHDL: 155.04
TRIGLYCERIDES: 111 mg/dL (ref 0.0–149.0)
VLDL: 22.2 mg/dL (ref 0.0–40.0)

## 2016-10-17 MED ORDER — VARENICLINE TARTRATE 0.5 MG X 11 & 1 MG X 42 PO MISC: ORAL | 0 refills | Status: DC

## 2016-10-17 NOTE — Patient Instructions (Signed)
Steps to Quit Smoking Smoking tobacco can be bad for your health. It can also affect almost every organ in your body. Smoking puts you and people around you at risk for many serious long-lasting (chronic) diseases. Quitting smoking is hard, but it is one of the best things that you can do for your health. It is never too late to quit. What are the benefits of quitting smoking? When you quit smoking, you lower your risk for getting serious diseases and conditions. They can include:  Lung cancer or lung disease.  Heart disease.  Stroke.  Heart attack.  Not being able to have children (infertility).  Weak bones (osteoporosis) and broken bones (fractures). If you have coughing, wheezing, and shortness of breath, those symptoms may get better when you quit. You may also get sick less often. If you are pregnant, quitting smoking can help to lower your chances of having a baby of low birth weight. What can I do to help me quit smoking? Talk with your doctor about what can help you quit smoking. Some things you can do (strategies) include:  Quitting smoking totally, instead of slowly cutting back how much you smoke over a period of time.  Going to in-person counseling. You are more likely to quit if you go to many counseling sessions.  Using resources and support systems, such as:  Online chats with a counselor.  Phone quitlines.  Printed self-help materials.  Support groups or group counseling.  Text messaging programs.  Mobile phone apps or applications.  Taking medicines. Some of these medicines may have nicotine in them. If you are pregnant or breastfeeding, do not take any medicines to quit smoking unless your doctor says it is okay. Talk with your doctor about counseling or other things that can help you. Talk with your doctor about using more than one strategy at the same time, such as taking medicines while you are also going to in-person counseling. This can help make quitting  easier. What things can I do to make it easier to quit? Quitting smoking might feel very hard at first, but there is a lot that you can do to make it easier. Take these steps:  Talk to your family and friends. Ask them to support and encourage you.  Call phone quitlines, reach out to support groups, or work with a counselor.  Ask people who smoke to not smoke around you.  Avoid places that make you want (trigger) to smoke, such as:  Bars.  Parties.  Smoke-break areas at work.  Spend time with people who do not smoke.  Lower the stress in your life. Stress can make you want to smoke. Try these things to help your stress:  Getting regular exercise.  Deep-breathing exercises.  Yoga.  Meditating.  Doing a body scan. To do this, close your eyes, focus on one area of your body at a time from head to toe, and notice which parts of your body are tense. Try to relax the muscles in those areas.  Download or buy apps on your mobile phone or tablet that can help you stick to your quit plan. There are many free apps, such as QuitGuide from the CDC (Centers for Disease Control and Prevention). You can find more support from smokefree.gov and other websites. This information is not intended to replace advice given to you by your health care provider. Make sure you discuss any questions you have with your health care provider. Document Released: 03/26/2009 Document Revised: 01/26/2016 Document   Reviewed: 10/14/2014 Elsevier Interactive Patient Education  2017 Elsevier Inc.  

## 2016-10-17 NOTE — Progress Notes (Signed)
Pre visit review using our clinic review tool, if applicable. No additional management support is needed unless otherwise documented below in the visit note. 

## 2016-10-17 NOTE — Assessment & Plan Note (Signed)
Encouraged heart healthy diet, increase exercise, avoid trans fats, consider a krill oil cap daily 

## 2016-10-17 NOTE — Assessment & Plan Note (Signed)
Well controlled, no changes to meds. Encouraged heart healthy diet such as the DASH diet and exercise as tolerated.  °

## 2016-10-17 NOTE — Progress Notes (Signed)
Patient ID: Kimberly Rollins, female   DOB: 25-Dec-1970, 46 y.o.   MRN: [id number]     Subjective:    Patient ID: Kimberly Rollins, female    DOB: [birth year], 46 y.o.   MRN: [id number]  Chief Complaint  Patient presents with  . Nicotine Dependence    wants to stop.    HPI Patient is in today and wants to stop smoking.  She tried to cut down on her own and got down to 6 a day but then now smokes back to 1 ppd  Pt also needs f/u bp and cholesterol  Past Medical History:  Diagnosis Date  . Asthma    Seasonal  . Hyperlipidemia   . Hypertension     Past Surgical History:  Procedure Laterality Date  . BREAST REDUCTION SURGERY    . CESAREAN SECTION  1998    Family History  Problem Relation Age of Onset  . Hypertension Father   . Diabetes Father     Social History   Social History  . Marital status: Single    Spouse name: N/A  . Number of children: N/A  . Years of education: N/A   Occupational History  . [employer]   Social History Main Topics  . Smoking status: Current Every Day Smoker    Packs/day: 1.00    Years: 20.00    Types: Cigarettes  . Smokeless tobacco: Current User    Types: Chew     Comment: program at work--pt will try-- trying to cut down  . Alcohol use 0.0 oz/week     Comment: Socially  . Drug use: No  . Sexual activity: Yes    Partners: Female   Other Topics Concern  . Not on file   Social History Narrative   Exercising-- no    Outpatient Medications Prior to Visit  Medication Sig Dispense Refill  . albuterol (PROAIR HFA) 108 (90 Base) MCG/ACT inhaler Inhale 2 puffs into the lungs every 6 (six) hours as needed for wheezing or shortness of breath. 8.5 g 5  . amLODipine (NORVASC) 5 MG tablet TAKE 1 TABLET(5 MG) BY MOUTH DAILY 90 tablet 1  . aspirin 81 MG chewable tablet Chew 81 mg by mouth daily.    . beclomethasone (QVAR) 40 MCG/ACT inhaler Inhale 2 puffs into the lungs daily. 1 Inhaler 12  . ibuprofen  (ADVIL,MOTRIN) 800 MG tablet Take 1 tablet (800 mg total) by mouth 3 (three) times daily. 12 tablet 0  . lisinopril (PRINIVIL,ZESTRIL) 20 MG tablet TAKE 1 TABLET(20 MG) BY MOUTH DAILY 90 tablet 1  . ciprofloxacin (CIPRO) 250 MG tablet Take 1 tablet (250 mg total) by mouth 2 (two) times daily. 10 tablet 0  . loratadine (CLARITIN) 10 MG tablet Take 1 tablet (10 mg total) by mouth daily. 30 tablet 11  . metroNIDAZOLE (FLAGYL) 500 MG tablet Take 1 tablet (500 mg total) by mouth 2 (two) times daily. 14 tablet 0  . nitroGLYCERIN (NITRODUR - DOSED IN MG/24 HR) 0.2 mg/hr patch Apply 1/4th patch to affected area, change daily 30 patch 1  . NONFORMULARY OR COMPOUNDED ITEM bp cuff  #1  As directed  Dx htn 1 each 0   No facility-administered medications prior to visit.     Allergies  Allergen Reactions  . Latex Itching and Rash    Review of Systems  Constitutional: Negative for fever and malaise/fatigue.  HENT: Negative for congestion.   Eyes: Negative for blurred vision.  Respiratory: Negative for cough  and shortness of breath.   Cardiovascular: Negative for chest pain, palpitations and leg swelling.  Gastrointestinal: Negative for vomiting.  Musculoskeletal: Negative for back pain.  Skin: Negative for rash.  Neurological: Negative for loss of consciousness and headaches.       Objective:    Physical Exam  Constitutional: She is oriented to person, place, and time. She appears well-developed and well-nourished. No distress.  HENT:  Head: Normocephalic and atraumatic.  Eyes: Conjunctivae are normal.  Neck: Normal range of motion. No thyromegaly present.  Cardiovascular: Normal rate and regular rhythm.   Pulmonary/Chest: Effort normal and breath sounds normal. She has no wheezes.  Abdominal: Soft. Bowel sounds are normal. There is no tenderness.  Musculoskeletal: Normal range of motion. She exhibits no edema or deformity.  Neurological: She is alert and oriented to person, place, and  time.  Skin: Skin is warm and dry. She is not diaphoretic.  Psychiatric: She has a normal mood and affect.    BP 122/80 (BP Location: Left Arm, Cuff Size: Normal)   Pulse 82   Temp 98.2 F (36.8 C) (Oral)   Resp 16   Ht 5\' 3"  (1.6 m)   Wt 158 lb 6.4 oz (71.8 kg)   LMP 10/10/2016   SpO2 97%   BMI 28.06 kg/m  Wt Readings from Last 3 Encounters:  10/17/16 158 lb 6.4 oz (71.8 kg)  01/21/16 154 lb 6.4 oz (70 kg)  03/09/15 158 lb (71.7 kg)     Lab Results  Component Value Date   WBC 12.0 (H) 01/21/2016   HGB 15.4 (H) 01/21/2016   HCT 45.7 01/21/2016   PLT 233.0 01/21/2016   GLUCOSE 98 02/02/2015   CHOL 187 01/21/2016   TRIG 114.0 01/21/2016   HDL 40.10 01/21/2016   LDLDIRECT 149.3 08/02/2013   LDLCALC 125 (H) 01/21/2016   ALT 11 02/02/2015   AST 12 02/02/2015   NA 141 02/02/2015   K 3.5 02/02/2015   CL 110 02/02/2015   CREATININE 0.57 02/02/2015   BUN 11 02/02/2015   CO2 22 02/02/2015   TSH 1.46 01/21/2016   MICROALBUR 2.8 (H) 08/04/2014    Lab Results  Component Value Date   TSH 1.46 01/21/2016   Lab Results  Component Value Date   WBC 12.0 (H) 01/21/2016   HGB 15.4 (H) 01/21/2016   HCT 45.7 01/21/2016   MCV 93.9 01/21/2016   PLT 233.0 01/21/2016   Lab Results  Component Value Date   NA 141 02/02/2015   K 3.5 02/02/2015   CO2 22 02/02/2015   GLUCOSE 98 02/02/2015   BUN 11 02/02/2015   CREATININE 0.57 02/02/2015   BILITOT 0.4 02/02/2015   ALKPHOS 91 02/02/2015   AST 12 02/02/2015   ALT 11 02/02/2015   PROT 7.1 02/02/2015   ALBUMIN 4.0 02/02/2015   CALCIUM 9.4 02/02/2015   GFR 147.72 02/02/2015   Lab Results  Component Value Date   CHOL 187 01/21/2016   Lab Results  Component Value Date   HDL 40.10 01/21/2016   Lab Results  Component Value Date   LDLCALC 125 (H) 01/21/2016   Lab Results  Component Value Date   TRIG 114.0 01/21/2016   Lab Results  Component Value Date   CHOLHDL 5 01/21/2016   No results found for: HGBA1C       Assessment & Plan:   Problem List Items Addressed This Visit      Unprioritized   HTN (hypertension)    Well controlled, no changes  to meds. Encouraged heart healthy diet such as the DASH diet and exercise as tolerated.        Hyperlipidemia    Encouraged heart healthy diet, increase exercise, avoid trans fats, consider a krill oil cap daily       Other Visit Diagnoses    Smoking greater than 30 pack years    -  Primary   Relevant Medications   varenicline (CHANTIX STARTING MONTH PAK) 0.5 MG X 11 & 1 MG X 42 tablet   Hyperlipidemia LDL goal <100       Relevant Orders   Lipid panel   Comprehensive metabolic panel      I have discontinued Ms. Omeara's NONFORMULARY OR COMPOUNDED ITEM, loratadine, nitroGLYCERIN, ciprofloxacin, and metroNIDAZOLE. I am also having her start on varenicline. Additionally, I am having her maintain her aspirin, ibuprofen, beclomethasone, amLODipine, lisinopril, albuterol, and multivitamin.  Meds ordered this encounter  Medications  . Multiple Vitamin (MULTIVITAMIN) tablet    Sig: Take 1 tablet by mouth daily.  . varenicline (CHANTIX STARTING MONTH PAK) 0.5 MG X 11 & 1 MG X 42 tablet    Sig: Take one 0.5 mg tablet by mouth once daily for 3 days, then increase to one 0.5 mg tablet twice daily for 4 days, then increase to one 1 mg tablet twice daily.    Dispense:  53 tablet    Refill:  0     Donato Schultz, DO Subjective:     Hanifah Royse is a 46 y.o. female who began using tobacco   Assessment:    Tobacco Use/Cessation. I assessed Caiden to be in an action stage with respect to tobacco cessation.    Plan:  Pt will try chantix and check with work about help she can get through them as well as far as counseling

## 2016-10-20 ENCOUNTER — Other Ambulatory Visit: Payer: Self-pay | Admitting: Family Medicine

## 2016-10-20 MED ORDER — ATORVASTATIN CALCIUM 10 MG PO TABS: 10.0000 mg | ORAL_TABLET | Freq: Every day | ORAL | 3 refills | Status: DC

## 2016-11-29 ENCOUNTER — Other Ambulatory Visit: Payer: Self-pay | Admitting: Family Medicine

## 2016-11-29 DIAGNOSIS — I10 Essential (primary) hypertension: Secondary | ICD-10-CM

## 2017-01-26 ENCOUNTER — Other Ambulatory Visit (HOSPITAL_COMMUNITY)
Admission: RE | Admit: 2017-01-26 | Discharge: 2017-01-26 | Disposition: A | Discharge status: Home / Self Care | Payer: BLUE CROSS/BLUE SHIELD | Source: Ambulatory Visit | Attending: Family Medicine | Admitting: Family Medicine

## 2017-01-26 ENCOUNTER — Encounter: Payer: Self-pay | Admitting: Family Medicine

## 2017-01-26 ENCOUNTER — Ambulatory Visit (INDEPENDENT_AMBULATORY_CARE_PROVIDER_SITE_OTHER): Payer: BLUE CROSS/BLUE SHIELD | Admitting: Family Medicine

## 2017-01-26 VITALS — BP 108/80 | HR 62 | Temp 98.2°F | Ht 62.5 in | Wt 157.5 lb

## 2017-01-26 DIAGNOSIS — Z0000 Encounter for general adult medical examination without abnormal findings: Secondary | ICD-10-CM | POA: Insufficient documentation

## 2017-01-26 DIAGNOSIS — I10 Essential (primary) hypertension: Secondary | ICD-10-CM | POA: Diagnosis not present

## 2017-01-26 DIAGNOSIS — M545 Low back pain: Secondary | ICD-10-CM

## 2017-01-26 DIAGNOSIS — E785 Hyperlipidemia, unspecified: Secondary | ICD-10-CM

## 2017-01-26 DIAGNOSIS — B9689 Other specified bacterial agents as the cause of diseases classified elsewhere: Secondary | ICD-10-CM | POA: Insufficient documentation

## 2017-01-26 LAB — CBC WITH DIFFERENTIAL/PLATELET
Basophils Absolute: 0 10*3/uL (ref 0.0–0.1)
Basophils Relative: 0.4 % (ref 0.0–3.0)
EOS PCT: 3.5 % (ref 0.0–5.0)
Eosinophils Absolute: 0.4 10*3/uL (ref 0.0–0.7)
HCT: 48.1 % — ABNORMAL HIGH (ref 36.0–46.0)
Hemoglobin: 16.4 g/dL — ABNORMAL HIGH (ref 12.0–15.0)
LYMPHS ABS: 2.4 10*3/uL (ref 0.7–4.0)
Lymphocytes Relative: 22.3 % (ref 12.0–46.0)
MCHC: 34 g/dL (ref 30.0–36.0)
MCV: 94.3 fl (ref 78.0–100.0)
MONOS PCT: 4.9 % (ref 3.0–12.0)
Monocytes Absolute: 0.5 10*3/uL (ref 0.1–1.0)
NEUTROS ABS: 7.5 10*3/uL (ref 1.4–7.7)
Neutrophils Relative %: 68.9 % (ref 43.0–77.0)
Platelets: 220 10*3/uL (ref 150.0–400.0)
RBC: 5.11 Mil/uL (ref 3.87–5.11)
RDW: 13.4 % (ref 11.5–15.5)
WBC: 10.9 10*3/uL — ABNORMAL HIGH (ref 4.0–10.5)

## 2017-01-26 LAB — POC URINALSYSI DIPSTICK (AUTOMATED)
BILIRUBIN UA: NEGATIVE
Bilirubin, UA: NEGATIVE
Blood, UA: POSITIVE
GLUCOSE UA: NEGATIVE
Glucose, UA: NEGATIVE
Ketones, UA: NEGATIVE
Ketones, UA: NEGATIVE
Leukocytes, UA: NEGATIVE
Leukocytes, UA: NEGATIVE
NITRITE UA: NEGATIVE
Nitrite, UA: NEGATIVE
Protein, UA: NEGATIVE
Protein, UA: NEGATIVE
RBC UA: NEGATIVE
Spec Grav, UA: 1.015
Spec Grav, UA: 1.02 (ref 1.010–1.025)
Urobilinogen, UA: 0.2 U/dL
Urobilinogen, UA: 0.2 E.U./dL
pH, UA: 6
pH, UA: 6 (ref 5.0–8.0)

## 2017-01-26 LAB — COMPREHENSIVE METABOLIC PANEL WITH GFR
ALT: 24 U/L (ref 0–35)
AST: 20 U/L (ref 0–37)
Albumin: 3.9 g/dL (ref 3.5–5.2)
Alkaline Phosphatase: 86 U/L (ref 39–117)
BUN: 8 mg/dL (ref 6–23)
CO2: 26 meq/L (ref 19–32)
Calcium: 9.8 mg/dL (ref 8.4–10.5)
Chloride: 107 meq/L (ref 96–112)
Creatinine, Ser: 0.52 mg/dL (ref 0.40–1.20)
GFR: 162.79 mL/min
Glucose, Bld: 99 mg/dL (ref 70–99)
Potassium: 3.4 meq/L — ABNORMAL LOW (ref 3.5–5.1)
Sodium: 140 meq/L (ref 135–145)
Total Bilirubin: 0.5 mg/dL (ref 0.2–1.2)
Total Protein: 6.6 g/dL (ref 6.0–8.3)

## 2017-01-26 LAB — LIPID PANEL
Cholesterol: 199 mg/dL (ref 0–200)
HDL: 33.8 mg/dL — ABNORMAL LOW
LDL Cholesterol: 144 mg/dL — ABNORMAL HIGH (ref 0–99)
NonHDL: 164.87
Total CHOL/HDL Ratio: 6
Triglycerides: 102 mg/dL (ref 0.0–149.0)
VLDL: 20.4 mg/dL (ref 0.0–40.0)

## 2017-01-26 LAB — TSH: TSH: 1.29 u[IU]/mL (ref 0.35–4.50)

## 2017-01-26 NOTE — Patient Instructions (Signed)
Preventive Care 40-64 Years, Female Preventive care refers to lifestyle choices and visits with your health care provider that can promote health and wellness. What does preventive care include?  A yearly physical exam. This is also called an annual well check.  Dental exams once or twice a year.  Routine eye exams. Ask your health care provider how often you should have your eyes checked.  Personal lifestyle choices, including: ? Daily care of your teeth and gums. ? Regular physical activity. ? Eating a healthy diet. ? Avoiding tobacco and drug use. ? Limiting alcohol use. ? Practicing safe sex. ? Taking low-dose aspirin daily starting at age 58. ? Taking vitamin and mineral supplements as recommended by your health care provider. What happens during an annual well check? The services and screenings done by your health care provider during your annual well check will depend on your age, overall health, lifestyle risk factors, and family history of disease. Counseling Your health care provider may ask you questions about your:  Alcohol use.  Tobacco use.  Drug use.  Emotional well-being.  Home and relationship well-being.  Sexual activity.  Eating habits.  Work and work Statistician.  Method of birth control.  Menstrual cycle.  Pregnancy history.  Screening You may have the following tests or measurements:  Height, weight, and BMI.  Blood pressure.  Lipid and cholesterol levels. These may be checked every 5 years, or more frequently if you are over 81 years old.  Skin check.  Lung cancer screening. You may have this screening every year starting at age 78 if you have a 30-pack-year history of smoking and currently smoke or have quit within the past 15 years.  Fecal occult blood test (FOBT) of the stool. You may have this test every year starting at age 65.  Flexible sigmoidoscopy or colonoscopy. You may have a sigmoidoscopy every 5 years or a colonoscopy  every 10 years starting at age 30.  Hepatitis C blood test.  Hepatitis B blood test.  Sexually transmitted disease (STD) testing.  Diabetes screening. This is done by checking your blood sugar (glucose) after you have not eaten for a while (fasting). You may have this done every 1-3 years.  Mammogram. This may be done every 1-2 years. Talk to your health care provider about when you should start having regular mammograms. This may depend on whether you have a family history of breast cancer.  BRCA-related cancer screening. This may be done if you have a family history of breast, ovarian, tubal, or peritoneal cancers.  Pelvic exam and Pap test. This may be done every 3 years starting at age 80. Starting at age 36, this may be done every 5 years if you have a Pap test in combination with an HPV test.  Bone density scan. This is done to screen for osteoporosis. You may have this scan if you are at high risk for osteoporosis.  Discuss your test results, treatment options, and if necessary, the need for more tests with your health care provider. Vaccines Your health care provider may recommend certain vaccines, such as:  Influenza vaccine. This is recommended every year.  Tetanus, diphtheria, and acellular pertussis (Tdap, Td) vaccine. You may need a Td booster every 10 years.  Varicella vaccine. You may need this if you have not been vaccinated.  Zoster vaccine. You may need this after age 5.  Measles, mumps, and rubella (MMR) vaccine. You may need at least one dose of MMR if you were born in  1958 or later. You may also need a second dose.  Pneumococcal 13-valent conjugate (PCV13) vaccine. You may need this if you have certain conditions and were not previously vaccinated.  Pneumococcal polysaccharide (PPSV23) vaccine. You may need one or two doses if you smoke cigarettes or if you have certain conditions.  Meningococcal vaccine. You may need this if you have certain  conditions.  Hepatitis A vaccine. You may need this if you have certain conditions or if you travel or work in places where you may be exposed to hepatitis A.  Hepatitis B vaccine. You may need this if you have certain conditions or if you travel or work in places where you may be exposed to hepatitis B.  Haemophilus influenzae type b (Hib) vaccine. You may need this if you have certain conditions.  Talk to your health care provider about which screenings and vaccines you need and how often you need them. This information is not intended to replace advice given to you by your health care provider. Make sure you discuss any questions you have with your health care provider. Document Released: 06/26/2015 Document Revised: 02/17/2016 Document Reviewed: 03/31/2015 Elsevier Interactive Patient Education  2017 [employer].

## 2017-01-26 NOTE — Progress Notes (Signed)
Pre visit review using our clinic review tool, if applicable. No additional management support is needed unless otherwise documented below in the visit note. 

## 2017-01-26 NOTE — Progress Notes (Signed)
Subjective:     Kimberly Rollins is a 46 y.o. female and is here for a comprehensive physical exam. The patient reports problems - LBP x few days, + vaginal irritation and d/c + odor .  Social History   Social History  . Marital status: Single    Spouse name: N/A  . Number of children: N/A  . Years of education: N/A   Occupational History  . [employer]   Social History Main Topics  . Smoking status: Current Every Day Smoker    Packs/day: 1.00    Years: 20.00    Types: Cigarettes  . Smokeless tobacco: Current User    Types: Chew     Comment: program at work--pt will try-- trying to cut down  . Alcohol use 0.0 oz/week     Comment: Socially  . Drug use: No  . Sexual activity: Yes    Partners: Female   Other Topics Concern  . Not on file   Social History Narrative   Exercising-- no   Health Maintenance  Topic Date Due  . HIV Screening  05/30/1985  . TETANUS/TDAP  08/05/2016  . INFLUENZA VACCINE  01/11/2017  . MAMMOGRAM  07/06/2017  . PAP SMEAR  01/21/2019    The following portions of the patient's history were reviewed and updated as appropriate:  She  has a past medical history of Asthma; Hyperlipidemia; and Hypertension. She  does not have any pertinent problems on file. She  has a past surgical history that includes Cesarean section (1998) and Breast reduction surgery. Her family history includes Diabetes in her father; Hypertension in her father. She  reports that she has been smoking Cigarettes.  She has a 20.00 pack-year smoking history. Her smokeless tobacco use includes Chew. She reports that she drinks alcohol. She reports that she does not use drugs. She has a current medication list which includes the following prescription(s): albuterol, amlodipine, aspirin, beclomethasone, lisinopril, multivitamin, atorvastatin, and varenicline. Current Outpatient Prescriptions on File Prior to Visit  Medication Sig Dispense Refill  . albuterol  (PROAIR HFA) 108 (90 Base) MCG/ACT inhaler Inhale 2 puffs into the lungs every 6 (six) hours as needed for wheezing or shortness of breath. 8.5 g 5  . amLODipine (NORVASC) 5 MG tablet TAKE 1 TABLET(5 MG) BY MOUTH DAILY 90 tablet 0  . aspirin 81 MG chewable tablet Chew 81 mg by mouth daily.    . beclomethasone (QVAR) 40 MCG/ACT inhaler Inhale 2 puffs into the lungs daily. 1 Inhaler 12  . lisinopril (PRINIVIL,ZESTRIL) 20 MG tablet TAKE 1 TABLET(20 MG) BY MOUTH DAILY 90 tablet 0  . Multiple Vitamin (MULTIVITAMIN) tablet Take 1 tablet by mouth daily.    Marland Kitchen. atorvastatin (LIPITOR) 10 MG tablet Take 1 tablet (10 mg total) by mouth daily. (Patient not taking: Reported on 01/26/2017) 30 tablet 3  . varenicline (CHANTIX STARTING MONTH PAK) 0.5 MG X 11 & 1 MG X 42 tablet Take one 0.5 mg tablet by mouth once daily for 3 days, then increase to one 0.5 mg tablet twice daily for 4 days, then increase to one 1 mg tablet twice daily. (Patient not taking: Reported on 01/26/2017) 53 tablet 0   No current facility-administered medications on file prior to visit.    She is allergic to latex..  Review of Systems Review of Systems  Constitutional: Negative for activity change, appetite change and fatigue.  HENT: Negative for hearing loss, congestion, tinnitus and ear discharge.  dentist [id number] Eyes: Negative for  visual disturbance (see optho q1y -- vision corrected to 20/20 with glasses).  Respiratory: Negative for cough, chest tightness and shortness of breath.   Cardiovascular: Negative for chest pain, palpitations and leg swelling.  Gastrointestinal: Negative for abdominal pain, diarrhea, constipation and abdominal distention.  Genitourinary: Negative for urgency, frequency, decreased urine volume and difficulty urinating.  Musculoskeletal: Negative for back pain, arthralgias and gait problem.  Skin: Negative for color change, pallor and rash.  Neurological: Negative for dizziness, light-headedness, numbness and  headaches.  Hematological: Negative for adenopathy. Does not bruise/bleed easily.  Psychiatric/Behavioral: Negative for suicidal ideas, confusion, sleep disturbance, self-injury, dysphoric mood, decreased concentration and agitation.      Objective:    BP 108/80 (BP Location: Left Arm, Patient Position: Sitting, Cuff Size: Normal)   Pulse 62   Temp 98.2 F (36.8 C) (Oral)   Ht 5' 2.5" (1.588 m)   Wt 157 lb 8 oz (71.4 kg)   SpO2 98%   BMI 28.35 kg/m  General appearance: alert, cooperative, appears stated age and no distress Head: Normocephalic, without obvious abnormality, atraumatic Eyes: negative findings: lids and lashes normal, conjunctivae and sclerae normal and pupils equal, round, reactive to light and accomodation Ears: normal TM's and external ear canals both ears Nose: Nares normal. Septum midline. Mucosa normal. No drainage or sinus tenderness. Throat: lips, mucosa, and tongue normal; teeth and gums normal Neck: no adenopathy, no carotid bruit, no JVD, supple, symmetrical, trachea midline and thyroid not enlarged, symmetric, no tenderness/mass/nodules Back: symmetric, no curvature. ROM normal. No CVA tenderness. Lungs: clear to auscultation bilaterally Breasts: normal appearance, no masses or tenderness Heart: regular rate and rhythm, S1, S2 normal, no murmur, click, rub or gallop Abdomen: soft, non-tender; bowel sounds normal; no masses,  no organomegaly Pelvic: cervix normal in appearance, external genitalia normal, no adnexal masses or tenderness, no cervical motion tenderness, rectovaginal septum normal, uterus normal size, shape, and consistency, vagina normal without discharge and pap done    rectal- heme neg brown stool Extremities: extremities normal, atraumatic, no cyanosis or edema Pulses: 2+ and symmetric Skin: Skin color, texture, turgor normal. No rashes or lesions Lymph nodes: Cervical, supraclavicular, and axillary nodes normal. Neurologic: Alert and  oriented X 3, normal strength and tone. Normal symmetric reflexes. Normal coordination and gait    Assessment:    Healthy female exam.      Plan:    ghm utd Check labs See After Visit Summary for Counseling Recommendations    1. Low back pain, unspecified back pain laterality, unspecified chronicity, with sciatica presence unspecified Urine normal Will check bv and yeast rto If symptoms do not improve - POCT Urinalysis Dipstick (Automated) - Urine Culture  2. Hyperlipidemia, unspecified hyperlipidemia type Tolerating statin, encouraged heart healthy diet, avoid trans fats, minimize simple carbs and saturated fats. Increase exercise as tolerated - Lipid panel - Comprehensive metabolic panel - CBC with Differential/Platelet - TSH - POCT Urinalysis Dipstick (Automated)  3. Preventative health care See above - Lipid panel - Comprehensive metabolic panel - CBC with Differential/Platelet - TSH - POCT Urinalysis Dipstick (Automated) - Cytology - PAP  4. Essential hypertension Well controlled, no changes to meds. Encouraged heart healthy diet such as the DASH diet and exercise as tolerated.  - Lipid panel - Comprehensive metabolic panel - CBC with Differential/Platelet - TSH - POCT Urinalysis Dipstick (Automated)

## 2017-01-26 NOTE — Assessment & Plan Note (Signed)
Tolerating statin, encouraged heart healthy diet, avoid trans fats, minimize simple carbs and saturated fats. Increase exercise as tolerated 

## 2017-01-27 LAB — URINE CULTURE: Organism ID, Bacteria: NO GROWTH

## 2017-01-29 ENCOUNTER — Encounter: Payer: Self-pay | Admitting: Family Medicine

## 2017-01-30 MED ORDER — ATORVASTATIN CALCIUM 20 MG PO TABS: 20.0000 mg | ORAL_TABLET | Freq: Every day | ORAL | 2 refills | Status: DC

## 2017-01-30 NOTE — Addendum Note (Signed)
Addended by: Vergie Living on: 01/30/2017 09:28 AM   Modules accepted: Orders

## 2017-01-31 LAB — CYTOLOGY - PAP
Adequacy: ABSENT
BACTERIAL VAGINITIS: POSITIVE — AB
CANDIDA VAGINITIS: NEGATIVE
Chlamydia: NEGATIVE
DIAGNOSIS: NEGATIVE
NEISSERIA GONORRHEA: NEGATIVE
TRICH (WINDOWPATH): NEGATIVE

## 2017-02-01 ENCOUNTER — Telehealth: Payer: Self-pay | Admitting: Family Medicine

## 2017-02-01 MED ORDER — FLUCONAZOLE 150 MG PO TABS: 150.0000 mg | ORAL_TABLET | Freq: Once | ORAL | 0 refills | Status: AC

## 2017-02-01 NOTE — Telephone Encounter (Signed)
-----   Message from Vergie Living, LPN sent at 4/40/1027 10:12 AM EDT ----- Dr. Dallas Schimke I spoke with the patient about her results and she has BV. Dr. Laury Axon prescribed Flagy 500 mg BID x 7days but patient state when she take any abx she gets a yeast infection. Patient request Diflucan. Please advise. LB

## 2017-02-02 LAB — CERVICOVAGINAL ANCILLARY ONLY: Herpes: NEGATIVE

## 2017-02-02 MED ORDER — METRONIDAZOLE 500 MG PO TABS
500.0000 mg | ORAL_TABLET | Freq: Two times a day (BID) | ORAL | 0 refills | Status: DC
Start: 2017-02-02 — End: 2018-01-30

## 2017-02-02 NOTE — Addendum Note (Signed)
Addended by: Vergie Living on: 02/02/2017 11:12 AM   Modules accepted: Orders

## 2017-02-02 NOTE — Telephone Encounter (Signed)
Sent Flagy to pharmacy. Waiting on order from provider for Diflucan. LB

## 2017-02-02 NOTE — Telephone Encounter (Signed)
Pt called in because she said that she was advised that 2 medications were being sent in to the pharmacy.   1. Flagyl  2. Diflucan    Pt said that Diflucan was sent in but to the incorrect pharmacy  She would like to have it go to :    [id number] - [place], [place] - [address] [place] [address] [place] [address]

## 2017-02-03 ENCOUNTER — Other Ambulatory Visit: Payer: Self-pay

## 2017-02-03 DIAGNOSIS — B3731 Acute candidiasis of vulva and vagina: Secondary | ICD-10-CM

## 2017-02-03 DIAGNOSIS — B373 Candidiasis of vulva and vagina: Secondary | ICD-10-CM

## 2017-02-03 MED ORDER — ALBUTEROL SULFATE HFA 108 (90 BASE) MCG/ACT IN AERS: 2.0000 | INHALATION_SPRAY | Freq: Four times a day (QID) | RESPIRATORY_TRACT | 5 refills | Status: DC | PRN

## 2017-02-03 MED ORDER — FLUCONAZOLE 150 MG PO TABS: 150.0000 mg | ORAL_TABLET | Freq: Once | ORAL | 0 refills | Status: AC

## 2017-05-26 ENCOUNTER — Other Ambulatory Visit: Payer: Self-pay | Admitting: Family Medicine

## 2017-05-26 DIAGNOSIS — Z1231 Encounter for screening mammogram for malignant neoplasm of breast: Secondary | ICD-10-CM

## 2017-07-10 ENCOUNTER — Ambulatory Visit: Payer: Self-pay

## 2017-08-25 ENCOUNTER — Other Ambulatory Visit: Payer: Self-pay | Admitting: Family Medicine

## 2017-08-25 DIAGNOSIS — I10 Essential (primary) hypertension: Secondary | ICD-10-CM

## 2017-09-05 DIAGNOSIS — J309 Allergic rhinitis, unspecified: Secondary | ICD-10-CM | POA: Diagnosis not present

## 2017-11-10 ENCOUNTER — Other Ambulatory Visit: Payer: Self-pay | Admitting: Family Medicine

## 2017-11-10 DIAGNOSIS — E785 Hyperlipidemia, unspecified: Secondary | ICD-10-CM

## 2017-12-29 ENCOUNTER — Ambulatory Visit
Admission: RE | Admit: 2017-12-29 | Discharge: 2017-12-29 | Disposition: A | Discharge status: Home / Self Care | Payer: 59 | Source: Ambulatory Visit | Attending: Family Medicine | Admitting: Family Medicine

## 2017-12-29 DIAGNOSIS — Z1231 Encounter for screening mammogram for malignant neoplasm of breast: Secondary | ICD-10-CM

## 2018-01-21 ENCOUNTER — Other Ambulatory Visit: Payer: Self-pay | Admitting: Family Medicine

## 2018-01-22 ENCOUNTER — Encounter: Payer: Self-pay | Admitting: Family Medicine

## 2018-01-30 ENCOUNTER — Ambulatory Visit (INDEPENDENT_AMBULATORY_CARE_PROVIDER_SITE_OTHER): Payer: 59 | Admitting: Family Medicine

## 2018-01-30 ENCOUNTER — Encounter: Payer: Self-pay | Admitting: Family Medicine

## 2018-01-30 VITALS — BP 124/88 | HR 76 | Temp 98.2°F | Resp 16 | Ht 62.0 in | Wt 165.6 lb

## 2018-01-30 DIAGNOSIS — Z0000 Encounter for general adult medical examination without abnormal findings: Secondary | ICD-10-CM | POA: Diagnosis not present

## 2018-01-30 DIAGNOSIS — R8290 Unspecified abnormal findings in urine: Secondary | ICD-10-CM

## 2018-01-30 DIAGNOSIS — R82998 Other abnormal findings in urine: Secondary | ICD-10-CM | POA: Diagnosis not present

## 2018-01-30 DIAGNOSIS — Z23 Encounter for immunization: Secondary | ICD-10-CM | POA: Diagnosis not present

## 2018-01-30 DIAGNOSIS — M25572 Pain in left ankle and joints of left foot: Secondary | ICD-10-CM | POA: Diagnosis not present

## 2018-01-30 DIAGNOSIS — G8929 Other chronic pain: Secondary | ICD-10-CM

## 2018-01-30 DIAGNOSIS — R350 Frequency of micturition: Secondary | ICD-10-CM

## 2018-01-30 DIAGNOSIS — E785 Hyperlipidemia, unspecified: Secondary | ICD-10-CM | POA: Diagnosis not present

## 2018-01-30 DIAGNOSIS — M25571 Pain in right ankle and joints of right foot: Secondary | ICD-10-CM | POA: Diagnosis not present

## 2018-01-30 DIAGNOSIS — I10 Essential (primary) hypertension: Secondary | ICD-10-CM

## 2018-01-30 LAB — LIPID PANEL
CHOL/HDL RATIO: 5
Cholesterol: 175 mg/dL (ref 0–200)
HDL: 36.2 mg/dL — AB (ref >39.00)
LDL Cholesterol: 114 mg/dL — ABNORMAL HIGH (ref 0–99)
NONHDL: 138.95
TRIGLYCERIDES: 127 mg/dL (ref 0.0–149.0)
VLDL: 25.4 mg/dL (ref 0.0–40.0)

## 2018-01-30 LAB — COMPREHENSIVE METABOLIC PANEL
ALK PHOS: 107 U/L (ref 39–117)
ALT: 21 U/L (ref 0–35)
AST: 13 U/L (ref 0–37)
Albumin: 4.1 g/dL (ref 3.5–5.2)
BILIRUBIN TOTAL: 0.5 mg/dL (ref 0.2–1.2)
BUN: 9 mg/dL (ref 6–23)
CALCIUM: 9.7 mg/dL (ref 8.4–10.5)
CO2: 25 meq/L (ref 19–32)
Chloride: 108 mEq/L (ref 96–112)
Creatinine, Ser: 0.59 mg/dL (ref 0.40–1.20)
GFR: 140.1 mL/min (ref >60.00)
GLUCOSE: 111 mg/dL — AB (ref 70–99)
POTASSIUM: 3.7 meq/L (ref 3.5–5.1)
Sodium: 141 mEq/L (ref 135–145)
Total Protein: 6.8 g/dL (ref 6.0–8.3)

## 2018-01-30 LAB — CBC WITH DIFFERENTIAL/PLATELET
BASOS PCT: 1 % (ref 0.0–3.0)
Basophils Absolute: 0.1 10*3/uL (ref 0.0–0.1)
EOS PCT: 5.4 % — AB (ref 0.0–5.0)
Eosinophils Absolute: 0.6 10*3/uL (ref 0.0–0.7)
HEMATOCRIT: 46.1 % — AB (ref 36.0–46.0)
Hemoglobin: 15.9 g/dL — ABNORMAL HIGH (ref 12.0–15.0)
LYMPHS ABS: 2.8 10*3/uL (ref 0.7–4.0)
LYMPHS PCT: 25.6 % (ref 12.0–46.0)
MCHC: 34.4 g/dL (ref 30.0–36.0)
MCV: 91.9 fl (ref 78.0–100.0)
Monocytes Absolute: 0.5 10*3/uL (ref 0.1–1.0)
Monocytes Relative: 4.8 % (ref 3.0–12.0)
NEUTROS ABS: 6.8 10*3/uL (ref 1.4–7.7)
NEUTROS PCT: 63.2 % (ref 43.0–77.0)
PLATELETS: 255 10*3/uL (ref 150.0–400.0)
RBC: 5.02 Mil/uL (ref 3.87–5.11)
RDW: 13.5 % (ref 11.5–15.5)
WBC: 10.8 10*3/uL — ABNORMAL HIGH (ref 4.0–10.5)

## 2018-01-30 LAB — POC URINALSYSI DIPSTICK (AUTOMATED)
BILIRUBIN UA: NEGATIVE
GLUCOSE UA: NEGATIVE
Ketones, UA: NEGATIVE
NITRITE UA: NEGATIVE
Protein, UA: NEGATIVE
RBC UA: NEGATIVE
Spec Grav, UA: 1.015 (ref 1.010–1.025)
Urobilinogen, UA: 0.2 E.U./dL
pH, UA: 6.5 (ref 5.0–8.0)

## 2018-01-30 LAB — TSH: TSH: 1.18 u[IU]/mL (ref 0.35–4.50)

## 2018-01-30 MED ORDER — MELOXICAM 15 MG PO TABS: ORAL_TABLET | ORAL | 0 refills | Status: DC

## 2018-01-30 MED ORDER — VARENICLINE TARTRATE 0.5 MG X 11 & 1 MG X 42 PO MISC: ORAL | 0 refills | Status: DC

## 2018-01-30 NOTE — Patient Instructions (Signed)
Preventive Care 40-64 Years, Female Preventive care refers to lifestyle choices and visits with your health care provider that can promote health and wellness. What does preventive care include?  A yearly physical exam. This is also called an annual well check.  Dental exams once or twice a year.  Routine eye exams. Ask your health care provider how often you should have your eyes checked.  Personal lifestyle choices, including: ? Daily care of your teeth and gums. ? Regular physical activity. ? Eating a healthy diet. ? Avoiding tobacco and drug use. ? Limiting alcohol use. ? Practicing safe sex. ? Taking low-dose aspirin daily starting at age 58. ? Taking vitamin and mineral supplements as recommended by your health care provider. What happens during an annual well check? The services and screenings done by your health care provider during your annual well check will depend on your age, overall health, lifestyle risk factors, and family history of disease. Counseling Your health care provider may ask you questions about your:  Alcohol use.  Tobacco use.  Drug use.  Emotional well-being.  Home and relationship well-being.  Sexual activity.  Eating habits.  Work and work Statistician.  Method of birth control.  Menstrual cycle.  Pregnancy history.  Screening You may have the following tests or measurements:  Height, weight, and BMI.  Blood pressure.  Lipid and cholesterol levels. These may be checked every 5 years, or more frequently if you are over 81 years old.  Skin check.  Lung cancer screening. You may have this screening every year starting at age 78 if you have a 30-pack-year history of smoking and currently smoke or have quit within the past 15 years.  Fecal occult blood test (FOBT) of the stool. You may have this test every year starting at age 65.  Flexible sigmoidoscopy or colonoscopy. You may have a sigmoidoscopy every 5 years or a colonoscopy  every 10 years starting at age 30.  Hepatitis C blood test.  Hepatitis B blood test.  Sexually transmitted disease (STD) testing.  Diabetes screening. This is done by checking your blood sugar (glucose) after you have not eaten for a while (fasting). You may have this done every 1-3 years.  Mammogram. This may be done every 1-2 years. Talk to your health care provider about when you should start having regular mammograms. This may depend on whether you have a family history of breast cancer.  BRCA-related cancer screening. This may be done if you have a family history of breast, ovarian, tubal, or peritoneal cancers.  Pelvic exam and Pap test. This may be done every 3 years starting at age 80. Starting at age 36, this may be done every 5 years if you have a Pap test in combination with an HPV test.  Bone density scan. This is done to screen for osteoporosis. You may have this scan if you are at high risk for osteoporosis.  Discuss your test results, treatment options, and if necessary, the need for more tests with your health care provider. Vaccines Your health care provider may recommend certain vaccines, such as:  Influenza vaccine. This is recommended every year.  Tetanus, diphtheria, and acellular pertussis (Tdap, Td) vaccine. You may need a Td booster every 10 years.  Varicella vaccine. You may need this if you have not been vaccinated.  Zoster vaccine. You may need this after age 5.  Measles, mumps, and rubella (MMR) vaccine. You may need at least one dose of MMR if you were born in  1958 or later. You may also need a second dose.  Pneumococcal 13-valent conjugate (PCV13) vaccine. You may need this if you have certain conditions and were not previously vaccinated.  Pneumococcal polysaccharide (PPSV23) vaccine. You may need one or two doses if you smoke cigarettes or if you have certain conditions.  Meningococcal vaccine. You may need this if you have certain  conditions.  Hepatitis A vaccine. You may need this if you have certain conditions or if you travel or work in places where you may be exposed to hepatitis A.  Hepatitis B vaccine. You may need this if you have certain conditions or if you travel or work in places where you may be exposed to hepatitis B.  Haemophilus influenzae type b (Hib) vaccine. You may need this if you have certain conditions.  Talk to your health care provider about which screenings and vaccines you need and how often you need them. This information is not intended to replace advice given to you by your health care provider. Make sure you discuss any questions you have with your health care provider. Document Released: 06/26/2015 Document Revised: 02/17/2016 Document Reviewed: 03/31/2015 Elsevier Interactive Patient Education  2018 Elsevier Inc.  

## 2018-01-30 NOTE — Progress Notes (Signed)
Subjective:     Kimberly Rollins is a 47 y.o. female and is here for a comprehensive physical exam. The patient reports problems - pt would like to consider restarting chantix. Pt also c/o b/l ankle pain that started with her R ankle that is now both ankles x several months.  No known injury.  She thought it was her shoes but she changed those and it has not improved.  No swelling.   She also c/o sinus pressure on the R and she is using her claritin and flonase with relief.   Social History   Socioeconomic History  . Marital status: Single    Spouse name: Not on file  . Number of children: Not on file  . Years of education: Not on file  . Highest education level: Not on file  Occupational History  . Occupation: [employer] home    Employer: [employer]  Social Needs  . Financial resource strain: Not on file  . Food insecurity:    Worry: Not on file    Inability: Not on file  . Transportation needs:    Medical: Not on file    Non-medical: Not on file  Tobacco Use  . Smoking status: Current Every Day Smoker    Packs/day: 1.00    Years: 20.00    Pack years: 20.00    Types: Cigarettes  . Smokeless tobacco: Current User    Types: Chew  . Tobacco comment: program at work--pt will try-- trying to cut down  Substance and Sexual Activity  . Alcohol use: Yes    Alcohol/week: 0.0 standard drinks    Comment: Socially  . Drug use: No  . Sexual activity: Yes    Partners: Female  Lifestyle  . Physical activity:    Days per week: Not on file    Minutes per session: Not on file  . Stress: Not on file  Relationships  . Social connections:    Talks on phone: Not on file    Gets together: Not on file    Attends religious service: Not on file    Active member of club or organization: Not on file    Attends meetings of clubs or organizations: Not on file    Relationship status: Not on file  . Intimate partner violence:    Fear of current or ex partner: Not on file     Emotionally abused: Not on file    Physically abused: Not on file    Forced sexual activity: Not on file  Other Topics Concern  . Not on file  Social History Narrative   Exercising-- no   Health Maintenance  Topic Date Due  . INFLUENZA VACCINE  01/11/2018  . HIV Screening  01/31/2024 (Originally 05/30/1985)  . MAMMOGRAM  12/30/2018  . PAP SMEAR  01/27/2020  . TETANUS/TDAP  01/31/2028    The following portions of the patient's history were reviewed and updated as appropriate:  She  has a past medical history of Asthma, Hyperlipidemia, and Hypertension. She does not have any pertinent problems on file. She  has a past surgical history that includes Cesarean section (1998) and Breast reduction surgery. Her family history includes Diabetes in her father; Hypertension in her father. She  reports that she has been smoking cigarettes. She has a 20.00 pack-year smoking history. Her smokeless tobacco use includes chew. She reports that she drinks alcohol. She reports that she does not use drugs. She has a current medication list which includes the following prescription(s):  amlodipine, aspirin, atorvastatin, lisinopril, multivitamin, proair hfa, varenicline, meloxicam, and varenicline. Current Outpatient Medications on File Prior to Visit  Medication Sig Dispense Refill  . amLODipine (NORVASC) 5 MG tablet TAKE 1 TABLET(5 MG) BY MOUTH DAILY 90 tablet 1  . aspirin 81 MG chewable tablet Chew 81 mg by mouth daily.    Marland Kitchen. atorvastatin (LIPITOR) 20 MG tablet TAKE 1 TABLET(20 MG) BY MOUTH DAILY 90 tablet 1  . lisinopril (PRINIVIL,ZESTRIL) 20 MG tablet TAKE 1 TABLET(20 MG) BY MOUTH DAILY 90 tablet 1  . Multiple Vitamin (MULTIVITAMIN) tablet Take 1 tablet by mouth daily.    Marland Kitchen. PROAIR HFA 108 (90 Base) MCG/ACT inhaler INHALE 2 PUFFS INTO THE LUNGS EVERY 6 HOURS AS NEEDED FOR WHEEZING OR SHORTNESS OF BREATH 8.5 g 1  . varenicline (CHANTIX STARTING MONTH PAK) 0.5 MG X 11 & 1 MG X 42 tablet Take one 0.5 mg  tablet by mouth once daily for 3 days, then increase to one 0.5 mg tablet twice daily for 4 days, then increase to one 1 mg tablet twice daily. 53 tablet 0   No current facility-administered medications on file prior to visit.    She is allergic to latex..  Review of Systems Review of Systems  Constitutional: Negative for activity change, appetite change and fatigue.  HENT: Negative for hearing, tinnitus and ear discharge.  dentist [id number] + sinus congestion Eyes: Negative for visual disturbance (see optho q1y -- vision corrected to 20/20 with glasses).  Respiratory: Negative for cough, chest tightness and shortness of breath.   Cardiovascular: Negative for chest pain, palpitations and leg swelling.  Gastrointestinal: Negative for abdominal pain, diarrhea, constipation and abdominal distention.  Genitourinary: Negative for urgency, frequency, decreased urine volume and difficulty urinating.  Musculoskeletal: Negative for back pain,  and gait problem. + ankle pain b/l  Skin: Negative for color change, pallor and rash.  Neurological: Negative for dizziness, light-headedness, numbness and headaches.  Hematological: Negative for adenopathy. Does not bruise/bleed easily.  Psychiatric/Behavioral: Negative for suicidal ideas, confusion, sleep disturbance, self-injury, dysphoric mood, decreased concentration and agitation.       Objective:    BP 124/88   Pulse 76   Temp 98.2 F (36.8 C) (Oral)   Resp 16   Ht 5\' 2"  (1.575 m)   Wt 165 lb 9.6 oz (75.1 kg)   LMP 01/23/2018   SpO2 100%   BMI 30.29 kg/m  General appearance: alert, cooperative, appears stated age, flushed and no distress Head: Normocephalic, without obvious abnormality, atraumatic Eyes: negative findings: lids and lashes normal and pupils equal, round, reactive to light and accomodation Ears: normal TM's and external ear canals both ears Nose: Nares normal. Septum midline. Mucosa normal. No drainage or sinus  tenderness. Throat: lips, mucosa, and tongue normal; teeth and gums normal Neck: no adenopathy, no carotid bruit, no JVD, supple, symmetrical, trachea midline and thyroid not enlarged, symmetric, no tenderness/mass/nodules Back: symmetric, no curvature. ROM normal. No CVA tenderness. Lungs: clear to auscultation bilaterally Breasts: normal appearance, no masses or tenderness Heart: regular rate and rhythm, S1, S2 normal, no murmur, click, rub or gallop Abdomen: soft, non-tender; bowel sounds normal; no masses,  no organomegaly Pelvic: deferred Extremities: extremities normal, atraumatic, no cyanosis or edema Pulses: 2+ and symmetric Skin: Skin color, texture, turgor normal. No rashes or lesions Lymph nodes: Cervical, supraclavicular, and axillary nodes normal. Neurologic: Alert and oriented X 3, normal strength and tone. Normal symmetric reflexes. Normal coordination and gait    Assessment:    Healthy  female exam.     Plan:    ghm utd Check labs See After Visit Summary for Counseling Recommendations    1. Frequency of urination   - POCT Urinalysis Dipstick (Automated)  2. Preventative health care See above - CBC with Differential/Platelet - Comprehensive metabolic panel - Lipid panel - TSH - varenicline (CHANTIX STARTING MONTH PAK) 0.5 MG X 11 & 1 MG X 42 tablet; Take one 0.5 mg tablet by mouth once daily for 3 days, then increase to one 0.5 mg tablet twice daily for 4 days, then increase to one 1 mg tablet twice daily.  Dispense: 53 tablet; Refill: 0  3. Essential hypertension Well controlled, no changes to meds. Encouraged heart healthy diet such as the DASH diet and exercise as tolerated.  - Comprehensive metabolic panel - Lipid panel  4. Hyperlipidemia LDL goal <100 Tolerating statin, encouraged heart healthy diet, avoid trans fats, minimize simple carbs and saturated fats. Increase exercise as tolerated - Comprehensive metabolic panel - Lipid panel  5. Chronic  pain of both ankles No injury   ? Arthritis mobic prn   - Ambulatory referral to Sports Medicine - meloxicam (MOBIC) 15 MG tablet; 1 po qd prn  Dispense: 30 tablet; Refill: 0

## 2018-02-01 LAB — URINE CULTURE
MICRO NUMBER:: 90990788
SPECIMEN QUALITY:: ADEQUATE

## 2018-02-02 ENCOUNTER — Other Ambulatory Visit: Payer: Self-pay

## 2018-02-02 DIAGNOSIS — E785 Hyperlipidemia, unspecified: Secondary | ICD-10-CM

## 2018-02-02 DIAGNOSIS — I10 Essential (primary) hypertension: Secondary | ICD-10-CM

## 2018-02-02 MED ORDER — FLUCONAZOLE 150 MG PO TABS: ORAL_TABLET | ORAL | 0 refills | Status: DC

## 2018-02-02 MED ORDER — CIPROFLOXACIN HCL 250 MG PO TABS: 250.0000 mg | ORAL_TABLET | Freq: Two times a day (BID) | ORAL | 0 refills | Status: DC

## 2018-02-24 ENCOUNTER — Other Ambulatory Visit: Payer: Self-pay | Admitting: Family Medicine

## 2018-02-24 DIAGNOSIS — M25571 Pain in right ankle and joints of right foot: Principal | ICD-10-CM

## 2018-02-24 DIAGNOSIS — G8929 Other chronic pain: Secondary | ICD-10-CM

## 2018-02-24 DIAGNOSIS — M25572 Pain in left ankle and joints of left foot: Principal | ICD-10-CM

## 2018-02-28 NOTE — Progress Notes (Signed)
Kimberly Rollins D.O. [facility] [address] [place], [place] [address] Phone: [phone] Subjective:   Kimberly Rollins, Kimberly Rollins, am serving as a scribe for Dr. Antoine PrimasZachary Rollins.  Referred by Kimberly Rollins, Kimberly R, DO   CC: Ankle pain right greater than left  BJY:NWGNFAOZHYHPI:Subjective  Kimberly Rollins is a 47 y.o. female coming in with complaint of bilateral ankle pain. Pain over lateral mal. Pain started in right foot in 2012 when she fell on her knee, twisting her ankle. Her left ankle has now become painful, increasing over the past 2 months. Denies any radiating symptoms. Constant pain, but worse after sitting for prolonged periods of time or when she wakes up in morning. Tries to move ankles around in the mornings before standing on them. Does wear Danskos for work but flip flops at home.  Patient has not seen pain is seem to be worsening now.    Past Medical History:  Diagnosis Date  . Asthma    Seasonal  . Hyperlipidemia   . Hypertension    Past Surgical History:  Procedure Laterality Date  . BREAST REDUCTION SURGERY    . CESAREAN SECTION  1998   Social History   Socioeconomic History  . Marital status: Single    Spouse name: Not on file  . Number of children: Not on file  . Years of education: Not on file  . Highest education level: Not on file  Occupational History  . Occupation: [employer]    Employer: [employer]  Social Needs  . Financial resource strain: Not on file  . Food insecurity:    Worry: Not on file    Inability: Not on file  . Transportation needs:    Medical: Not on file    Non-medical: Not on file  Tobacco Use  . Smoking status: Current Every Day Smoker    Packs/day: 1.00    Years: 20.00    Pack years: 20.00    Types: Cigarettes  . Smokeless tobacco: Current User    Types: Chew  . Tobacco comment: program at work--pt will try-- trying to cut down  Substance and Sexual Activity  . Alcohol use: Yes    Alcohol/week: 0.0 standard drinks     Comment: Socially  . Drug use: No  . Sexual activity: Yes    Partners: Female  Lifestyle  . Physical activity:    Days per week: Not on file    Minutes per session: Not on file  . Stress: Not on file  Relationships  . Social connections:    Talks on phone: Not on file    Gets together: Not on file    Attends religious service: Not on file    Active member of club or organization: Not on file    Attends meetings of clubs or organizations: Not on file    Relationship status: Not on file  Other Topics Concern  . Not on file  Social History Narrative   Exercising-- no   Allergies  Allergen Reactions  . Latex Itching and Rash   Family History  Problem Relation Age of Onset  . Hypertension Father   . Diabetes Father   . Breast cancer Neg Hx      Current Outpatient Medications (Cardiovascular):  .  amLODipine (NORVASC) 5 MG tablet, TAKE 1 TABLET(5 MG) BY MOUTH DAILY .  atorvastatin (LIPITOR) 20 MG tablet, TAKE 1 TABLET(20 MG) BY MOUTH DAILY .  lisinopril (PRINIVIL,ZESTRIL) 20 MG tablet, TAKE 1 TABLET(20 MG)  BY MOUTH DAILY  Current Outpatient Medications (Respiratory):  .  PROAIR HFA 108 (90 Base) MCG/ACT inhaler, INHALE 2 PUFFS INTO THE LUNGS EVERY 6 HOURS AS NEEDED FOR WHEEZING OR SHORTNESS OF BREATH  Current Outpatient Medications (Analgesics):  .  aspirin 81 MG chewable tablet, Chew 81 mg by mouth daily. .  meloxicam (MOBIC) 15 MG tablet, TAKE 1 TABLET BY MOUTH EVERY DAY AS NEEDED   Current Outpatient Medications (Other):  .  ciprofloxacin (CIPRO) 250 MG tablet, Take 1 tablet (250 mg total) by mouth 2 (two) times daily. .  fluconazole (DIFLUCAN) 150 MG tablet, Take 1 tablet by mouth once. Repeat in one week if needed. .  Multiple Vitamin (MULTIVITAMIN) tablet, Take 1 tablet by mouth daily. .  varenicline (CHANTIX STARTING MONTH PAK) 0.5 MG X 11 & 1 MG X 42 tablet, Take one 0.5 mg tablet by mouth once daily for 3 days, then increase to one 0.5 mg tablet twice daily  for 4 days, then increase to one 1 mg tablet twice daily. .  varenicline (CHANTIX STARTING MONTH PAK) 0.5 MG X 11 & 1 MG X 42 tablet, Take one 0.5 mg tablet by mouth once daily for 3 days, then increase to one 0.5 mg tablet twice daily for 4 days, then increase to one 1 mg tablet twice daily. .  Diclofenac Sodium 2 % SOLN, Place 2 g onto the skin 2 (two) times daily.    Past medical history, social, surgical and family history all reviewed in electronic medical record.  No pertanent information unless stated regarding to the chief complaint.   Review of Systems:  No headache, visual changes, nausea, vomiting, diarrhea, constipation, dizziness, abdominal pain, skin rash, fevers, chills, night sweats, weight loss, swollen lymph nodes, body aches, joint swelling, , chest pain, shortness of breath, mood changes.  Positive muscle aches  Objective  Blood pressure 118/86, pulse 92, height 5\' 2"  (1.575 m), weight 165 lb (74.8 kg), SpO2 97 %.   General: No apparent distress alert and oriented x3 mood and affect normal, dressed appropriately.  HEENT: Pupils equal, extraocular movements intact  Respiratory: Patient's speak in full sentences and does not appear short of breath  Cardiovascular: No lower extremity edema, non tender, no erythema  Skin: Warm dry intact with no signs of infection or rash on extremities or on axial skeleton.  Abdomen: Soft nontender  Neuro: Cranial nerves II through XII are intact, neurovascularly intact in all extremities with 2+ DTRs and 2+ pulses.  Lymph: No lymphadenopathy of posterior or anterior cervical chain or axillae bilaterally.  Gait normal with good balance and coordination.  MSK:  Non tender with full range of motion and good stability and symmetric strength and tone of shoulders, elbows, wrist, hip, knee bilaterally.  Ankle: Bilateral No visible erythema or swelling. Range of motion is full in all directions. Strength is 5/5 in all directions. Stable lateral  and medial ligaments; squeeze test and kleiger test unremarkable; Patient does have tenderness to palpation and tightness in the posterior tibialis right greater than left.  Mild pain over the joint space on the lateral aspect of the ankle. Able to walk 4 steps.  MSK US performed of: Right ankle This study was ordered, performed, and interpreted by Terrilee Files D.O.  Foot/Ankle:   All structures visualized.   Talar dome unremarkable  Peroneus longus and brevis tendons unremarkable on long and transverse views without sheath effusions.  Lateral aspect of the ankle though does show a trace effusion.  Patient does have an abnormal vessel surrounding this area.  Not fully compressible. Posterior tibialis, severe hypoechoic changes.  This is posterior and inferior to the medial malleolus  IMPRESSION: Chronic posterior tibialis tendon inflammation, abnormal vessel in the lateral aspect of the ankle questionable deep venous thrombosis,   97110; 15 additional minutes spent for Therapeutic exercises as stated in above notes.  This included exercises focusing on stretching, strengthening, with significant focus on eccentric aspects.   Long term goals include an improvement in range of motion, strength, endurance as well as avoiding reinjury. Patient's frequency would include in 1-2 times a day, 3-5 times a week for a duration of 6-12 weeks. Ankle strengthening that included:  Basic range of motion exercises to allow proper full motion at ankle Stretching of the lower leg and hamstrings  Theraband exercises for the lower leg - inversion, eversion, dorsiflexion and plantarflexion each to be completed with a theraband Balance exercises to increase proprioception Weight bearing exercises to increase strength and balance   Proper technique shown and discussed handout in great detail with ATC.  All questions were discussed and answered.     Impression and Recommendations:     This case required medical  decision making of moderate complexity. The above documentation has been reviewed and is accurate and complete Kimberly Saa, DO       Note: This dictation was prepared with Dragon dictation along with smaller phrase technology. Any transcriptional errors that result from this process are unintentional.

## 2018-03-01 ENCOUNTER — Encounter: Payer: Self-pay | Admitting: Family Medicine

## 2018-03-01 ENCOUNTER — Ambulatory Visit: Payer: 59 | Admitting: Family Medicine

## 2018-03-01 VITALS — BP 118/86 | HR 92 | Ht 62.0 in | Wt 165.0 lb

## 2018-03-01 DIAGNOSIS — G8929 Other chronic pain: Secondary | ICD-10-CM

## 2018-03-01 DIAGNOSIS — M76821 Posterior tibial tendinitis, right leg: Secondary | ICD-10-CM | POA: Insufficient documentation

## 2018-03-01 DIAGNOSIS — M25572 Pain in left ankle and joints of left foot: Secondary | ICD-10-CM | POA: Diagnosis not present

## 2018-03-01 DIAGNOSIS — M25571 Pain in right ankle and joints of right foot: Secondary | ICD-10-CM

## 2018-03-01 MED ORDER — DICLOFENAC SODIUM 2 % TD SOLN: 2.0000 g | Freq: Two times a day (BID) | TRANSDERMAL | 3 refills | Status: DC

## 2018-03-01 NOTE — Assessment & Plan Note (Addendum)
Patient does have more of a tendinitis.  We discussed over-the-counter orthotics, home exercise, topical anti-inflammatories and icing regimen.  Patient will follow-up again in 4 weeks for further evaluation.  Worsening symptoms consider injection.  Due to the mild abnormality noted on the ultrasound and unable to fully compress we will get a Doppler to rule out for the possibility of venous thrombosis and in the interim patient will take 2 baby aspirins.  I do think this is low likelihood with him being some chronic but I do feel like it is worth ruling out.

## 2018-03-01 NOTE — Patient Instructions (Signed)
Good to see you  Ice is your friend Ice 20 minutes 2 times daily. Usually after activity and before bed. pennsaid pinkie amount topically 2 times daily as needed.  Exercises 3 times a week.  Good shoes with rigid bottom.  Dierdre HarnessKeen, Dansko, Merrell or New balance greater then 700 Spenco orthotics "total support" online would be great  Avoid being barefoot.  See me again in 4 weeks and I promise if not better then I will inject

## 2018-03-08 ENCOUNTER — Ambulatory Visit (INDEPENDENT_AMBULATORY_CARE_PROVIDER_SITE_OTHER): Payer: 59 | Admitting: Nurse Practitioner

## 2018-03-08 ENCOUNTER — Encounter (INDEPENDENT_AMBULATORY_CARE_PROVIDER_SITE_OTHER): Payer: 59

## 2018-03-08 ENCOUNTER — Ambulatory Visit (INDEPENDENT_AMBULATORY_CARE_PROVIDER_SITE_OTHER): Payer: 59

## 2018-03-08 DIAGNOSIS — M25571 Pain in right ankle and joints of right foot: Secondary | ICD-10-CM

## 2018-03-08 DIAGNOSIS — G8929 Other chronic pain: Secondary | ICD-10-CM

## 2018-03-08 DIAGNOSIS — M25572 Pain in left ankle and joints of left foot: Secondary | ICD-10-CM

## 2018-03-16 ENCOUNTER — Other Ambulatory Visit: Payer: Self-pay | Admitting: Family Medicine

## 2018-03-28 NOTE — Progress Notes (Signed)
[facility] [address] [place], [place] [address] Phone: [phone] Subjective:   Bruce Donath, am serving as a scribe for Dr. Antoine Primas.  CC: Bilateral ankle pain  BJY:NWGNFAOZHY  Kimberly Rollins is a 47 y.o. female coming in with complaint of ankle pain. Has less pain in morning but does have achiness throughout the day. Pain continues of lateral aspect of each foot. Did perform the exercises. Trying to wear supportive shoes as much as possible.  Patient was found to have posterior tibialis tendinitis previously.  Feels like making progress.  Would state about 50 to 60% better.     Past Medical History:  Diagnosis Date  . Asthma    Seasonal  . Hyperlipidemia   . Hypertension    Past Surgical History:  Procedure Laterality Date  . BREAST REDUCTION SURGERY    . CESAREAN SECTION  1998   Social History   Socioeconomic History  . Marital status: Single    Spouse name: Not on file  . Number of children: Not on file  . Years of education: Not on file  . Highest education level: Not on file  Occupational History  . Occupation: [employer] home    Employer: [employer]  Social Needs  . Financial resource strain: Not on file  . Food insecurity:    Worry: Not on file    Inability: Not on file  . Transportation needs:    Medical: Not on file    Non-medical: Not on file  Tobacco Use  . Smoking status: Current Every Day Smoker    Packs/day: 1.00    Years: 20.00    Pack years: 20.00    Types: Cigarettes  . Smokeless tobacco: Current User    Types: Chew  . Tobacco comment: program at work--pt will try-- trying to cut down  Substance and Sexual Activity  . Alcohol use: Yes    Alcohol/week: 0.0 standard drinks    Comment: Socially  . Drug use: No  . Sexual activity: Yes    Partners: Female  Lifestyle  . Physical activity:    Days per week: Not on file    Minutes per session: Not on file  . Stress: Not on file    Relationships  . Social connections:    Talks on phone: Not on file    Gets together: Not on file    Attends religious service: Not on file    Active member of club or organization: Not on file    Attends meetings of clubs or organizations: Not on file    Relationship status: Not on file  Other Topics Concern  . Not on file  Social History Narrative   Exercising-- no   Allergies  Allergen Reactions  . Latex Itching and Rash   Family History  Problem Relation Age of Onset  . Hypertension Father   . Diabetes Father   . Breast cancer Neg Hx      Current Outpatient Medications (Cardiovascular):  .  amLODipine (NORVASC) 5 MG tablet, TAKE 1 TABLET(5 MG) BY MOUTH DAILY .  atorvastatin (LIPITOR) 20 MG tablet, TAKE 1 TABLET(20 MG) BY MOUTH DAILY .  lisinopril (PRINIVIL,ZESTRIL) 20 MG tablet, TAKE 1 TABLET(20 MG) BY MOUTH DAILY  Current Outpatient Medications (Respiratory):  .  PROAIR HFA 108 (90 Base) MCG/ACT inhaler, INHALE 2 PUFFS INTO THE LUNGS EVERY 6 HOURS AS NEEDED FOR WHEEZING OR SHORTNESS OF BREATH  Current Outpatient Medications (Analgesics):  .  aspirin  81 MG chewable tablet, Chew 81 mg by mouth daily. .  meloxicam (MOBIC) 15 MG tablet, TAKE 1 TABLET BY MOUTH EVERY DAY AS NEEDED   Current Outpatient Medications (Other):  .  ciprofloxacin (CIPRO) 250 MG tablet, Take 1 tablet (250 mg total) by mouth 2 (two) times daily. .  Diclofenac Sodium 2 % SOLN, Place 2 g onto the skin 2 (two) times daily. .  fluconazole (DIFLUCAN) 150 MG tablet, Take 1 tablet by mouth once. Repeat in one week if needed. .  Multiple Vitamin (MULTIVITAMIN) tablet, Take 1 tablet by mouth daily. .  varenicline (CHANTIX STARTING MONTH PAK) 0.5 MG X 11 & 1 MG X 42 tablet, Take one 0.5 mg tablet by mouth once daily for 3 days, then increase to one 0.5 mg tablet twice daily for 4 days, then increase to one 1 mg tablet twice daily. .  varenicline (CHANTIX STARTING MONTH PAK) 0.5 MG X 11 & 1 MG X 42 tablet,  Take one 0.5 mg tablet by mouth once daily for 3 days, then increase to one 0.5 mg tablet twice daily for 4 days, then increase to one 1 mg tablet twice daily.    Past medical history, social, surgical and family history all reviewed in electronic medical record.  No pertanent information unless stated regarding to the chief complaint.   Review of Systems:  No headache, visual changes, nausea, vomiting, diarrhea, constipation, dizziness, abdominal pain, skin rash, fevers, chills, night sweats, weight loss, swollen lymph nodes, body aches, joint swelling,, chest pain, shortness of breath, mood changes.  Positive muscle aches  Objective  Blood pressure 110/70, pulse 83, height 5\' 2"  (1.575 m), weight 166 lb (75.3 kg), SpO2 98 %.   General: No apparent distress alert and oriented x3 mood and affect normal, dressed appropriately.  HEENT: Pupils equal, extraocular movements intact  Respiratory: Patient's speak in full sentences and does not appear short of breath  Cardiovascular: No lower extremity edema, non tender, no erythema  Skin: Warm dry intact with no signs of infection or rash on extremities or on axial skeleton.  Abdomen: Soft nontender  Neuro: Cranial nerves II through XII are intact, neurovascularly intact in all extremities with 2+ DTRs and 2+ pulses.  Lymph: No lymphadenopathy of posterior or anterior cervical chain or axillae bilaterally.  Gait normal with good balance and coordination.  MSK:  Non tender with full range of motion and good stability and symmetric strength and tone of shoulders, elbows, wrist, hip, knee bilaterally.  Bilateral ankle exam shows the patient does have overpronation of the hindfoot bilaterally with loss of the longitudinal arch.  Mild breakdown of the transverse arch bilaterally.  Mild tenderness over the medial malleolus just inferior to it.  Limited musculoskeletal ultrasound was performed and interpreted by Judi Saa  Limited musculoskeletal  ultrasound shows still patient having some hypoechoic changes surrounding the posterior tibialis tendon.  Mild increase in Doppler flair but no true intrasubstance tearing noted Pression: Interval improvement of tendinitis   Impression and Recommendations:     This case required medical decision making of moderate complexity. The above documentation has been reviewed and is accurate and complete Judi Saa, DO       Note: This dictation was prepared with Dragon dictation along with smaller phrase technology. Any transcriptional errors that result from this process are unintentional.

## 2018-03-30 ENCOUNTER — Encounter: Payer: Self-pay | Admitting: Family Medicine

## 2018-03-30 ENCOUNTER — Ambulatory Visit: Payer: 59 | Admitting: Family Medicine

## 2018-03-30 DIAGNOSIS — M76821 Posterior tibial tendinitis, right leg: Secondary | ICD-10-CM | POA: Diagnosis not present

## 2018-03-30 NOTE — Patient Instructions (Signed)
Great to see you  You are making progress Ice is your friendd for the foot and heat for the thumb Brace thumb at night See me again in 6 weeks to make sure all there way better

## 2018-03-30 NOTE — Assessment & Plan Note (Signed)
Improvement noted.  Continue conservative therapy.  Topical anti-inflammatories given him discussed the possibility of using lotion to help with the drying out of the skin.  Discussed and encouraged the potential for custom orthotics.  Follow-up with me again in 6 weeks

## 2018-04-02 DIAGNOSIS — L03314 Cellulitis of groin: Secondary | ICD-10-CM | POA: Diagnosis not present

## 2018-05-15 ENCOUNTER — Encounter: Payer: Self-pay | Admitting: Family Medicine

## 2018-05-15 ENCOUNTER — Ambulatory Visit: Payer: 59 | Admitting: Family Medicine

## 2018-05-15 ENCOUNTER — Ambulatory Visit: Payer: Self-pay

## 2018-05-15 VITALS — BP 122/78 | HR 94 | Ht 62.0 in | Wt 169.0 lb

## 2018-05-15 DIAGNOSIS — M65342 Trigger finger, left ring finger: Secondary | ICD-10-CM

## 2018-05-15 DIAGNOSIS — M76821 Posterior tibial tendinitis, right leg: Secondary | ICD-10-CM

## 2018-05-15 DIAGNOSIS — M65352 Trigger finger, left little finger: Secondary | ICD-10-CM

## 2018-05-15 DIAGNOSIS — G8929 Other chronic pain: Secondary | ICD-10-CM

## 2018-05-15 DIAGNOSIS — M65322 Trigger finger, left index finger: Secondary | ICD-10-CM | POA: Diagnosis not present

## 2018-05-15 DIAGNOSIS — M65351 Trigger finger, right little finger: Secondary | ICD-10-CM

## 2018-05-15 DIAGNOSIS — M65311 Trigger thumb, right thumb: Secondary | ICD-10-CM | POA: Diagnosis not present

## 2018-05-15 DIAGNOSIS — M65312 Trigger thumb, left thumb: Secondary | ICD-10-CM

## 2018-05-15 DIAGNOSIS — M65331 Trigger finger, right middle finger: Secondary | ICD-10-CM

## 2018-05-15 DIAGNOSIS — M65332 Trigger finger, left middle finger: Secondary | ICD-10-CM

## 2018-05-15 DIAGNOSIS — M79646 Pain in unspecified finger(s): Principal | ICD-10-CM

## 2018-05-15 DIAGNOSIS — M79645 Pain in left finger(s): Secondary | ICD-10-CM

## 2018-05-15 DIAGNOSIS — M65321 Trigger finger, right index finger: Secondary | ICD-10-CM | POA: Diagnosis not present

## 2018-05-15 DIAGNOSIS — M65341 Trigger finger, right ring finger: Secondary | ICD-10-CM

## 2018-05-15 NOTE — Patient Instructions (Signed)
Good to see you  Ice is yoru friend Ankle should get better and better IOnjected both thumbs today  Will get better over next 2 weeks Se eme again in 6 weeks Happy holidays!

## 2018-05-15 NOTE — Progress Notes (Signed)
Kimberly Rollins [address] [place], [place] [address] Phone: [phone] Subjective:    I Kimberly Rollins am serving as a Neurosurgeon for Dr. Antoine Primas.   I'm seeing this patient by the request  of:    CC: Right ankle pain follow-up, bilateral thumb pain  [id number]  Kimberly Rollins is a 47 y.o. female coming in with complaint of right ankle pain. Ankle is better. Still sore and achy. Thumb pain. Bilateral thumb pain. Right thumb locks with pain. Swelling bilaterally. No numbness and tingling noted. Loss of ROM.  Onset- Chronic Location- MCP of the thumbs bilaterally Duration-  Character- Dull, sharp Aggravating factors- Any movement that requires the thumbs  Reliving factors-  Therapies tried- Ice, pain meds Severity-9 out of 10.  Sometimes stops her from gripping things.  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     Past Medical History:  Diagnosis Date  . Asthma    Seasonal  . Hyperlipidemia   . Hypertension    Past Surgical History:  Procedure Laterality Date  . BREAST REDUCTION SURGERY    . CESAREAN SECTION  1998   Social History   Socioeconomic History  . Marital status: Single    Spouse name: Not on file  . Number of children: Not on file  . Years of education: Not on file  . Highest education level: Not on file  Occupational History  . Occupation: [employer] home    Employer: [employer]  Social Needs  . Financial resource strain: Not on file  . Food insecurity:    Worry: Not on file    Inability: Not on file  . Transportation needs:    Medical: Not on file    Non-medical: Not on file  Tobacco Use  . Smoking status: Current Every Day Smoker    Packs/day: 1.00    Years: 20.00    Pack years: 20.00    Types: Cigarettes  . Smokeless tobacco: Current User    Types: Chew  . Tobacco comment: program at work--pt will try-- trying to  cut down  Substance and Sexual Activity  . Alcohol use: Yes    Alcohol/week: 0.0 standard drinks    Comment: Socially  . Drug use: No  . Sexual activity: Yes    Partners: Female  Lifestyle  . Physical activity:    Days per week: Not on file    Minutes per session: Not on file  . Stress: Not on file  Relationships  . Social connections:    Talks on phone: Not on file    Gets together: Not on file    Attends religious service: Not on file    Active member of club or organization: Not on file    Attends meetings of clubs or organizations: Not on file    Relationship status: Not on file  Other Topics Concern  . Not on file  Social History Narrative   Exercising-- no   Allergies  Allergen Reactions  . Latex Itching and Rash   Family History  Problem Relation Age of Onset  . Hypertension Father   . Diabetes Father   . Breast cancer Neg Hx      Current Outpatient Medications (Cardiovascular):  .  amLODipine (NORVASC) 5 MG tablet, TAKE 1 TABLET(5 MG) BY MOUTH DAILY .  atorvastatin (LIPITOR) 20 MG tablet, TAKE 1 TABLET(20 MG) BY MOUTH DAILY .  lisinopril (PRINIVIL,ZESTRIL) 20 MG tablet, TAKE 1 TABLET(20 MG) BY MOUTH DAILY  Current Outpatient Medications (Respiratory):  .  PROAIR HFA 108 (90 Base) MCG/ACT inhaler, INHALE 2 PUFFS INTO THE LUNGS EVERY 6 HOURS AS NEEDED FOR WHEEZING OR SHORTNESS OF BREATH  Current Outpatient Medications (Analgesics):  .  aspirin 81 MG chewable tablet, Chew 81 mg by mouth daily. .  meloxicam (MOBIC) 15 MG tablet, TAKE 1 TABLET BY MOUTH EVERY DAY AS NEEDED   Current Outpatient Medications (Other):  Marland Kitchen.  Diclofenac Sodium 2 % SOLN, Place 2 g onto the skin 2 (two) times daily. .  fluconazole (DIFLUCAN) 150 MG tablet, Take 1 tablet by mouth once. Repeat in one week if needed. .  Multiple Vitamin (MULTIVITAMIN) tablet, Take 1 tablet by mouth daily. .  varenicline (CHANTIX STARTING MONTH PAK) 0.5 MG X 11 & 1 MG X 42 tablet, Take one 0.5 mg tablet by  mouth once daily for 3 days, then increase to one 0.5 mg tablet twice daily for 4 days, then increase to one 1 mg tablet twice daily. .  varenicline (CHANTIX STARTING MONTH PAK) 0.5 MG X 11 & 1 MG X 42 tablet, Take one 0.5 mg tablet by mouth once daily for 3 days, then increase to one 0.5 mg tablet twice daily for 4 days, then increase to one 1 mg tablet twice daily. .  ciprofloxacin (CIPRO) 250 MG tablet, Take 1 tablet (250 mg total) by mouth 2 (two) times daily. (Patient not taking: Reported on 05/15/2018)    Past medical history, social, surgical and family history all reviewed in electronic medical record.  No pertanent information unless stated regarding to the chief complaint.   Review of Systems:  No headache, visual changes, nausea, vomiting, diarrhea, constipation, dizziness, abdominal pain, skin rash, fevers, chills, night sweats, weight loss, swollen lymph nodes, body aches, joint swelling, muscle aches, chest pain, shortness of breath, mood changes.  Positive muscle aches  Objective  Blood pressure 122/78, pulse 94, height 5\' 2"  (1.575 m), weight 169 lb (76.7 kg), SpO2 98 %.   General: No apparent distress alert and oriented x3 mood and affect normal, dressed appropriately.  HEENT: Pupils equal, extraocular movements intact  Respiratory: Patient's speak in full sentences and does not appear short of breath  Cardiovascular: No lower extremity edema, non tender, no erythema  Skin: Warm dry intact with no signs of infection or rash on extremities or on axial skeleton.  Abdomen: Soft nontender  Neuro: Cranial nerves II through XII are intact, neurovascularly intact in all extremities with 2+ DTRs and 2+ pulses.  Lymph: No lymphadenopathy of posterior or anterior cervical chain or axillae bilaterally.  Gait normal with good balance and coordination.  MSK:  Non tender with full range of motion and good stability and symmetric strength and tone of shoulders, elbows, wrist, hip, knee and  bilaterally.  Ankle exam on the right side shows the patient does have pes planus with overpronation of the hindfoot.  Still some tenderness to palpation over the posterior tibialis tendon.  Bilateral hand exam shows that patient has actually trigger nodules noted of the [facility] joint bilaterally.  Severe on the left greater than right.  Mild positive grind test on both sides but not as tender over the [facility] joint themselves.  Procedure: Real-time Ultrasound Guided Injection of right flexor tendon sheath of the first phalanx Device: GE Logiq Q7 Ultrasound guided injection is preferred based studies that show increased duration, increased effect, greater accuracy, decreased procedural pain,  increased response rate, and decreased cost with ultrasound guided versus blind injection.  Verbal informed consent obtained.  Time-out conducted.  Noted no overlying erythema, induration, or other signs of local infection.  Skin prepped in a sterile fashion.  Local anesthesia: Topical Ethyl chloride.  With sterile technique and under real time ultrasound guidance: With a 25-gauge 1 inch needle patient was injected with 0.5 cc of 0.5% Marcaine and 0.5 cc of Kenalog 40 mg/mL Completed without difficulty  Pain immediately resolved suggesting accurate placement of the medication.  Advised to call if fevers/chills, erythema, induration, drainage, or persistent bleeding.  Images permanently stored and available for review in the ultrasound unit.  Impression: Technically successful ultrasound guided injection.  Procedure: Real-time Ultrasound Guided Injection of left flexor tendon sheath Device: GE Logiq Q7 Ultrasound guided injection is preferred based studies that show increased duration, increased effect, greater accuracy, decreased procedural pain, increased response rate, and decreased cost with ultrasound guided versus blind injection.  Verbal informed consent obtained.  Time-out conducted.  Noted no overlying  erythema, induration, or other signs of local infection.  Skin prepped in a sterile fashion.  Local anesthesia: Topical Ethyl chloride.  With sterile technique and under real time ultrasound guidance:   Completed without difficulty  Pain immediately resolved suggesting accurate placement of the medication.  Advised to call if fevers/chills, erythema, induration, drainage, or persistent bleeding.  Images permanently stored and available for review in the ultrasound unit.  Impression: Technically successful ultrasound guided injection.    Impression and Recommendations:     This case required medical decision making of moderate complexity. The above documentation has been reviewed and is accurate and complete Kimberly Saa, DO       Note: This dictation was prepared with Dragon dictation along with smaller phrase technology. Any transcriptional errors that result from this process are unintentional.

## 2018-05-16 ENCOUNTER — Encounter: Payer: Self-pay | Admitting: Family Medicine

## 2018-05-16 DIAGNOSIS — M65311 Trigger thumb, right thumb: Secondary | ICD-10-CM | POA: Insufficient documentation

## 2018-05-16 DIAGNOSIS — M65321 Trigger finger, right index finger: Secondary | ICD-10-CM

## 2018-05-16 DIAGNOSIS — M65342 Trigger finger, left ring finger: Secondary | ICD-10-CM

## 2018-05-16 DIAGNOSIS — M65351 Trigger finger, right little finger: Secondary | ICD-10-CM

## 2018-05-16 DIAGNOSIS — M65341 Trigger finger, right ring finger: Secondary | ICD-10-CM

## 2018-05-16 DIAGNOSIS — M65322 Trigger finger, left index finger: Secondary | ICD-10-CM

## 2018-05-16 DIAGNOSIS — M65332 Trigger finger, left middle finger: Secondary | ICD-10-CM

## 2018-05-16 DIAGNOSIS — M65312 Trigger thumb, left thumb: Secondary | ICD-10-CM

## 2018-05-16 DIAGNOSIS — M65331 Trigger finger, right middle finger: Secondary | ICD-10-CM

## 2018-05-16 DIAGNOSIS — M65352 Trigger finger, left little finger: Secondary | ICD-10-CM

## 2018-05-16 NOTE — Assessment & Plan Note (Signed)
Bilateral injections given today.  Tolerated the procedure well.  Discussed bracing at night, topical anti-inflammatories, hand massage and decreasing repetitive activities.  Follow-up again in 4 weeks

## 2018-05-16 NOTE — Assessment & Plan Note (Signed)
Improved and stable.  No change in management.

## 2018-06-27 ENCOUNTER — Ambulatory Visit: Payer: Self-pay | Admitting: Family Medicine

## 2018-07-10 DIAGNOSIS — H5203 Hypermetropia, bilateral: Secondary | ICD-10-CM | POA: Diagnosis not present

## 2018-07-14 DIAGNOSIS — J45991 Cough variant asthma: Secondary | ICD-10-CM | POA: Diagnosis not present

## 2018-07-15 ENCOUNTER — Other Ambulatory Visit: Payer: Self-pay | Admitting: Family Medicine

## 2018-07-15 DIAGNOSIS — I10 Essential (primary) hypertension: Secondary | ICD-10-CM

## 2018-07-17 ENCOUNTER — Encounter: Payer: Self-pay | Admitting: Emergency Medicine

## 2018-07-17 ENCOUNTER — Other Ambulatory Visit: Payer: Self-pay

## 2018-07-17 ENCOUNTER — Ambulatory Visit (INDEPENDENT_AMBULATORY_CARE_PROVIDER_SITE_OTHER): Payer: 59

## 2018-07-17 ENCOUNTER — Inpatient Hospital Stay
Admission: EM | Admit: 2018-07-17 | Discharge: 2018-07-22 | DRG: 193 | Disposition: A | Discharge status: Home / Self Care | Payer: 59 | Attending: Internal Medicine | Admitting: Internal Medicine

## 2018-07-17 ENCOUNTER — Ambulatory Visit
Admission: EM | Admit: 2018-07-17 | Discharge: 2018-07-17 | Disposition: A | Discharge status: Home / Self Care | Payer: 59 | Attending: Family Medicine | Admitting: Family Medicine

## 2018-07-17 DIAGNOSIS — J1000 Influenza due to other identified influenza virus with unspecified type of pneumonia: Principal | ICD-10-CM | POA: Diagnosis present

## 2018-07-17 DIAGNOSIS — Z79899 Other long term (current) drug therapy: Secondary | ICD-10-CM | POA: Diagnosis not present

## 2018-07-17 DIAGNOSIS — Z91040 Latex allergy status: Secondary | ICD-10-CM

## 2018-07-17 DIAGNOSIS — J189 Pneumonia, unspecified organism: Secondary | ICD-10-CM

## 2018-07-17 DIAGNOSIS — Z87891 Personal history of nicotine dependence: Secondary | ICD-10-CM | POA: Diagnosis not present

## 2018-07-17 DIAGNOSIS — R000 Tachycardia, unspecified: Secondary | ICD-10-CM | POA: Diagnosis not present

## 2018-07-17 DIAGNOSIS — J441 Chronic obstructive pulmonary disease with (acute) exacerbation: Secondary | ICD-10-CM | POA: Diagnosis not present

## 2018-07-17 DIAGNOSIS — Z7982 Long term (current) use of aspirin: Secondary | ICD-10-CM

## 2018-07-17 DIAGNOSIS — Z7952 Long term (current) use of systemic steroids: Secondary | ICD-10-CM | POA: Diagnosis not present

## 2018-07-17 DIAGNOSIS — E876 Hypokalemia: Secondary | ICD-10-CM

## 2018-07-17 DIAGNOSIS — Z833 Family history of diabetes mellitus: Secondary | ICD-10-CM

## 2018-07-17 DIAGNOSIS — I10 Essential (primary) hypertension: Secondary | ICD-10-CM | POA: Diagnosis not present

## 2018-07-17 DIAGNOSIS — Z8249 Family history of ischemic heart disease and other diseases of the circulatory system: Secondary | ICD-10-CM | POA: Diagnosis not present

## 2018-07-17 DIAGNOSIS — Z720 Tobacco use: Secondary | ICD-10-CM | POA: Diagnosis not present

## 2018-07-17 DIAGNOSIS — A419 Sepsis, unspecified organism: Secondary | ICD-10-CM | POA: Diagnosis not present

## 2018-07-17 DIAGNOSIS — J9601 Acute respiratory failure with hypoxia: Secondary | ICD-10-CM | POA: Diagnosis not present

## 2018-07-17 DIAGNOSIS — I248 Other forms of acute ischemic heart disease: Secondary | ICD-10-CM | POA: Diagnosis not present

## 2018-07-17 DIAGNOSIS — E785 Hyperlipidemia, unspecified: Secondary | ICD-10-CM | POA: Diagnosis present

## 2018-07-17 DIAGNOSIS — J4541 Moderate persistent asthma with (acute) exacerbation: Secondary | ICD-10-CM

## 2018-07-17 DIAGNOSIS — R310 Gross hematuria: Secondary | ICD-10-CM | POA: Diagnosis not present

## 2018-07-17 DIAGNOSIS — R0682 Tachypnea, not elsewhere classified: Secondary | ICD-10-CM | POA: Diagnosis not present

## 2018-07-17 DIAGNOSIS — R0602 Shortness of breath: Secondary | ICD-10-CM | POA: Diagnosis not present

## 2018-07-17 LAB — COMPREHENSIVE METABOLIC PANEL
ALT: 29 U/L (ref 0–44)
AST: 26 U/L (ref 15–41)
Albumin: 3.7 g/dL (ref 3.5–5.0)
Alkaline Phosphatase: 96 U/L (ref 38–126)
Anion gap: 12 (ref 5–15)
BUN: 8 mg/dL (ref 6–20)
CO2: 22 mmol/L (ref 22–32)
Calcium: 9.3 mg/dL (ref 8.9–10.3)
Chloride: 104 mmol/L (ref 98–111)
Creatinine, Ser: 0.5 mg/dL (ref 0.44–1.00)
GFR calc Af Amer: >60 mL/min (ref >60)
GFR calc non Af Amer: >60 mL/min (ref >60)
Glucose, Bld: 186 mg/dL — ABNORMAL HIGH (ref 70–99)
Potassium: 3 mmol/L — ABNORMAL LOW (ref 3.5–5.1)
Sodium: 138 mmol/L (ref 135–145)
Total Bilirubin: 0.8 mg/dL (ref 0.3–1.2)
Total Protein: 7.7 g/dL (ref 6.5–8.1)

## 2018-07-17 LAB — CBC WITH DIFFERENTIAL/PLATELET
Abs Immature Granulocytes: 0.11 10*3/uL — ABNORMAL HIGH (ref 0.00–0.07)
Basophils Absolute: 0 10*3/uL (ref 0.0–0.1)
Basophils Relative: 0 %
Eosinophils Absolute: 0.2 10*3/uL (ref 0.0–0.5)
Eosinophils Relative: 1 %
HCT: 45.3 % (ref 36.0–46.0)
Hemoglobin: 16.4 g/dL — ABNORMAL HIGH (ref 12.0–15.0)
Immature Granulocytes: 1 %
Lymphocytes Relative: 9 %
Lymphs Abs: 1.7 10*3/uL (ref 0.7–4.0)
MCH: 31.9 pg (ref 26.0–34.0)
MCHC: 36.2 g/dL — ABNORMAL HIGH (ref 30.0–36.0)
MCV: 88.1 fL (ref 80.0–100.0)
Monocytes Absolute: 0.7 10*3/uL (ref 0.1–1.0)
Monocytes Relative: 4 %
Neutro Abs: 16.5 10*3/uL — ABNORMAL HIGH (ref 1.7–7.7)
Neutrophils Relative %: 85 %
Platelets: 199 10*3/uL (ref 150–400)
RBC: 5.14 MIL/uL — ABNORMAL HIGH (ref 3.87–5.11)
RDW: 13.5 % (ref 11.5–15.5)
WBC: 19.1 10*3/uL — ABNORMAL HIGH (ref 4.0–10.5)
nRBC: 0 % (ref 0.0–0.2)

## 2018-07-17 LAB — INFLUENZA PANEL BY PCR (TYPE A & B)
Influenza A By PCR: POSITIVE — AB
Influenza B By PCR: NEGATIVE

## 2018-07-17 LAB — TROPONIN I
Troponin I: 0.1 ng/mL (ref <0.03)
Troponin I: 0.1 ng/mL (ref <0.03)

## 2018-07-17 LAB — LACTIC ACID, PLASMA
LACTIC ACID, VENOUS: 1.9 mmol/L (ref 0.5–1.9)
Lactic Acid, Venous: 1.3 mmol/L (ref 0.5–1.9)

## 2018-07-17 MED ORDER — ATORVASTATIN CALCIUM 20 MG PO TABS
20.0000 mg | ORAL_TABLET | Freq: Every day | ORAL | Status: DC
  Administered 2018-07-18 – 2018-07-21 (×4): 20 mg via ORAL
  Filled 2018-07-17 (×4): qty 1

## 2018-07-17 MED ORDER — METHYLPREDNISOLONE SODIUM SUCC 125 MG IJ SOLR
80.0000 mg | Freq: Once | INTRAMUSCULAR | Status: AC
  Administered 2018-07-17: 80 mg via INTRAVENOUS
  Filled 2018-07-17: qty 2

## 2018-07-17 MED ORDER — ASPIRIN 81 MG PO CHEW
81.0000 mg | CHEWABLE_TABLET | Freq: Every day | ORAL | Status: DC
  Administered 2018-07-17 – 2018-07-22 (×6): 81 mg via ORAL
  Filled 2018-07-17 (×6): qty 1

## 2018-07-17 MED ORDER — LISINOPRIL 20 MG PO TABS
20.0000 mg | ORAL_TABLET | Freq: Every day | ORAL | Status: DC
  Administered 2018-07-17 – 2018-07-22 (×6): 20 mg via ORAL
  Filled 2018-07-17 (×6): qty 1

## 2018-07-17 MED ORDER — ONDANSETRON HCL 4 MG/2ML IJ SOLN: 4.0000 mg | Freq: Four times a day (QID) | INTRAMUSCULAR | Status: DC | PRN

## 2018-07-17 MED ORDER — IPRATROPIUM-ALBUTEROL 0.5-2.5 (3) MG/3ML IN SOLN
3.0000 mL | Freq: Once | RESPIRATORY_TRACT | Status: AC
  Administered 2018-07-17: 3 mL via RESPIRATORY_TRACT
  Filled 2018-07-17: qty 3

## 2018-07-17 MED ORDER — ENOXAPARIN SODIUM 40 MG/0.4ML ~~LOC~~ SOLN
40.0000 mg | SUBCUTANEOUS | Status: DC
  Administered 2018-07-17 – 2018-07-21 (×5): 40 mg via SUBCUTANEOUS
  Filled 2018-07-17 (×5): qty 0.4

## 2018-07-17 MED ORDER — OSELTAMIVIR PHOSPHATE 75 MG PO CAPS
75.0000 mg | ORAL_CAPSULE | Freq: Two times a day (BID) | ORAL | Status: AC
  Administered 2018-07-17 – 2018-07-22 (×10): 75 mg via ORAL
  Filled 2018-07-17 (×10): qty 1

## 2018-07-17 MED ORDER — POTASSIUM CHLORIDE CRYS ER 20 MEQ PO TBCR
40.0000 meq | EXTENDED_RELEASE_TABLET | ORAL | Status: AC
  Administered 2018-07-17 (×2): 40 meq via ORAL
  Filled 2018-07-17 (×2): qty 2

## 2018-07-17 MED ORDER — ONDANSETRON HCL 4 MG PO TABS: 4.0000 mg | ORAL_TABLET | Freq: Four times a day (QID) | ORAL | Status: DC | PRN

## 2018-07-17 MED ORDER — ACETAMINOPHEN 325 MG PO TABS
650.0000 mg | ORAL_TABLET | Freq: Four times a day (QID) | ORAL | Status: DC | PRN
  Administered 2018-07-18 – 2018-07-22 (×13): 650 mg via ORAL
  Filled 2018-07-17 (×11): qty 2

## 2018-07-17 MED ORDER — MELOXICAM 7.5 MG PO TABS
15.0000 mg | ORAL_TABLET | Freq: Two times a day (BID) | ORAL | Status: DC | PRN
  Filled 2018-07-17: qty 2

## 2018-07-17 MED ORDER — IPRATROPIUM-ALBUTEROL 0.5-2.5 (3) MG/3ML IN SOLN
3.0000 mL | Freq: Four times a day (QID) | RESPIRATORY_TRACT | Status: DC
  Administered 2018-07-17 – 2018-07-18 (×3): 3 mL via RESPIRATORY_TRACT
  Filled 2018-07-17 (×3): qty 3

## 2018-07-17 MED ORDER — ALBUTEROL SULFATE (2.5 MG/3ML) 0.083% IN NEBU: 2.5000 mg | INHALATION_SOLUTION | RESPIRATORY_TRACT | Status: DC | PRN

## 2018-07-17 MED ORDER — SODIUM CHLORIDE 0.9 % IV SOLN
500.0000 mg | INTRAVENOUS | Status: DC
  Administered 2018-07-17 – 2018-07-18 (×2): 500 mg via INTRAVENOUS
  Filled 2018-07-17 (×2): qty 500

## 2018-07-17 MED ORDER — POLYETHYLENE GLYCOL 3350 17 G PO PACK: 17.0000 g | PACK | Freq: Every day | ORAL | Status: DC | PRN

## 2018-07-17 MED ORDER — PREDNISONE 50 MG PO TABS
50.0000 mg | ORAL_TABLET | Freq: Every day | ORAL | Status: DC
  Administered 2018-07-18 – 2018-07-20 (×3): 50 mg via ORAL
  Filled 2018-07-17 (×3): qty 1

## 2018-07-17 MED ORDER — AMLODIPINE BESYLATE 5 MG PO TABS
5.0000 mg | ORAL_TABLET | Freq: Every day | ORAL | Status: DC
  Administered 2018-07-18 – 2018-07-22 (×5): 5 mg via ORAL
  Filled 2018-07-17 (×5): qty 1

## 2018-07-17 MED ORDER — SODIUM CHLORIDE 0.9 % IV BOLUS
1000.0000 mL | Freq: Once | INTRAVENOUS | Status: AC
  Administered 2018-07-17: 1000 mL via INTRAVENOUS

## 2018-07-17 MED ORDER — IPRATROPIUM-ALBUTEROL 0.5-2.5 (3) MG/3ML IN SOLN
3.0000 mL | Freq: Once | RESPIRATORY_TRACT | Status: DC
  Administered 2018-07-17: 3 mL via RESPIRATORY_TRACT
  Filled 2018-07-17: qty 3

## 2018-07-17 MED ORDER — SODIUM CHLORIDE 0.9 % IV BOLUS
500.0000 mL | Freq: Once | INTRAVENOUS | Status: AC
  Administered 2018-07-17: 500 mL via INTRAVENOUS

## 2018-07-17 MED ORDER — ACETAMINOPHEN 500 MG PO TABS
ORAL_TABLET | ORAL | Status: AC
  Administered 2018-07-17: 1000 mg via ORAL
  Filled 2018-07-17: qty 2

## 2018-07-17 MED ORDER — SODIUM CHLORIDE 0.9 % IV SOLN
2.0000 g | INTRAVENOUS | Status: DC
  Administered 2018-07-17 – 2018-07-21 (×5): 2 g via INTRAVENOUS
  Filled 2018-07-17 (×2): qty 20
  Filled 2018-07-17: qty 2
  Filled 2018-07-17 (×2): qty 20
  Filled 2018-07-17: qty 2

## 2018-07-17 MED ORDER — ACETAMINOPHEN 500 MG PO TABS
1000.0000 mg | ORAL_TABLET | Freq: Once | ORAL | Status: AC
  Administered 2018-07-17: 1000 mg via ORAL

## 2018-07-17 MED ORDER — ACETAMINOPHEN 650 MG RE SUPP: 650.0000 mg | Freq: Four times a day (QID) | RECTAL | Status: DC | PRN

## 2018-07-17 NOTE — H&P (Signed)
[facility] NAME: Kimberly Rollins    MR#:  [id number]  DATE OF BIRTH:  05-16-1971  DATE OF ADMISSION:  07/17/2018  PRIMARY CARE PHYSICIAN: Ann Held, DO   REQUESTING/REFERRING PHYSICIAN: Dr. Quentin Cornwall  CHIEF COMPLAINT:   Chief Complaint  Patient presents with  . Shortness of Breath    HISTORY OF PRESENT ILLNESS:  Kimberly Rollins  is a 48 y.o. female with a known history of Hypertension, HL, Asthma here with 3 days of SOB, cough and chest pain with coughing. Had a phone MD consult on Saturday and was prescribed prednisone. Patient continued to worsen and went to urgent care today.  There x-ray showed bilateral pneumonia.  She had conversational dyspnea with wheezing.  Met sepsis criteria and sent to the emergency room here. Temperature 99.5.  Influenza pending.  PAST MEDICAL HISTORY:   Past Medical History:  Diagnosis Date  . Asthma    Seasonal  . Hyperlipidemia   . Hypertension     PAST SURGICAL HISTORY:   Past Surgical History:  Procedure Laterality Date  . BREAST REDUCTION SURGERY    . CESAREAN SECTION  1998    SOCIAL HISTORY:   Social History   Tobacco Use  . Smoking status: Former Smoker    Packs/day: 1.00    Years: 20.00    Pack years: 20.00    Types: Cigarettes    Last attempt to quit: 07/10/2018    Years since quitting: 0.0  . Smokeless tobacco: Never Used  . Tobacco comment: program at work--pt will try-- trying to cut down  Substance Use Topics  . Alcohol use: Yes    Alcohol/week: 0.0 standard drinks    Comment: Socially    FAMILY HISTORY:   Family History  Problem Relation Age of Onset  . Hypertension Father   . Diabetes Father   . COPD Mother   . Breast cancer Neg Hx     DRUG ALLERGIES:   Allergies  Allergen Reactions  . Latex Itching and Rash    REVIEW OF SYSTEMS:   Review of Systems  Constitutional: Positive for chills and malaise/fatigue. Negative for fever  and weight loss.  HENT: Negative for hearing loss and nosebleeds.   Eyes: Negative for blurred vision, double vision and pain.  Respiratory: Positive for cough, sputum production, shortness of breath and wheezing. Negative for hemoptysis.   Cardiovascular: Positive for chest pain. Negative for palpitations, orthopnea and leg swelling.  Gastrointestinal: Negative for abdominal pain, constipation, diarrhea, nausea and vomiting.  Genitourinary: Negative for dysuria and hematuria.  Musculoskeletal: Positive for back pain and myalgias. Negative for falls.  Skin: Negative for rash.  Neurological: Negative for dizziness, tremors, sensory change, speech change, focal weakness, seizures and headaches.  Endo/Heme/Allergies: Does not bruise/bleed easily.  Psychiatric/Behavioral: Negative for depression and memory loss. The patient is not nervous/anxious.     MEDICATIONS AT HOME:   Prior to Admission medications   Medication Sig Start Date End Date Taking? Authorizing Provider  amLODipine (NORVASC) 5 MG tablet TAKE 1 TABLET(5 MG) BY MOUTH DAILY 07/17/18  Yes Ann Held, DO  aspirin 81 MG chewable tablet Chew 81 mg by mouth daily.   Yes [provider]  atorvastatin (LIPITOR) 20 MG tablet TAKE 1 TABLET(20 MG) BY MOUTH DAILY 11/10/17  Yes Carollee Herter, Yvonne R, DO  lisinopril (PRINIVIL,ZESTRIL) 20 MG tablet TAKE 1 TABLET(20 MG) BY MOUTH DAILY 07/17/18  Yes Ann Held, DO  Multiple Vitamin (MULTIVITAMIN) tablet Take 1 tablet by mouth daily.   Yes [provider]  PROAIR HFA 108 (90 Base) MCG/ACT inhaler INHALE 2 PUFFS INTO THE LUNGS EVERY 6 HOURS AS NEEDED FOR WHEEZING OR SHORTNESS OF BREATH 03/16/18  Yes Roma Schanz R, DO  ciprofloxacin (CIPRO) 250 MG tablet Take 1 tablet (250 mg total) by mouth 2 (two) times daily. Patient not taking: Reported on 05/15/2018 02/02/18   Roma Schanz R, DO  Diclofenac Sodium 2 % SOLN Place 2 g onto the skin 2 (two) times  daily. Patient not taking: Reported on 07/17/2018 03/01/18   Lyndal Pulley, DO  fluconazole (DIFLUCAN) 150 MG tablet Take 1 tablet by mouth once. Repeat in one week if needed. Patient not taking: Reported on 07/17/2018 02/02/18   Carollee Herter, Alferd Apa, DO  meloxicam (MOBIC) 15 MG tablet TAKE 1 TABLET BY MOUTH EVERY DAY AS NEEDED 02/26/18   Carollee Herter, Alferd Apa, DO  varenicline (CHANTIX STARTING MONTH PAK) 0.5 MG X 11 & 1 MG X 42 tablet Take one 0.5 mg tablet by mouth once daily for 3 days, then increase to one 0.5 mg tablet twice daily for 4 days, then increase to one 1 mg tablet twice daily. Patient not taking: Reported on 07/17/2018 10/17/16   Carollee Herter, Alferd Apa, DO  varenicline (CHANTIX STARTING MONTH PAK) 0.5 MG X 11 & 1 MG X 42 tablet Take one 0.5 mg tablet by mouth once daily for 3 days, then increase to one 0.5 mg tablet twice daily for 4 days, then increase to one 1 mg tablet twice daily. Patient not taking: Reported on 07/17/2018 01/30/18   Ann Held, DO     VITAL SIGNS:  Blood pressure (!) 139/92, pulse (!) 131, temperature 99.9 F (37.7 C), temperature source Oral, resp. rate 20, height '5\' 3"'  (1.6 m), weight 72.6 kg, last menstrual period 07/16/2018, SpO2 95 %.  PHYSICAL EXAMINATION:  Physical Exam  GENERAL:  48 y.o.-year-old patient lying in the bed with conversational dyspnea. EYES: Pupils equal, round, reactive to light and accommodation. No scleral icterus. Extraocular muscles intact.  HEENT: Head atraumatic, normocephalic. Oropharynx and nasopharynx clear. No oropharyngeal erythema, moist oral mucosa.  NECK:  Supple, no jugular venous distention. No thyroid enlargement, no tenderness.  LUNGS: Bilateral wheezing. CARDIOVASCULAR: S1, S2 normal. No murmurs, rubs, or gallops.  ABDOMEN: Soft, nontender, nondistended. Bowel sounds present. No organomegaly or mass.  EXTREMITIES: No pedal edema, cyanosis, or clubbing. + 2 pedal & radial pulses b/l.   NEUROLOGIC: Cranial nerves  II through XII are intact. No focal Motor or sensory deficits appreciated b/l PSYCHIATRIC: The patient is alert and oriented x 3. Good affect.  SKIN: No obvious rash, lesion, or ulcer.   LABORATORY PANEL:   CBC Recent Labs  Lab 07/17/18 1042  WBC 19.1*  HGB 16.4*  HCT 45.3  PLT 199   ------------------------------------------------------------------------------------------------------------------  Chemistries  Recent Labs  Lab 07/17/18 1042  NA 138  K 3.0*  CL 104  CO2 22  GLUCOSE 186*  BUN 8  CREATININE 0.50  CALCIUM 9.3  AST 26  ALT 29  ALKPHOS 96  BILITOT 0.8   ------------------------------------------------------------------------------------------------------------------  Cardiac Enzymes No results for input(s): TROPONINI in the last 168 hours. ------------------------------------------------------------------------------------------------------------------  RADIOLOGY:  Dg Chest 2 View  Result Date: 07/17/2018 CLINICAL DATA:  Shortness of breath.  Asthma. EXAM: CHEST - 2 VIEW COMPARISON:  None. FINDINGS: Heart size normal. There are multifocal bilateral patchy airspace densities  identified left greater than right. Diffuse bronchial wall thickening is noted. No pleural effusion or edema. The visualized osseous structures are unremarkable. IMPRESSION: 1. Bilateral multifocal patchy airspace densities are noted, left greater than right. Findings worrisome for multifocal infection. Followup PA and lateral chest X-ray is recommended in 3-4 weeks following trial of antibiotic therapy to ensure resolution and exclude underlying malignancy. 2. Diffuse bronchial wall thickening identified which is compatible with the history of reactive airways disease and/or bronchitis. Electronically Signed   By: Kerby Moors M.D.   On: 07/17/2018 11:16     IMPRESSION AND PLAN:   *Bilateral community-acquired pneumonia with asthma exacerbation and sepsis IV fluid bolus.  Start IV  ceftriaxone and azithromycin.  Start prednisone.  Received IV Solu-Medrol in the emergency room.  Nebulizers added.  Pneumonia order set used.  *Hypertension.  Continue home medications  * Hyperlipidemia.  Statin therapy  DVT prophylaxis with Lovenox  All the records are reviewed and case discussed with ED provider. Management plans discussed with the patient, family and they are in agreement.  CODE STATUS: Full code  TOTAL TIME TAKING CARE OF THIS PATIENT: 40 minutes.   Leia Alf Lovie Zarling M.D on 07/17/2018 at 4:22 PM  Between 7am to 6pm - Pager - [phone]  After 6pm go to [email or web address] - password [email or web address] [facility]  Office  [phone]  CC: Primary care physician; Ann Held, DO  Note: This dictation was prepared with Dragon dictation along with smaller phrase technology. Any transcriptional errors that result from this process are unintentional.

## 2018-07-17 NOTE — ED Provider Notes (Signed)
[facility]    CSN: [id number] Arrival date & time: 07/17/18  7867     History   Chief Complaint Chief Complaint  Patient presents with  . Cough  . Shortness of Breath    HPI Kimberly Rollins is a 48 y.o. female.   HPI  Female presents today with cough shortness of breath body aches chills and headache that she has had for 3 days.  States that she did not receive a flu shot this year.  Is a history of asthma.  Had a tele-med doctors conversation on Saturday the day of the onset and she was prescribed prednisone.  The prednisone did help but caused her to have shivers as well as increased appetite.  Time and has not been very beneficial.  Today she is afebrile and has not been febrile during the entire illness.  Temperature is 98.2 today pulse rate of 117 respirations at 36 and O2 sats 95%.  No urinary symptoms but states that she is on her period and has noticed blood in her urine.       Past Medical History:  Diagnosis Date  . Asthma    Seasonal  . Hyperlipidemia   . Hypertension     Patient Active Problem List   Diagnosis Date Noted  . Trigger finger of all digits of both hands 05/16/2018  . Tibialis posterior tendinitis, right 03/01/2018  . Lateral epicondylitis of right elbow 03/09/2015  . Tobacco abuse 08/04/2014  . Asthma in adult 04/05/2011  . HTN (hypertension) 04/05/2011  . Hyperlipidemia 04/05/2011  . Pain in joint, lower leg 02/05/2009  . PREPATELLAR BURSITIS 02/05/2009  . OTHER ANKLE SPRAIN AND STRAIN 02/05/2009    Past Surgical History:  Procedure Laterality Date  . BREAST REDUCTION SURGERY    . CESAREAN SECTION  1998    OB History   No obstetric history on file.      Home Medications    Prior to Admission medications   Medication Sig Start Date End Date Taking? Authorizing Provider  aspirin 81 MG chewable tablet Chew 81 mg by mouth daily.   Yes [provider]  atorvastatin (LIPITOR) 20 MG tablet TAKE 1 TABLET(20  MG) BY MOUTH DAILY 11/10/17  Yes Zola Button, Yvonne R, DO  meloxicam (MOBIC) 15 MG tablet TAKE 1 TABLET BY MOUTH EVERY DAY AS NEEDED 02/26/18  Yes Donato Schultz, DO  Multiple Vitamin (MULTIVITAMIN) tablet Take 1 tablet by mouth daily.   Yes [provider]  predniSONE (DELTASONE) 20 MG tablet Take 1 tablet by mouth 3 (three) times daily. 07/14/18  Yes [provider]  PROAIR HFA 108 (90 Base) MCG/ACT inhaler INHALE 2 PUFFS INTO THE LUNGS EVERY 6 HOURS AS NEEDED FOR WHEEZING OR SHORTNESS OF BREATH 03/16/18  Yes Seabron Spates R, DO  amLODipine (NORVASC) 5 MG tablet TAKE 1 TABLET(5 MG) BY MOUTH DAILY 07/17/18   Zola Button, Grayling Congress, DO  ciprofloxacin (CIPRO) 250 MG tablet Take 1 tablet (250 mg total) by mouth 2 (two) times daily. Patient not taking: Reported on 05/15/2018 02/02/18   Seabron Spates R, DO  Diclofenac Sodium 2 % SOLN Place 2 g onto the skin 2 (two) times daily. 03/01/18   Judi Saa, DO  fluconazole (DIFLUCAN) 150 MG tablet Take 1 tablet by mouth once. Repeat in one week if needed. 02/02/18   Seabron Spates R, DO  lisinopril (PRINIVIL,ZESTRIL) 20 MG tablet TAKE 1 TABLET(20 MG) BY MOUTH DAILY 07/17/18  Zola ButtonLowne Chase, Yvonne R, DO  varenicline (CHANTIX STARTING MONTH PAK) 0.5 MG X 11 & 1 MG X 42 tablet Take one 0.5 mg tablet by mouth once daily for 3 days, then increase to one 0.5 mg tablet twice daily for 4 days, then increase to one 1 mg tablet twice daily. 10/17/16   Donato SchultzLowne Chase, Yvonne R, DO  varenicline (CHANTIX STARTING MONTH PAK) 0.5 MG X 11 & 1 MG X 42 tablet Take one 0.5 mg tablet by mouth once daily for 3 days, then increase to one 0.5 mg tablet twice daily for 4 days, then increase to one 1 mg tablet twice daily. 01/30/18   Donato SchultzLowne Chase, Yvonne R, DO    Family History Family History  Problem Relation Age of Onset  . Hypertension Father   . Diabetes Father   . COPD Mother   . Breast cancer Neg Hx     Social History Social History   Tobacco  Use  . Smoking status: Former Smoker    Packs/day: 1.00    Years: 20.00    Pack years: 20.00    Types: Cigarettes    Last attempt to quit: 07/10/2018    Years since quitting: 0.0  . Smokeless tobacco: Never Used  . Tobacco comment: program at work--pt will try-- trying to cut down  Substance Use Topics  . Alcohol use: Yes    Alcohol/week: 0.0 standard drinks    Comment: Socially  . Drug use: No     Allergies   Latex   Review of Systems Review of Systems  Constitutional: Positive for activity change, chills and fatigue. Negative for fever.  HENT: Negative for congestion, postnasal drip, rhinorrhea and sore throat.   Respiratory: Positive for shortness of breath and wheezing. Negative for cough.   All other systems reviewed and are negative.    Physical Exam Triage Vital Signs ED Triage Vitals  Enc Vitals Group     BP 07/17/18 0952 (!) 116/98     Pulse Rate 07/17/18 0952 (!) 117     Resp 07/17/18 0952 (!) 36     Temp 07/17/18 0952 98.2 F (36.8 C)     Temp Source 07/17/18 0952 Oral     SpO2 07/17/18 0952 95 %     Weight 07/17/18 0953 160 lb (72.6 kg)     Height 07/17/18 0953 5\' 3"  (1.6 m)     Head Circumference --      Peak Flow --      Pain Score 07/17/18 0953 10     Pain Loc --      Pain Edu? --      Excl. in GC? --    No data found.  Updated Vital Signs BP (!) 116/98 (BP Location: Left Arm)   Pulse (!) 117   Temp 98.2 F (36.8 C) (Oral)   Resp (!) 36   Ht 5\' 3"  (1.6 m)   Wt 160 lb (72.6 kg)   LMP 07/16/2018 (Exact Date)   SpO2 95%   BMI 28.34 kg/m   Visual Acuity Right Eye Distance:   Left Eye Distance:   Bilateral Distance:    Right Eye Near:   Left Eye Near:    Bilateral Near:     Physical Exam Vitals signs and nursing note reviewed.  Constitutional:      General: She is not in acute distress.    Appearance: She is well-developed. She is not ill-appearing, toxic-appearing or diaphoretic.  HENT:     Head:  Normocephalic and atraumatic.       Mouth/Throat:     Mouth: Mucous membranes are moist.     Pharynx: Oropharynx is clear.  Eyes:     Pupils: Pupils are equal, round, and reactive to light.  Neck:     Musculoskeletal: Normal range of motion.  Pulmonary:     Effort: Tachypnea present.     Breath sounds: Examination of the left-upper field reveals rales. Examination of the left-lower field reveals wheezing. Wheezing and rales present.  Musculoskeletal: Normal range of motion.  Lymphadenopathy:     Cervical: No cervical adenopathy.  Skin:    General: Skin is warm and dry.  Neurological:     General: No focal deficit present.     Mental Status: She is alert and oriented to person, place, and time.  Psychiatric:        Mood and Affect: Mood normal.        Behavior: Behavior normal.      UC Treatments / Results  Labs (all labs ordered are listed, but only abnormal results are displayed) Labs Reviewed  CBC WITH DIFFERENTIAL/PLATELET - Abnormal; Notable for the following components:      Result Value   WBC 19.1 (*)    RBC 5.14 (*)    Hemoglobin 16.4 (*)    MCHC 36.2 (*)    Neutro Abs 16.5 (*)    Abs Immature Granulocytes 0.11 (*)    All other components within normal limits  COMPREHENSIVE METABOLIC PANEL - Abnormal; Notable for the following components:   Potassium 3.0 (*)    Glucose, Bld 186 (*)    All other components within normal limits    EKG None  Radiology Dg Chest 2 View  Result Date: 07/17/2018 CLINICAL DATA:  Shortness of breath.  Asthma. EXAM: CHEST - 2 VIEW COMPARISON:  None. FINDINGS: Heart size normal. There are multifocal bilateral patchy airspace densities identified left greater than right. Diffuse bronchial wall thickening is noted. No pleural effusion or edema. The visualized osseous structures are unremarkable. IMPRESSION: 1. Bilateral multifocal patchy airspace densities are noted, left greater than right. Findings worrisome for multifocal infection. Followup PA and lateral chest X-ray  is recommended in 3-4 weeks following trial of antibiotic therapy to ensure resolution and exclude underlying malignancy. 2. Diffuse bronchial wall thickening identified which is compatible with the history of reactive airways disease and/or bronchitis. Electronically Signed   By: Signa Kellaylor  Stroud M.D.   On: 07/17/2018 11:16    Procedures Procedures (including critical care time)  Medications Ordered in UC Medications - No data to display  Initial Impression / Assessment and Plan / UC Course  I have reviewed the triage vital signs and the nursing notes.  Pertinent labs & imaging results that were available during my care of the patient were reviewed by me and considered in my medical decision making (see chart for details).   Viewed the x-ray and her laboratory findings.  Told her with the multi focal pneumonia along with her high white count and the hyponatremia gross hematuria, the patient needs to be further evaluated and treated at a higher level facility.  .  [facility].  I spoke with Orpah Clintonollin, Triage nurse, to advise her of the patient's condition and arrival.  She will go by vehicle.   Final Clinical Impressions(s) / UC Diagnoses   Final diagnoses:  Multifocal pneumonia  Hypokalemia  Gross hematuria   Discharge Instructions   None    ED Prescriptions  None     Controlled Substance Prescriptions [place] Controlled Substance Registry consulted? Not Applicable   Lutricia Feil, PA-C 07/17/18 1139

## 2018-07-17 NOTE — ED Triage Notes (Signed)
Pt sent from the [facility] urgent care with c/o increased SOB, fever, chills over the past 3-4 days. Pt had labs and xray performed at the urgent care.

## 2018-07-17 NOTE — Progress Notes (Signed)
Troponin level came back at 0.10 again. I paged the doctor on call and Dr Anne Hahn returned my page.  He said to continue to monitor the patient and we will see what the next lab draw shows. Patient not having any pain, or trouble breathing. Tele monitor placed on patient.

## 2018-07-17 NOTE — ED Notes (Signed)
ED TO INPATIENT HANDOFF REPORT  ED Nurse Name and Phone #: Kimberly Rollins [phone]  Name/Age/Gender Kimberly Rollins 48 y.o. female Room/Bed: ED32A/ED32A  Code Status   Code Status: Full Code  Home/SNF/Other Home Patient oriented to: self, place, time, situation  Is this baseline? Yes   Triage Complete: Triage complete   Chief Complaint pneumonia  Triage Note Pt sent [facility] with c/o increased SOB, fever, chills over the past 3-4 days. Pt had labs and xray performed at the urgent care.   Allergies Allergies  Allergen Reactions  . Latex Itching and Rash    Level of Care/Admitting Diagnosis ED Disposition    ED Disposition Condition Comment   Admit  Hospital Area: [facility] [[id number]]  Level of Care: Med-Surg [16]  Diagnosis: Pneumonia [227785]  Admitting Physician: Milagros LollSUDINI, SRIKAR [[id number]]  Attending Physician: Milagros LollSUDINI, SRIKAR [[id number]]  Estimated length of stay: past midnight tomorrow  Certification:: I certify this patient will need inpatient services for at least 2 midnights  PT Class (Do Not Modify): Inpatient [101]  PT Acc Code (Do Not Modify): Private [1]       Medical/Surgery History Past Medical History:  Diagnosis Date  . Asthma    Seasonal  . Hyperlipidemia   . Hypertension    Past Surgical History:  Procedure Laterality Date  . BREAST REDUCTION SURGERY    . CESAREAN SECTION  1998     IV Location/Drains/Wounds Patient Lines/Drains/Airways Status   Active Line/Drains/Airways    Name:   Placement date:   Placement time:   Site:   Days:   Peripheral IV 07/17/18 Left Forearm   07/17/18    1514    Forearm   less than 1          Intake/Output Last 24 hours  Intake/Output Summary (Last 24 hours) at 07/17/2018 1736 Last data filed at 07/17/2018 1651 Gross per 24 hour  Intake 200 ml  Output -  Net 200 ml    Labs/Imaging Results for orders placed or performed during the hospital encounter of 07/17/18 (from the  past 48 hour(s))  Lactic acid, plasma     Status: None   Collection Time: 07/17/18  3:44 PM  Result Value Ref Range   Lactic Acid, Venous 1.9 0.5 - 1.9 mmol/L    Comment: Performed at [facility], [address] [place], [place] [address]  Influenza panel by PCR (type A & B)     Status: Abnormal   Collection Time: 07/17/18  3:44 PM  Result Value Ref Range   Influenza A By PCR POSITIVE (A) NEGATIVE   Influenza B By PCR NEGATIVE NEGATIVE    Comment: (NOTE) The Xpert Xpress Flu assay is intended as an aid in the diagnosis of  influenza and should not be used as a sole basis for treatment.  This  assay is FDA approved for nasopharyngeal swab specimens only. Nasal  washings and aspirates are unacceptable for Xpert Xpress Flu testing. Performed at [facility], [address] [place], [place] [address]   Troponin I - ONCE - STAT     Status: Abnormal   Collection Time: 07/17/18  3:44 PM  Result Value Ref Range   Troponin I 0.10 (HH) <0.03 ng/mL    Comment: CRITICAL RESULT CALLED TO, READ BACK BY AND VERIFIED WITH Kimberly Rollins 07/17/18 1629 KLW Performed at [facility], [address] [place], [place] [address]    Dg Chest 2 View  Result Date: 07/17/2018  CLINICAL DATA:  Shortness of breath.  Asthma. EXAM: CHEST - 2 VIEW COMPARISON:  None. FINDINGS: Heart size normal. There are multifocal bilateral patchy airspace densities identified left greater than right. Diffuse bronchial wall thickening is noted. No pleural effusion or edema. The visualized osseous structures are unremarkable. IMPRESSION: 1. Bilateral multifocal patchy airspace densities are noted, left greater than right. Findings worrisome for multifocal infection. Followup PA and lateral chest X-ray is recommended in 3-4 weeks following trial of antibiotic therapy to ensure resolution and exclude underlying malignancy. 2. Diffuse bronchial wall thickening identified which is compatible with the history of  reactive airways disease and/or bronchitis. Electronically Signed   By: Signa Kell M.D.   On: 07/17/2018 11:16    Pending Labs Unresulted Labs (From admission, onward)    Start     Ordered   07/24/18 0500  Creatinine, serum  (enoxaparin (LOVENOX)    CrCl >/= 30 ml/min)  Weekly,   STAT    Comments:  while on enoxaparin therapy    07/17/18 1620   07/18/18 0500  Basic metabolic panel  Tomorrow morning,   STAT     07/17/18 1620   07/18/18 0500  CBC  Tomorrow morning,   STAT     07/17/18 1620   07/17/18 2000  Troponin I - Now Then Q6H  Now then every 6 hours,   STAT     07/17/18 1717   07/17/18 1620  HIV antibody (Routine Testing)  Add-on,   AD     07/17/18 1620   07/17/18 1615  Culture, sputum-assessment  Once,   STAT    Question:  Patient immune status  Answer:  Normal   07/17/18 1615   07/17/18 1615  Gram stain  Once,   STAT    Question:  Patient immune status  Answer:  Normal   07/17/18 1615   07/17/18 1615  Strep pneumoniae urinary antigen  Once,   STAT     07/17/18 1615   07/17/18 1511  Lactic acid, plasma  STAT Now then every 3 hours,   STAT     07/17/18 1511   07/17/18 1511  Blood Culture (routine x 2)  BLOOD CULTURE X 2,   STAT     07/17/18 1511          Vitals/Pain Today's Vitals   07/17/18 1257 07/17/18 1701  BP: (!) 139/92   Pulse: (!) 131   Resp: 20   Temp: 99.9 F (37.7 C)   TempSrc: Oral   SpO2: 95%   Weight: 72.6 kg   Height: 5\' 3"  (1.6 m)   PainSc:  6     Isolation Precautions Droplet precaution  Medications Medications  sodium chloride 0.9 % bolus 500 mL (has no administration in time range)  cefTRIAXone (ROCEPHIN) 2 g in sodium chloride 0.9 % 100 mL IVPB (2 g Intravenous New Bag/Given 07/17/18 1638)  azithromycin (ZITHROMAX) 500 mg in sodium chloride 0.9 % 250 mL IVPB (0 mg Intravenous Stopped 07/17/18 1651)  predniSONE (DELTASONE) tablet 50 mg (has no administration in time range)  enoxaparin (LOVENOX) injection 40 mg (has no administration in  time range)  acetaminophen (TYLENOL) tablet 650 mg (has no administration in time range)    Or  acetaminophen (TYLENOL) suppository 650 mg (has no administration in time range)  polyethylene glycol (MIRALAX / GLYCOLAX) packet 17 g (has no administration in time range)  ondansetron (ZOFRAN) tablet 4 mg (has no administration in time range)    Or  ondansetron (ZOFRAN)  injection 4 mg (has no administration in time range)  albuterol (PROVENTIL) (2.5 MG/3ML) 0.083% nebulizer solution 2.5 mg (has no administration in time range)  ipratropium-albuterol (DUONEB) 0.5-2.5 (3) MG/3ML nebulizer solution 3 mL (has no administration in time range)  potassium chloride SA (K-DUR,KLOR-CON) CR tablet 40 mEq (has no administration in time range)  ipratropium-albuterol (DUONEB) 0.5-2.5 (3) MG/3ML nebulizer solution 3 mL (3 mLs Nebulization Given 07/17/18 1602)  methylPREDNISolone sodium succinate (SOLU-MEDROL) 125 mg/2 mL injection 80 mg (80 mg Intravenous Given 07/17/18 1636)  sodium chloride 0.9 % bolus 1,000 mL (1,000 mLs Intravenous New Bag/Given 07/17/18 1548)  acetaminophen (TYLENOL) tablet 1,000 mg (1,000 mg Oral Given 07/17/18 1648)    Mobility walks Low fall risk   Focused Assessments Cardiac Assessment Handoff:  Cardiac Rhythm: Normal sinus rhythm Lab Results  Component Value Date   TROPONINI 0.10 (HH) 07/17/2018   No results found for: DDIMER Does the Patient currently have chest pain? No     Recommendations: See Admitting Provider Note  Report given to:   Additional Notes:

## 2018-07-17 NOTE — ED Notes (Signed)
AAOx3.  Skin warm and dry.  NAD 

## 2018-07-17 NOTE — ED Triage Notes (Signed)
Patient in today c/o cough, sob, body aches, chills and headache x 3 days. Patient did a phone md visit and was prescribed Prednisone, but that hasn't helped.

## 2018-07-17 NOTE — ED Provider Notes (Signed)
[facility] Emergency Department Provider Note    First MD Initiated Contact with Patient 07/17/18 1510     (approximate)  I have reviewed the triage vital signs and the nursing notes.   HISTORY  Chief Complaint Shortness of Breath    HPI Kimberly Rollins is a 48 y.o. female below listed past medical history presents the ER for persistent shortness of breath cough congestion and chills that started over the weekend.  Does have a history of asthma and was treated for asthma exacerbation with prednisone but is having increasing exertional dyspnea.  She went to urgent care clinic this morning was found to be febrile tachycardic.  Had a leukocytosis to over 15 and had chest x-ray showing evidence of multifocal pneumonia.  She was never hypoxic at rest but based on her underlying asthma and findings consistent with sepsis was sent to the ER for further evaluation.  On arrival to the ER the patient is short winded only able to speak in short phrases.  Not hypoxic but use accessory muscles.  She not been on any recent antibiotics.  Has not taken anything for cough or pain.    Past Medical History:  Diagnosis Date  . Asthma    Seasonal  . Hyperlipidemia   . Hypertension    Family History  Problem Relation Age of Onset  . Hypertension Father   . Diabetes Father   . COPD Mother   . Breast cancer Neg Hx    Past Surgical History:  Procedure Laterality Date  . BREAST REDUCTION SURGERY    . CESAREAN SECTION  1998   Patient Active Problem List   Diagnosis Date Noted  . Trigger finger of all digits of both hands 05/16/2018  . Tibialis posterior tendinitis, right 03/01/2018  . Lateral epicondylitis of right elbow 03/09/2015  . Tobacco abuse 08/04/2014  . Asthma in adult 04/05/2011  . HTN (hypertension) 04/05/2011  . Hyperlipidemia 04/05/2011  . Pain in joint, lower leg 02/05/2009  . PREPATELLAR BURSITIS 02/05/2009  . OTHER ANKLE SPRAIN AND STRAIN  02/05/2009      Prior to Admission medications   Medication Sig Start Date End Date Taking? Authorizing Provider  amLODipine (NORVASC) 5 MG tablet TAKE 1 TABLET(5 MG) BY MOUTH DAILY 07/17/18   Zola Button, Grayling Congress, DO  aspirin 81 MG chewable tablet Chew 81 mg by mouth daily.    [provider]  atorvastatin (LIPITOR) 20 MG tablet TAKE 1 TABLET(20 MG) BY MOUTH DAILY 11/10/17   Zola Button, Grayling Congress, DO  ciprofloxacin (CIPRO) 250 MG tablet Take 1 tablet (250 mg total) by mouth 2 (two) times daily. Patient not taking: Reported on 05/15/2018 02/02/18   Seabron Spates R, DO  Diclofenac Sodium 2 % SOLN Place 2 g onto the skin 2 (two) times daily. 03/01/18   Judi Saa, DO  fluconazole (DIFLUCAN) 150 MG tablet Take 1 tablet by mouth once. Repeat in one week if needed. 02/02/18   Seabron Spates R, DO  lisinopril (PRINIVIL,ZESTRIL) 20 MG tablet TAKE 1 TABLET(20 MG) BY MOUTH DAILY 07/17/18   Zola Button, Grayling Congress, DO  meloxicam (MOBIC) 15 MG tablet TAKE 1 TABLET BY MOUTH EVERY DAY AS NEEDED 02/26/18   Donato Schultz, DO  Multiple Vitamin (MULTIVITAMIN) tablet Take 1 tablet by mouth daily.    [provider]  predniSONE (DELTASONE) 20 MG tablet Take 1 tablet by mouth 3 (three) times daily. 07/14/18   [provider]  PROAIR HFA 108 (90 Base) MCG/ACT inhaler INHALE 2 PUFFS INTO THE LUNGS EVERY 6 HOURS AS NEEDED FOR WHEEZING OR SHORTNESS OF BREATH 03/16/18   Zola ButtonLowne Chase, Grayling CongressYvonne R, DO  varenicline (CHANTIX STARTING MONTH PAK) 0.5 MG X 11 & 1 MG X 42 tablet Take one 0.5 mg tablet by mouth once daily for 3 days, then increase to one 0.5 mg tablet twice daily for 4 days, then increase to one 1 mg tablet twice daily. 10/17/16   Donato SchultzLowne Chase, Yvonne R, DO  varenicline (CHANTIX STARTING MONTH PAK) 0.5 MG X 11 & 1 MG X 42 tablet Take one 0.5 mg tablet by mouth once daily for 3 days, then increase to one 0.5 mg tablet twice daily for 4 days, then increase to one 1 mg tablet twice  daily. 01/30/18   Donato SchultzLowne Chase, Yvonne R, DO    Allergies Latex    Social History Social History   Tobacco Use  . Smoking status: Former Smoker    Packs/day: 1.00    Years: 20.00    Pack years: 20.00    Types: Cigarettes    Last attempt to quit: 07/10/2018    Years since quitting: 0.0  . Smokeless tobacco: Never Used  . Tobacco comment: program at work--pt will try-- trying to cut down  Substance Use Topics  . Alcohol use: Yes    Alcohol/week: 0.0 standard drinks    Comment: Socially  . Drug use: No    Review of Systems Patient denies headaches, rhinorrhea, blurry vision, numbness, shortness of breath, chest pain, edema, cough, abdominal pain, nausea, vomiting, diarrhea, dysuria, fevers, rashes or hallucinations unless otherwise stated above in HPI. ____________________________________________   PHYSICAL EXAM:  VITAL SIGNS: Vitals:   07/17/18 1257  BP: (!) 139/92  Pulse: (!) 131  Resp: 20  Temp: 99.9 F (37.7 C)  SpO2: 95%    Constitutional: Alert and oriented.  Tachypneic to the low 30s but smiling. Eyes: Conjunctivae are normal.  Head: Atraumatic. Nose: No congestion/rhinnorhea. Mouth/Throat: Mucous membranes are moist.   Neck: No stridor. Painless ROM.  Cardiovascular: tachycardic rate, regular rhythm. Grossly normal heart sounds.  Good peripheral circulation. Respiratory: Tachypnea with use of accessory muscles with scattered wheeze and rhonchi  Gastrointestinal: Soft and nontender. No distention. No abdominal bruits. No CVA tenderness. Genitourinary:  Musculoskeletal: No lower extremity tenderness nor edema.  No joint effusions. Neurologic:  Normal speech and language. No gross focal neurologic deficits are appreciated. No facial droop Skin:  Skin is warm, dry and intact. No rash noted. Psychiatric: Mood and affect are normal. Speech and behavior are normal.  ____________________________________________   LABS (all labs ordered are listed, but only  abnormal results are displayed)  Results for orders placed or performed during the hospital encounter of 07/17/18 (from the past 24 hour(s))  CBC with Differential     Status: Abnormal   Collection Time: 07/17/18 10:42 AM  Result Value Ref Range   WBC 19.1 (H) 4.0 - 10.5 K/uL   RBC 5.14 (H) 3.87 - 5.11 MIL/uL   Hemoglobin 16.4 (H) 12.0 - 15.0 g/dL   HCT 16.145.3 09.636.0 - 04.546.0 %   MCV 88.1 80.0 - 100.0 fL   MCH 31.9 26.0 - 34.0 pg   MCHC 36.2 (H) 30.0 - 36.0 g/dL   RDW 40.913.5 81.111.5 - 91.415.5 %   Platelets 199 150 - 400 K/uL   nRBC 0.0 0.0 - 0.2 %   Neutrophils Relative % 85 %   Neutro Abs 16.5 (H)  1.7 - 7.7 K/uL   Lymphocytes Relative 9 %   Lymphs Abs 1.7 0.7 - 4.0 K/uL   Monocytes Relative 4 %   Monocytes Absolute 0.7 0.1 - 1.0 K/uL   Eosinophils Relative 1 %   Eosinophils Absolute 0.2 0.0 - 0.5 K/uL   Basophils Relative 0 %   Basophils Absolute 0.0 0.0 - 0.1 K/uL   Immature Granulocytes 1 %   Abs Immature Granulocytes 0.11 (H) 0.00 - 0.07 K/uL  Comprehensive metabolic panel     Status: Abnormal   Collection Time: 07/17/18 10:42 AM  Result Value Ref Range   Sodium 138 135 - 145 mmol/L   Potassium 3.0 (L) 3.5 - 5.1 mmol/L   Chloride 104 98 - 111 mmol/L   CO2 22 22 - 32 mmol/L   Glucose, Bld 186 (H) 70 - 99 mg/dL   BUN 8 6 - 20 mg/dL   Creatinine, Ser 8.18 0.44 - 1.00 mg/dL   Calcium 9.3 8.9 - 29.9 mg/dL   Total Protein 7.7 6.5 - 8.1 g/dL   Albumin 3.7 3.5 - 5.0 g/dL   AST 26 15 - 41 U/L   ALT 29 0 - 44 U/L   Alkaline Phosphatase 96 38 - 126 U/L   Total Bilirubin 0.8 0.3 - 1.2 mg/dL   GFR calc non Af Amer >60 >60 mL/min   GFR calc Af Amer >60 >60 mL/min   Anion gap 12 5 - 15   ____________________________________________  EKG My review and personal interpretation at Time: 13:12   Indication: sob  Rate: 120  Rhythm: sinus Axis: normal Other: nonspecific st depression, likely rate dependent in setting of sepsis ____________________________________________  RADIOLOGY  I  personally reviewed all radiographic images ordered to evaluate for the above acute complaints and reviewed radiology reports and findings.  These findings were personally discussed with the patient.  Please see medical record for radiology report.  ____________________________________________   PROCEDURES  Procedure(s) performed:  Procedures    Critical Care performed: no ____________________________________________   INITIAL IMPRESSION / ASSESSMENT AND PLAN / ED COURSE  Pertinent labs & imaging results that were available during my care of the patient were reviewed by me and considered in my medical decision making (see chart for details).   DDX: asthma, pna, chf, pe, mass  Avalea Mazor is a 47 y.o. who presents to the ED with cough congestion fever tachycardia and exam findings suggestive of pneumonia.  Chest x-ray from outpatient clinic shows multifocal pneumonia.  Given her asthma mild respiratory distress and presenting symptoms consistent with sepsis patient will require admission to the hospital for IV antibiotics, steroids, nebulizer treatment and hemodynamic monitoring.  Give IV fluids as well as nebulizers, steroids and IV antibiotics.      As part of my medical decision making, I reviewed the following data within the electronic MEDICAL RECORD NUMBER Nursing notes reviewed and incorporated, Labs reviewed, notes from prior ED visits and [place] Controlled Substance Database   ____________________________________________   FINAL CLINICAL IMPRESSION(S) / ED DIAGNOSES  Final diagnoses:  Multifocal pneumonia  Moderate persistent asthma with acute exacerbation      NEW MEDICATIONS STARTED DURING THIS VISIT:  New Prescriptions   No medications on file     Note:  This document was prepared using Dragon voice recognition software and may include unintentional dictation errors.    Willy Eddy, MD 07/17/18 [phone]

## 2018-07-18 LAB — CBC
HCT: 39.2 % (ref 36.0–46.0)
Hemoglobin: 14 g/dL (ref 12.0–15.0)
MCH: 31.2 pg (ref 26.0–34.0)
MCHC: 35.7 g/dL (ref 30.0–36.0)
MCV: 87.3 fL (ref 80.0–100.0)
Platelets: 188 10*3/uL (ref 150–400)
RBC: 4.49 MIL/uL (ref 3.87–5.11)
RDW: 13.3 % (ref 11.5–15.5)
WBC: 14.3 10*3/uL — ABNORMAL HIGH (ref 4.0–10.5)
nRBC: 0 % (ref 0.0–0.2)

## 2018-07-18 LAB — BASIC METABOLIC PANEL
Anion gap: 6 (ref 5–15)
BUN: 9 mg/dL (ref 6–20)
CO2: 21 mmol/L — ABNORMAL LOW (ref 22–32)
CREATININE: 0.55 mg/dL (ref 0.44–1.00)
Calcium: 8.7 mg/dL — ABNORMAL LOW (ref 8.9–10.3)
Chloride: 110 mmol/L (ref 98–111)
GFR calc Af Amer: >60 mL/min (ref >60)
GFR calc non Af Amer: >60 mL/min (ref >60)
Glucose, Bld: 237 mg/dL — ABNORMAL HIGH (ref 70–99)
Potassium: 3.8 mmol/L (ref 3.5–5.1)
Sodium: 137 mmol/L (ref 135–145)

## 2018-07-18 LAB — EXPECTORATED SPUTUM ASSESSMENT W GRAM STAIN, RFLX TO RESP C

## 2018-07-18 LAB — STREP PNEUMONIAE URINARY ANTIGEN: Strep Pneumo Urinary Antigen: NEGATIVE

## 2018-07-18 LAB — TROPONIN I: Troponin I: 0.07 ng/mL (ref <0.03)

## 2018-07-18 MED ORDER — IPRATROPIUM-ALBUTEROL 0.5-2.5 (3) MG/3ML IN SOLN
3.0000 mL | RESPIRATORY_TRACT | Status: DC
  Administered 2018-07-18 – 2018-07-22 (×23): 3 mL via RESPIRATORY_TRACT
  Filled 2018-07-18 (×23): qty 3

## 2018-07-18 NOTE — Progress Notes (Signed)
   07/18/18 1100  Clinical Encounter Type  Visited With Patient  Visit Type Initial;Spiritual support  Referral From Patient;Physician  Consult/Referral To Chaplain  Spiritual Encounters  Spiritual Needs Prayer;Emotional;Grief support  Stress Factors  Patient Stress Factors Family relationships;Loss;Other (Comment)  Advance Directives (For Healthcare)  Does Patient Have a Medical Advance Directive? No  Would patient like information on creating a medical advance directive? Yes (Inpatient - patient requests chaplain consult to create a medical advance directive)  Mental Health Advance Directives  Does Patient Have a Mental Health Advance Directive? No  Would patient like information on creating a mental health advance directive? No - Patient declined

## 2018-07-18 NOTE — Progress Notes (Signed)
[facility] Physicians - [place] at [facility]   PATIENT NAME: Kimberly Rollins    MR#:  [id number]  DATE OF BIRTH:  1970/07/15  SUBJECTIVE: Admitted for influenza, pneumonia, feeling better than yesterday, but still has some cough unable to get the phlegm out, requesting more frequent nebulizers.  Not hypoxic, afebrile.  CHIEF COMPLAINT:   Chief Complaint  Patient presents with  . Shortness of Breath    REVIEW OF SYSTEMS:   ROS CONSTITUTIONAL: No fever, fatigue or weakness.  EYES: No blurred or double vision.  EARS, NOSE, AND THROAT: No tinnitus or ear pain.  RESPIRATORY: Cough, mild shortness of breath.  CARDIOVASCULAR: No chest pain, orthopnea, edema.  GASTROINTESTINAL: No nausea, vomiting, diarrhea or abdominal pain.  GENITOURINARY: No dysuria, hematuria.  ENDOCRINE: No polyuria, nocturia,  HEMATOLOGY: No anemia, easy bruising or bleeding SKIN: No rash or lesion. MUSCULOSKELETAL: No joint pain or arthritis.   NEUROLOGIC: No tingling, numbness, weakness.  PSYCHIATRY: No anxiety or depression.   DRUG ALLERGIES:   Allergies  Allergen Reactions  . Latex Itching and Rash    VITALS:  Blood pressure (!) 128/98, pulse 97, temperature 99.7 F (37.6 C), resp. rate 20, height 5\' 3"  (1.6 m), weight 76.7 kg, last menstrual period 07/16/2018, SpO2 94 %.  PHYSICAL EXAMINATION:  GENERAL:  48 y.o.-year-old patient lying in the bed with no acute distress.  EYES: Pupils equal, round, reactive to light and accommodation. No scleral icterus. Extraocular muscles intact.  HEENT: Head atraumatic, normocephalic. Oropharynx and nasopharynx clear.  NECK:  Supple, no jugular venous distention. No thyroid enlargement, no tenderness.  LUNGS: Diminished breath sounds bilaterally.Marland Kitchen  CARDIOVASCULAR: S1, S2 normal. No murmurs, rubs, or gallops.  ABDOMEN: Soft, nontender, nondistended. Bowel sounds present. No organomegaly or mass.  EXTREMITIES: No pedal edema, cyanosis, or  clubbing.  NEUROLOGIC: Cranial nerves II through XII are intact. Muscle strength 5/5 in all extremities. Sensation intact. Gait not checked.  PSYCHIATRIC: The patient is alert and oriented x 3.  SKIN: No obvious rash, lesion, or ulcer.    LABORATORY PANEL:   CBC Recent Labs  Lab 07/18/18 0148  WBC 14.3*  HGB 14.0  HCT 39.2  PLT 188   ------------------------------------------------------------------------------------------------------------------  Chemistries  Recent Labs  Lab 07/17/18 1042 07/18/18 0148  NA 138 137  K 3.0* 3.8  CL 104 110  CO2 22 21*  GLUCOSE 186* 237*  BUN 8 9  CREATININE 0.50 0.55  CALCIUM 9.3 8.7*  AST 26  --   ALT 29  --   ALKPHOS 96  --   BILITOT 0.8  --    ------------------------------------------------------------------------------------------------------------------  Cardiac Enzymes Recent Labs  Lab 07/18/18 0148  TROPONINI 0.07*   ------------------------------------------------------------------------------------------------------------------  RADIOLOGY:  Dg Chest 2 View  Result Date: 07/17/2018 CLINICAL DATA:  Shortness of breath.  Asthma. EXAM: CHEST - 2 VIEW COMPARISON:  None. FINDINGS: Heart size normal. There are multifocal bilateral patchy airspace densities identified left greater than right. Diffuse bronchial wall thickening is noted. No pleural effusion or edema. The visualized osseous structures are unremarkable. IMPRESSION: 1. Bilateral multifocal patchy airspace densities are noted, left greater than right. Findings worrisome for multifocal infection. Followup PA and lateral chest X-ray is recommended in 3-4 weeks following trial of antibiotic therapy to ensure resolution and exclude underlying malignancy. 2. Diffuse bronchial wall thickening identified which is compatible with the history of reactive airways disease and/or bronchitis. Electronically Signed   By: Signa Kell M.D.   On: 07/17/2018 11:16    EKG:  Orders  placed or performed during the hospital encounter of 07/17/18  . ED EKG  . ED EKG  . EKG 12-Lead  . EKG 12-Lead    ASSESSMENT AND PLAN:   48 year old female with history of hypertension, asthma comes in because of shortness of breath, cough and found to have bilateral pneumonia, influenza.  Admitted for the same. 1.  Acute bilateral pneumonia, influenza: Continue IV antibiotics, Tamiflu, continue bronchodilators, change to frequency, follow sputum cultures,  #2 type influenza: Continue Tamiflu.  Likely discharge home tomorrow. All the records are reviewed and case discussed with Care Management/Social Workerr. Management plans discussed with the patient, family and they are in agreement.  CODE STATUS: Full code  TOTAL TIME TAKING CARE OF THIS PATIENT: 35minutes.   POSSIBLE D/C IN 1DAY DEPENDING ON CLINICAL CONDITION.   Katha HammingSnehalatha Shahil Speegle M.D on 07/18/2018 at 11:25 AM  Between 7am to 6pm - Pager - [phone]  After 6pm go to [email or web address] - password [email or web address] [email or web address]  [facility]  Office  [phone]  CC: Primary care physician; Donato SchultzLowne Chase, Yvonne R, DO   Note: This dictation was prepared with Dragon dictation along with smaller phrase technology. Any transcriptional errors that result from this process are unintentional.

## 2018-07-18 NOTE — Progress Notes (Signed)
Patient given and instructed on flutter value.

## 2018-07-19 LAB — CBC
HCT: 39.8 % (ref 36.0–46.0)
Hemoglobin: 14 g/dL (ref 12.0–15.0)
MCH: 31 pg (ref 26.0–34.0)
MCHC: 35.2 g/dL (ref 30.0–36.0)
MCV: 88.1 fL (ref 80.0–100.0)
NRBC: 0 % (ref 0.0–0.2)
PLATELETS: 200 10*3/uL (ref 150–400)
RBC: 4.52 MIL/uL (ref 3.87–5.11)
RDW: 13.2 % (ref 11.5–15.5)
WBC: 12.9 10*3/uL — ABNORMAL HIGH (ref 4.0–10.5)

## 2018-07-19 LAB — BASIC METABOLIC PANEL
Anion gap: 8 (ref 5–15)
BUN: 8 mg/dL (ref 6–20)
CALCIUM: 8.8 mg/dL — AB (ref 8.9–10.3)
CO2: 23 mmol/L (ref 22–32)
Chloride: 107 mmol/L (ref 98–111)
Creatinine, Ser: 0.54 mg/dL (ref 0.44–1.00)
GFR calc Af Amer: >60 mL/min (ref >60)
Glucose, Bld: 126 mg/dL — ABNORMAL HIGH (ref 70–99)
Potassium: 3.2 mmol/L — ABNORMAL LOW (ref 3.5–5.1)
Sodium: 138 mmol/L (ref 135–145)

## 2018-07-19 LAB — HIV ANTIBODY (ROUTINE TESTING W REFLEX): HIV Screen 4th Generation wRfx: NONREACTIVE

## 2018-07-19 MED ORDER — SODIUM CHLORIDE 0.9 % IV SOLN: INTRAVENOUS | Status: DC

## 2018-07-19 MED ORDER — SODIUM CHLORIDE 0.9 % IV SOLN
INTRAVENOUS | Status: DC
  Administered 2018-07-19 – 2018-07-20 (×2): via INTRAVENOUS

## 2018-07-19 MED ORDER — POTASSIUM CHLORIDE CRYS ER 20 MEQ PO TBCR
40.0000 meq | EXTENDED_RELEASE_TABLET | Freq: Once | ORAL | Status: AC
  Administered 2018-07-19: 40 meq via ORAL
  Filled 2018-07-19: qty 2

## 2018-07-19 MED ORDER — DEXTROMETHORPHAN POLISTIREX ER 30 MG/5ML PO SUER
30.0000 mg | Freq: Two times a day (BID) | ORAL | Status: DC
  Administered 2018-07-19 – 2018-07-21 (×4): 30 mg via ORAL
  Filled 2018-07-19 (×8): qty 5

## 2018-07-19 MED ORDER — GUAIFENESIN ER 600 MG PO TB12
600.0000 mg | ORAL_TABLET | Freq: Two times a day (BID) | ORAL | Status: DC
  Administered 2018-07-19 – 2018-07-20 (×3): 600 mg via ORAL
  Filled 2018-07-19: qty 1

## 2018-07-19 MED ORDER — POTASSIUM CHLORIDE CRYS ER 20 MEQ PO TBCR
40.0000 meq | EXTENDED_RELEASE_TABLET | ORAL | Status: AC
  Administered 2018-07-19 (×2): 40 meq via ORAL
  Filled 2018-07-19 (×2): qty 2

## 2018-07-19 MED ORDER — AZITHROMYCIN 250 MG PO TABS
500.0000 mg | ORAL_TABLET | Freq: Every day | ORAL | Status: DC
  Administered 2018-07-19 – 2018-07-22 (×4): 500 mg via ORAL
  Filled 2018-07-19 (×5): qty 2

## 2018-07-19 MED ORDER — DM-GUAIFENESIN ER 30-600 MG PO TB12: 1.0000 | ORAL_TABLET | Freq: Two times a day (BID) | ORAL | Status: DC

## 2018-07-19 MED ORDER — SODIUM CHLORIDE 0.9 % IV BOLUS
1000.0000 mL | Freq: Once | INTRAVENOUS | Status: AC
  Administered 2018-07-19: 1000 mL via INTRAVENOUS

## 2018-07-19 MED ORDER — GUAIFENESIN ER 600 MG PO TB12
600.0000 mg | ORAL_TABLET | Freq: Two times a day (BID) | ORAL | Status: DC
  Administered 2018-07-19 – 2018-07-22 (×5): 600 mg via ORAL
  Filled 2018-07-19 (×7): qty 1

## 2018-07-19 NOTE — Progress Notes (Signed)
[facility] Phys[facility] - [place] at [facility]   PATIENT NAME: Kimberly Rollins    MR#:  [id number]  DATE OF BIRTH:  [birth year]  SUBJECTIVE: Admitted for influenza, pneumonia, she says she is feeling worse today, has tachycardia, low-grade temperature.  Will give IV fluids, watch for 1 more day.  Because she is not feeling well as per her.  CHIEF COMPLAINT:   Chief Complaint  Patient presents with  . Shortness of Breath    REVIEW OF SYSTEMS:   ROS CONSTITUTIONAL: Low-grade temperature, generalized weakness.  EYES: No blurred or double vision.  EARS, NOSE, AND THROAT: No tinnitus or ear pain.  RESPIRATORY: Cough, mild shortness of breath.  CARDIOVASCULAR: No chest pain, orthopnea, edema.  GASTROINTESTINAL: No nausea, vomiting, diarrhea or abdominal pain.  GENITOURINARY: No dysuria, hematuria.  ENDOCRINE: No polyuria, nocturia,  HEMATOLOGY: No anemia, easy bruising or bleeding SKIN: No rash or lesion. MUSCULOSKELETAL: No joint pain or arthritis.   NEUROLOGIC: No tingling, numbness, weakness.  PSYCHIATRY: No anxiety or depression.   DRUG ALLERGIES:   Allergies  Allergen Reactions  . Latex Itching and Rash    VITALS:  Blood pressure (!) 143/99, pulse (!) 110, temperature 98.3 F (36.8 C), temperature source Oral, resp. rate 19, height 5\' 3"  (1.6 m), weight 76.7 kg, last menstrual period 07/16/2018, SpO2 95 %.  PHYSICAL EXAMINATION:  GENERAL:  48 y.o.-year-old patient lying in the bed with no acute distress.  EYES: Pupils equal, round, reactive to light and accommodation. No scleral icterus. Extraocular muscles intact.  HEENT: Head atraumatic, normocephalic. Oropharynx and nasopharynx clear.  NECK:  Supple, no jugular venous distention. No thyroid enlargement, no tenderness.  LUNGS: Diminished breath sounds bilaterally.Marland Kitchen  CARDIOVASCULAR: S1, S2 normal. No murmurs, rubs, or gallops.  ABDOMEN: Soft, nontender, nondistended. Bowel sounds present. No  organomegaly or mass.  EXTREMITIES: No pedal edema, cyanosis, or clubbing.  NEUROLOGIC: Cranial nerves II through XII are intact. Muscle strength 5/5 in all extremities. Sensation intact. Gait not checked.  PSYCHIATRIC: The patient is alert and oriented x 3.  SKIN: No obvious rash, lesion, or ulcer.    LABORATORY PANEL:   CBC Recent Labs  Lab 07/19/18 0851  WBC 12.9*  HGB 14.0  HCT 39.8  PLT 200   ------------------------------------------------------------------------------------------------------------------  Chemistries  Recent Labs  Lab 07/17/18 1042  07/19/18 0851  NA 138   < > 138  K 3.0*   < > 3.2*  CL 104   < > 107  CO2 22   < > 23  GLUCOSE 186*   < > 126*  BUN 8   < > 8  CREATININE 0.50   < > 0.54  CALCIUM 9.3   < > 8.8*  AST 26  --   --   ALT 29  --   --   ALKPHOS 96  --   --   BILITOT 0.8  --   --    < > = values in this interval not displayed.   ------------------------------------------------------------------------------------------------------------------  Cardiac Enzymes Recent Labs  Lab 07/18/18 0148  TROPONINI 0.07*   ------------------------------------------------------------------------------------------------------------------  RADIOLOGY:  No results found.  EKG:   Orders placed or performed during the hospital encounter of 07/17/18  . ED EKG  . ED EKG  . EKG 12-Lead  . EKG 12-Lead  . EKG    ASSESSMENT AND PLAN:   48 year old female with history of hypertension, asthma comes in because of shortness of breath, cough and found to have bilateral pneumonia, influenza.  Admitted for the same. 1.  Acute bilateral pneumonia, influenza: Continue IV antibiotics, Tamiflu, continue bronchodilators, and is feeling worse today with tachycardia, low-grade temperature: Continue IV fluids, continue antibiotics, watch the trend closely. #2. type Ainfluenza: Continue Tamiflu.  Likely discharge home tomorrow. All the records are reviewed and  case discussed with Care Management/Social Workerr. Management plans discussed with the patient, family and they are in agreement.  CODE STATUS: Full code  TOTAL TIME TAKING CARE OF THIS PATIENT: 35minutes.   POSSIBLE D/C IN 1DAY DEPENDING ON CLINICAL CONDITION. Unable to discharge today because of tachycardia:, Low-grade temperature, hypokalemia.  Katha HammingSnehalatha Randell Teare M.D on 07/19/2018 at 2:16 PM  Between 7am to 6pm - Pager - [phone]  After 6pm go to [email or web address] - password [email or web address]  [facility]  Office  [phone]  CC: Primary care physician; Donato SchultzLowne Chase, Yvonne R, DO   Note: This dictation was prepared with Dragon dictation along with smaller phrase technology. Any transcriptional errors that result from this process are unintentional.

## 2018-07-19 NOTE — Progress Notes (Signed)
Patient with sustained HR in the 120'2 to 130's. Patient is asymptomatic and currently bathing in the room. Per MD we will start IVF if sustained after exertion.

## 2018-07-20 ENCOUNTER — Observation Stay: Payer: 59

## 2018-07-20 DIAGNOSIS — R0602 Shortness of breath: Secondary | ICD-10-CM | POA: Diagnosis not present

## 2018-07-20 LAB — CULTURE, RESPIRATORY W GRAM STAIN: Culture: NORMAL

## 2018-07-20 MED ORDER — METHYLPREDNISOLONE SODIUM SUCC 125 MG IJ SOLR
60.0000 mg | Freq: Every day | INTRAMUSCULAR | Status: DC
  Administered 2018-07-20 – 2018-07-22 (×3): 60 mg via INTRAVENOUS
  Filled 2018-07-20 (×3): qty 2

## 2018-07-20 MED ORDER — IBUPROFEN 400 MG PO TABS
400.0000 mg | ORAL_TABLET | Freq: Four times a day (QID) | ORAL | Status: DC | PRN
  Administered 2018-07-20 – 2018-07-21 (×5): 400 mg via ORAL
  Filled 2018-07-20 (×6): qty 1

## 2018-07-20 NOTE — Progress Notes (Signed)
[facility] - [place] at [facility]   PATIENT NAME: Kimberly Rollins    MR#:  [id number]  DATE OF BIRTH:  [birth year]  SUBJECTIVE: Admitted for influenza, pneumonia, now requiring oxygen, O2 sats were 87% on room air and started on 2 L of oxygen, has low-grade fever, chills, wondering if she has UTI.  Chest x-ray this morning showed worsening pneumonia.  CHIEF COMPLAINT:   Chief Complaint  Patient presents with  . Shortness of Breath    REVIEW OF SYSTEMS:   ROS CONSTITUTIONAL: Low-grade temperature, generalized weakness.  Chills EYES: No blurred or double vision.  EARS, NOSE, AND THROAT: No tinnitus or ear pain.  RESPIRATORY: Cough, mild shortness of breath.  CARDIOVASCULAR: No chest pain, orthopnea, edema.  GASTROINTESTINAL: No nausea, vomiting, diarrhea or abdominal pain.  GENITOURINARY: No dysuria, hematuria.  ENDOCRINE: No polyuria, nocturia,  HEMATOLOGY: No anemia, easy bruising or bleeding SKIN: No rash or lesion. MUSCULOSKELETAL: No joint pain or arthritis.   NEUROLOGIC: No tingling, numbness, weakness.  PSYCHIATRY: No anxiety or depression.   DRUG ALLERGIES:   Allergies  Allergen Reactions  . Latex Itching and Rash    VITALS:  Blood pressure (!) 145/92, pulse (!) 112, temperature 98.7 F (37.1 C), temperature source Oral, resp. rate 20, height 5\' 3"  (1.6 m), weight 76.7 kg, last menstrual period 07/16/2018, SpO2 94 %.  PHYSICAL EXAMINATION:  GENERAL:  48 y.o.-year-old patient lying in the bed with no acute distress.  EYES: Pupils equal, round, reactive to light and accommodation. No scleral icterus. Extraocular muscles intact.  HEENT: Head atraumatic, normocephalic. Oropharynx and nasopharynx clear.  NECK:  Supple, no jugular venous distention. No thyroid enlargement, no tenderness.  LUNGS: Diminished breath sounds bilaterally.Marland Kitchen  CARDIOVASCULAR: S1, S2 normal. No murmurs, rubs, or gallops.  ABDOMEN: Soft, nontender, nondistended.  Bowel sounds present. No organomegaly or mass.  EXTREMITIES: No pedal edema, cyanosis, or clubbing.  NEUROLOGIC: Cranial nerves II through XII are intact. Muscle strength 5/5 in all extremities. Sensation intact. Gait not checked.  PSYCHIATRIC: The patient is alert and oriented x 3.  SKIN: No obvious rash, lesion, or ulcer.    LABORATORY PANEL:   CBC Recent Labs  Lab 07/19/18 0851  WBC 12.9*  HGB 14.0  HCT 39.8  PLT 200   ------------------------------------------------------------------------------------------------------------------  Chemistries  Recent Labs  Lab 07/17/18 1042  07/19/18 0851  NA 138   < > 138  K 3.0*   < > 3.2*  CL 104   < > 107  CO2 22   < > 23  GLUCOSE 186*   < > 126*  BUN 8   < > 8  CREATININE 0.50   < > 0.54  CALCIUM 9.3   < > 8.8*  AST 26  --   --   ALT 29  --   --   ALKPHOS 96  --   --   BILITOT 0.8  --   --    < > = values in this interval not displayed.   ------------------------------------------------------------------------------------------------------------------  Cardiac Enzymes Recent Labs  Lab 07/18/18 0148  TROPONINI 0.07*   ------------------------------------------------------------------------------------------------------------------  RADIOLOGY:  Dg Chest Port 1 View  Result Date: 07/20/2018 CLINICAL DATA:  Shortness of breath EXAM: PORTABLE CHEST 1 VIEW COMPARISON:  July 17, 2018 FINDINGS: There is patchy airspace opacity in the right upper lobe as well as in both lower lobe regions, felt to represent multifocal pneumonia, slightly increased from 3 days prior. Heart size and pulmonary vascularity are normal. No  adenopathy. No bone lesions. IMPRESSION: Progression of patchy infiltrate bilaterally, felt to represent multifocal pneumonia. No adenopathy. Heart size within normal limits. Electronically Signed   By: Bretta Bang III M.D.   On: 07/20/2018 07:38    EKG:   Orders placed or performed during the hospital  encounter of 07/17/18  . ED EKG  . ED EKG  . EKG 12-Lead  . EKG 12-Lead  . EKG    ASSESSMENT AND PLAN:   48 year old female with history of hypertension, asthma comes in because of shortness of breath, cough and found to have bilateral pneumonia, influenza.  Admitted for the same. 1.  Acute respiratory failure with hypoxia with O2 sats 87% on room air last night, secondary to worsening pneumonia, continue oxygen, continue bronchodilators, add IV steroids,.  Discontinue prednisone tablets, continue to finish Tamiflu because patient also has influenza type A.  Continue IV Rocephin, Zithromax. #2. type Ai nfluenza: Continue Tamiflu. #3 hypokalemia: Replace potassium. Slightly elevated troponins, troponins are coming down from 0.1-0.07.  Patient has no chest pain. Because of hypoxia patient need to stay inpatient, wean off oxygen, use IV steroids now, continue IV antibiotics, check UA because she has low-grade fever, chills   All the records are reviewed and case discussed with Care Management/Social Workerr. Management plans discussed with the patient, family and they are in agreement.  CODE STATUS: Full code  TOTAL TIME TAKING CARE OF THIS PATIENT: .   POSSIBLE D/C IN 1DAY DEPENDING ON CLINICAL CONDITION. Unable to discharge today because of tachycardia:, Low-grade temperature, hypokalemia.  Katha Hamming M.D on 07/20/2018 at 12:27 PM  Between 7am to 6pm - Pager - [phone]  After 6pm go to [email or web address] - password [email or web address] [facility]  [place] [facility]  Office  [phone]  CC: Primary care physician; Donato Schultz, DO   Note: This dictation was prepared with Dragon dictation along with smaller phrase technology. Any transcriptional errors that result from this process are unintentional.

## 2018-07-20 NOTE — Progress Notes (Signed)
Patient SAT 87% on Room Air, patient placed on 2L [place].

## 2018-07-21 LAB — BASIC METABOLIC PANEL
Anion gap: 8 (ref 5–15)
BUN: 11 mg/dL (ref 6–20)
CO2: 21 mmol/L — ABNORMAL LOW (ref 22–32)
Calcium: 9.6 mg/dL (ref 8.9–10.3)
Chloride: 108 mmol/L (ref 98–111)
Creatinine, Ser: 0.52 mg/dL (ref 0.44–1.00)
GFR calc Af Amer: >60 mL/min (ref >60)
GFR calc non Af Amer: >60 mL/min (ref >60)
GLUCOSE: 312 mg/dL — AB (ref 70–99)
Potassium: 4.2 mmol/L (ref 3.5–5.1)
Sodium: 137 mmol/L (ref 135–145)

## 2018-07-21 LAB — CBC
HCT: 39.4 % (ref 36.0–46.0)
Hemoglobin: 14 g/dL (ref 12.0–15.0)
MCH: 31.5 pg (ref 26.0–34.0)
MCHC: 35.5 g/dL (ref 30.0–36.0)
MCV: 88.7 fL (ref 80.0–100.0)
PLATELETS: 252 10*3/uL (ref 150–400)
RBC: 4.44 MIL/uL (ref 3.87–5.11)
RDW: 12.9 % (ref 11.5–15.5)
WBC: 18.2 10*3/uL — ABNORMAL HIGH (ref 4.0–10.5)
nRBC: 0 % (ref 0.0–0.2)

## 2018-07-21 NOTE — Progress Notes (Signed)
[facility] - [place] at [facility]   PATIENT NAME: Kimberly Rollins    MR#:  [id number]  DATE OF BIRTH:  September 30, 1970  SUBJECTIVE:  CHIEF COMPLAINT:   Chief Complaint  Patient presents with  . Shortness of Breath   No new complaint this morning.  No fevers overnight.  Patient reports improvement in shortness of breath today.  REVIEW OF SYSTEMS:  Review of Systems  Constitutional: Negative for chills and fever.  HENT: Negative for hearing loss and tinnitus.   Eyes: Negative for blurred vision and double vision.  Respiratory: Positive for cough.        Shortness of breath continues to improve.  Cardiovascular: Negative for chest pain and palpitations.  Gastrointestinal: Negative for heartburn and nausea.  Genitourinary: Negative for dysuria.  Musculoskeletal: Negative for myalgias and neck pain.  Skin: Negative for rash.  Neurological: Negative for dizziness and headaches.  Psychiatric/Behavioral: Negative for hallucinations.    DRUG ALLERGIES:   Allergies  Allergen Reactions  . Latex Itching and Rash   VITALS:  Blood pressure (!) 150/98, pulse 100, temperature 98.3 F (36.8 C), temperature source Oral, resp. rate 18, height 5\' 3"  (1.6 m), weight 76.7 kg, last menstrual period 07/16/2018, SpO2 95 %. PHYSICAL EXAMINATION:  Physical Exam   GENERAL:  47 y.o.-year-old patient lying in the bed with no acute distress.  EYES: Pupils equal, round, reactive to light and accommodation. No scleral icterus. Extraocular muscles intact.  HEENT: Head atraumatic, normocephalic. Oropharynx and nasopharynx clear.  NECK:  Supple, no jugular venous distention. No thyroid enlargement, no tenderness.  LUNGS: Diminished breath sounds bilaterally.Marland Kitchen  CARDIOVASCULAR: S1, S2 normal. No murmurs, rubs, or gallops.  ABDOMEN: Soft, nontender, nondistended. Bowel sounds present. No organomegaly or mass.  EXTREMITIES: No pedal edema, cyanosis, or clubbing.  NEUROLOGIC: Cranial  nerves II through XII are intact. Muscle strength 5/5 in all extremities. Sensation intact. Gait not checked.  PSYCHIATRIC: The patient is alert and oriented x 3.  SKIN: No obvious rash, lesion, or ulcer.  LABORATORY PANEL:  Female CBC Recent Labs  Lab 07/21/18 0550  WBC 18.2*  HGB 14.0  HCT 39.4  PLT 252   ------------------------------------------------------------------------------------------------------------------ Chemistries  Recent Labs  Lab 07/17/18 1042  07/21/18 0550  NA 138   < > 137  K 3.0*   < > 4.2  CL 104   < > 108  CO2 22   < > 21*  GLUCOSE 186*   < > 312*  BUN 8   < > 11  CREATININE 0.50   < > 0.52  CALCIUM 9.3   < > 9.6  AST 26  --   --   ALT 29  --   --   ALKPHOS 96  --   --   BILITOT 0.8  --   --    < > = values in this interval not displayed.   RADIOLOGY:  No results found. ASSESSMENT AND PLAN:   48 year old female with history of hypertension, asthma comes in because of shortness of breath, cough and found to have bilateral pneumonia, influenza.  Admitted for the same.  1.  Acute respiratory failure with hypoxia secondary to worsening pneumonia and influenza A infection, Clinically improved with IV steroids , Tamiflu and antibiotics with IV azithromycin and Rocephin.   Noted worsening of bilateral pneumonia yesterday.  However clinically patient significantly improved today. Continue IV antibiotics for 1 more day with plans for possible discharge tomorrow if patient continues to improve clinically Patient  already weaned off oxygen.  Oxygen saturation of 95% on room air.  2. type Ai nfluenza: Continue Tamiflu.  3 hypokalemia: Replaced Slightly elevated troponins, troponins are coming down from 0.1-0.07.  Patient has no chest pain.  Likely due to demand ischemia.  Patient remains asymptomatic.  DVT prophylaxis; Lovenox  All the records are reviewed and case discussed with Care Management/Social Worker. Management plans discussed with the  patient, family and they are in agreement.  CODE STATUS: Full Code  TOTAL TIME TAKING CARE OF THIS PATIENT: 26 minutes.   More than 50% of the time was spent in counseling/coordination of care: YES  POSSIBLE D/C IN 1 DAY, DEPENDING ON CLINICAL CONDITION.   Barnie Sopko M.D on 07/21/2018 at 12:53 PM  Between 7am to 6pm - Pager - [phone]  After 6pm go to [email or web address] - Scientist, research (life sciences)password [email or web address]  [facility] [place] [facility]  Office  [phone]  CC: Primary care physician; Donato SchultzLowne Chase, Yvonne R, DO  Note: This dictation was prepared with Dragon dictation along with smaller phrase technology. Any transcriptional errors that result from this process are unintentional.

## 2018-07-22 LAB — CULTURE, BLOOD (ROUTINE X 2)
Culture: NO GROWTH
Culture: NO GROWTH
Special Requests: ADEQUATE
Special Requests: ADEQUATE

## 2018-07-22 MED ORDER — GUAIFENESIN ER 600 MG PO TB12: 600.0000 mg | ORAL_TABLET | Freq: Two times a day (BID) | ORAL | 0 refills | Status: DC | PRN

## 2018-07-22 MED ORDER — PREDNISONE 20 MG PO TABS: 40.0000 mg | ORAL_TABLET | Freq: Every day | ORAL | 0 refills | Status: DC

## 2018-07-22 MED ORDER — AZITHROMYCIN 250 MG PO TABS: 500.0000 mg | ORAL_TABLET | Freq: Every day | ORAL | 0 refills | Status: DC

## 2018-07-22 MED ORDER — FLUCONAZOLE 150 MG PO TABS: 150.0000 mg | ORAL_TABLET | Freq: Once | ORAL | 0 refills | Status: AC

## 2018-07-22 MED ORDER — ACETAMINOPHEN 325 MG PO TABS: 650.0000 mg | ORAL_TABLET | Freq: Four times a day (QID) | ORAL | 0 refills | Status: AC | PRN

## 2018-07-22 NOTE — Discharge Summary (Signed)
[facility] - [place] at [facility]   PATIENT NAME: Kimberly Rollins    MR#:  [id number]  DATE OF BIRTH:  1971/06/06  DATE OF ADMISSION:  07/17/2018   ADMITTING PHYSICIAN: Milagros LollSrikar Sudini, MD  DATE OF DISCHARGE: 07/22/2018  PRIMARY CARE PHYSICIAN: Donato SchultzLowne Chase, Yvonne R, DO   ADMISSION DIAGNOSIS:  Moderate persistent asthma with acute exacerbation [J45.41] Multifocal pneumonia [J18.9] DISCHARGE DIAGNOSIS:  Active Problems:   Pneumonia  SECONDARY DIAGNOSIS:   Past Medical History:  Diagnosis Date  . Asthma    Seasonal  . Hyperlipidemia   . Hypertension    HOSPITAL COURSE:  Chief complaint; shortness of breath  History of presenting complaint Kimberly Rollins  is a 48 y.o. female with a known history of Hypertension, HL, Asthma here with 3 days of SOB, cough and chest pain with coughing. Had a phone MD consult on Saturday and was prescribed prednisone.Patient continued to worsen and went to urgent care .  There x-ray showed bilateral pneumonia.  She had conversational dyspnea with wheezing.    Was found to have influenza A infection.  Admitted to medical service for further evaluation.  Please refer to the H&P dictated for further details.  Hospital course; 1.Acute respiratory failure with hypoxia secondary to worsening pneumonia and influenza A infection, Clinically improved with IV steroids , Tamiflu and antibiotics with IV azithromycin and Rocephin.   Noted worsening of bilateral pneumonia recently.  However patient subsequently improved clinically.  Continues to improve clinically.  Weaned off oxygen.  Ambulating well with no shortness of breath.  Still has some intermittent cough.  Remains afebrile.  Clinically and hemodynamically stable.  Already weaned off oxygen with oxygen saturation of 94% on room air.  Plans for discharge home on p.o. antibiotics to complete treatment duration for the pneumonia.  Patient completed treatment with Tamiflu today.   Continue bronchodilators at home as needed.  Patient requested for prescription for Diflucan 150 mg x 1 dose due to concern for fungal infection from being on antibiotics.  2. type Ai nfluenza: Completed treatment with Tamiflu   3 hypokalemia: Replaced Slightly elevated troponins, troponins are coming down from 0.1-0.07. Patient has no chest pain.  Likely due to demand ischemia.  Patient remains asymptomatic.  Clinically and hemodynamically stable for discharge.  Follow-up with primary care physician.    DISCHARGE CONDITIONS:  Stable CONSULTS OBTAINED:   DRUG ALLERGIES:   Allergies  Allergen Reactions  . Latex Itching and Rash   DISCHARGE MEDICATIONS:   Allergies as of 07/22/2018      Reactions   Latex Itching, Rash      Medication List    STOP taking these medications   ciprofloxacin 250 MG tablet Commonly known as:  CIPRO     TAKE these medications   acetaminophen 325 MG tablet Commonly known as:  TYLENOL Take 2 tablets (650 mg total) by mouth every 6 (six) hours as needed for mild pain (or Fever >/= 101).   amLODipine 5 MG tablet Commonly known as:  NORVASC TAKE 1 TABLET(5 MG) BY MOUTH DAILY   aspirin 81 MG chewable tablet Chew 81 mg by mouth daily.   atorvastatin 20 MG tablet Commonly known as:  LIPITOR TAKE 1 TABLET(20 MG) BY MOUTH DAILY   azithromycin 250 MG tablet Commonly known as:  ZITHROMAX Take 2 tablets (500 mg total) by mouth daily.   Diclofenac Sodium 2 % Soln Place 2 g onto the skin 2 (two) times daily.   fluconazole 150 MG  tablet Commonly known as:  DIFLUCAN Take 1 tablet (150 mg total) by mouth once for 1 dose. Take 1 tablet by mouth once. Repeat in one week if needed. What changed:    how much to take  how to take this  when to take this   guaiFENesin 600 MG 12 hr tablet Commonly known as:  MUCINEX Take 1 tablet (600 mg total) by mouth 2 (two) times daily as needed.   lisinopril 20 MG tablet Commonly known as:   PRINIVIL,ZESTRIL TAKE 1 TABLET(20 MG) BY MOUTH DAILY   meloxicam 15 MG tablet Commonly known as:  MOBIC TAKE 1 TABLET BY MOUTH EVERY DAY AS NEEDED   multivitamin tablet Take 1 tablet by mouth daily.   predniSONE 20 MG tablet Commonly known as:  DELTASONE Take 2 tablets (40 mg total) by mouth daily.   PROAIR HFA 108 (90 Base) MCG/ACT inhaler Generic drug:  albuterol INHALE 2 PUFFS INTO THE LUNGS EVERY 6 HOURS AS NEEDED FOR WHEEZING OR SHORTNESS OF BREATH   varenicline 0.5 MG X 11 & 1 MG X 42 tablet Commonly known as:  CHANTIX STARTING MONTH PAK Take one 0.5 mg tablet by mouth once daily for 3 days, then increase to one 0.5 mg tablet twice daily for 4 days, then increase to one 1 mg tablet twice daily.   varenicline 0.5 MG X 11 & 1 MG X 42 tablet Commonly known as:  CHANTIX STARTING MONTH PAK Take one 0.5 mg tablet by mouth once daily for 3 days, then increase to one 0.5 mg tablet twice daily for 4 days, then increase to one 1 mg tablet twice daily.        DISCHARGE INSTRUCTIONS:   DIET:  Cardiac diet DISCHARGE CONDITION:  Stable ACTIVITY:  Activity as tolerated OXYGEN:  Home Oxygen: No.  Oxygen Delivery: room air DISCHARGE LOCATION:  home   If you experience worsening of your admission symptoms, develop shortness of breath, life threatening emergency, suicidal or homicidal thoughts you must seek medical attention immediately by calling 911 or calling your MD immediately  if symptoms less severe.  You Must read complete instructions/literature along with all the possible adverse reactions/side effects for all the Medicines you take and that have been prescribed to you. Take any new Medicines after you have completely understood and accpet all the possible adverse reactions/side effects.   Please note  You were cared for by a hospitalist during your hospital stay. If you have any questions about your discharge medications or the care you received while you were in the  hospital after you are discharged, you can call the unit and asked to speak with the hospitalist on call if the hospitalist that took care of you is not available. Once you are discharged, your primary care physician will handle any further medical issues. Please note that NO REFILLS for any discharge medications will be authorized once you are discharged, as it is imperative that you return to your primary care physician (or establish a relationship with a primary care physician if you do not have one) for your aftercare needs so that they can reassess your need for medications and monitor your lab values.    On the day of Discharge:  VITAL SIGNS:  Blood pressure (!) 132/94, pulse 88, temperature (!) 97.5 F (36.4 C), temperature source Oral, resp. rate 18, height 5\' 3"  (1.6 m), weight 73.9 kg, last menstrual period 07/16/2018, SpO2 98 %. PHYSICAL EXAMINATION:  GENERAL:  48 y.o.-year-old patient lying  in the bed with no acute distress.  EYES: Pupils equal, round, reactive to light and accommodation. No scleral icterus. Extraocular muscles intact.  HEENT: Head atraumatic, normocephalic. Oropharynx and nasopharynx clear.  NECK:  Supple, no jugular venous distention. No thyroid enlargement, no tenderness.  LUNGS: Normal breath sounds bilaterally, no wheezing, rales,rhonchi or crepitation. No use of accessory muscles of respiration.  CARDIOVASCULAR: S1, S2 normal. No murmurs, rubs, or gallops.  ABDOMEN: Soft, non-tender, non-distended. Bowel sounds present. No organomegaly or mass.  EXTREMITIES: No pedal edema, cyanosis, or clubbing.  NEUROLOGIC: Cranial nerves II through XII are intact. Muscle strength 5/5 in all extremities. Sensation intact. Gait not checked.  PSYCHIATRIC: The patient is alert and oriented x 3.  SKIN: No obvious rash, lesion, or ulcer.  DATA REVIEW:   CBC Recent Labs  Lab 07/21/18 0550  WBC 18.2*  HGB 14.0  HCT 39.4  PLT 252    Chemistries  Recent Labs  Lab  07/17/18 1042  07/21/18 0550  NA 138   < > 137  K 3.0*   < > 4.2  CL 104   < > 108  CO2 22   < > 21*  GLUCOSE 186*   < > 312*  BUN 8   < > 11  CREATININE 0.50   < > 0.52  CALCIUM 9.3   < > 9.6  AST 26  --   --   ALT 29  --   --   ALKPHOS 96  --   --   BILITOT 0.8  --   --    < > = values in this interval not displayed.     Microbiology Results  Results for orders placed or performed during the hospital encounter of 07/17/18  Blood Culture (routine x 2)     Status: None   Collection Time: 07/17/18  3:44 PM  Result Value Ref Range Status   Specimen Description BLOOD RIGHT ANTECUBITAL  Final   Special Requests   Final    BOTTLES DRAWN AEROBIC AND ANAEROBIC Blood Culture adequate volume   Culture   Final    NO GROWTH 5 DAYS Performed at [facility], [address], [place], [place] [address]    Report Status 07/22/2018 FINAL  Final  Blood Culture (routine x 2)     Status: None   Collection Time: 07/17/18  3:44 PM  Result Value Ref Range Status   Specimen Description BLOOD BLOOD LEFT FOREARM  Final   Special Requests   Final    BOTTLES DRAWN AEROBIC AND ANAEROBIC Blood Culture adequate volume   Culture   Final    NO GROWTH 5 DAYS Performed at [facility], [address], [place], [place] [address]    Report Status 07/22/2018 FINAL  Final  Expectorated sputum assessment w rflx to resp cult     Status: None   Collection Time: 07/18/18  9:09 AM  Result Value Ref Range Status   Specimen Description SPUTUM  Final   Special Requests NONE  Final   Sputum evaluation   Final    THIS SPECIMEN IS ACCEPTABLE FOR SPUTUM CULTURE Performed at [facility], [address] [place], [place] [address]    Report Status 07/18/2018 FINAL  Final  Culture, respiratory     Status: None   Collection Time: 07/18/18  9:09 AM  Result Value Ref Range Status   Specimen Description   Final    SPUTUM Performed at [facility], [address]  [place], [place] [address]    Special Requests   Final    NONE Reflexed from [id number] Performed at [facility], [address] [place], [place] [address]    Gram Stain   Final    RARE WBC PRESENT, PREDOMINANTLY PMN FEW GRAM POSITIVE RODS RARE GRAM NEGATIVE COCCOBACILLI    Culture   Final    FEW Consistent with normal respiratory flora. Performed at [facility], [address] [place], [place] [address]    Report Status 07/20/2018 FINAL  Final    RADIOLOGY:  No results found.   Management plans discussed with the patient, family and they are in agreement.  CODE STATUS: Full Code   TOTAL TIME TAKING CARE OF THIS PATIENT: 37 minutes.    Zyana Amaro M.D on 07/22/2018 at 11:26 AM  Between 7am to 6pm - Pager - [phone]  After 6pm go to [email or web address] - Scientist, research (life sciences)password [email or web address] [email or web address]  [facility] [place] [facility]  Office  [phone]  CC: Primary care physician; Donato SchultzLowne Chase, Yvonne R, DO   Note: This dictation was prepared with Dragon dictation along with smaller phrase technology. Any transcriptional errors that result from this process are unintentional.

## 2018-07-23 ENCOUNTER — Telehealth: Payer: Self-pay | Admitting: *Deleted

## 2018-07-23 NOTE — Telephone Encounter (Signed)
Left voicemail to call our office.  

## 2018-07-24 ENCOUNTER — Inpatient Hospital Stay: Payer: Self-pay | Admitting: Family Medicine

## 2018-07-24 NOTE — Telephone Encounter (Signed)
Saw pt has scheduled hospital follow up for this morning 07/24/18

## 2018-08-02 ENCOUNTER — Other Ambulatory Visit: Payer: Self-pay

## 2018-08-02 ENCOUNTER — Ambulatory Visit (INDEPENDENT_AMBULATORY_CARE_PROVIDER_SITE_OTHER): Payer: 59 | Admitting: Nurse Practitioner

## 2018-08-02 ENCOUNTER — Encounter: Payer: Self-pay | Admitting: Nurse Practitioner

## 2018-08-02 VITALS — BP 117/65 | HR 70 | Temp 98.1°F | Resp 16 | Ht 62.5 in | Wt 164.8 lb

## 2018-08-02 DIAGNOSIS — Z7689 Persons encountering health services in other specified circumstances: Secondary | ICD-10-CM | POA: Diagnosis not present

## 2018-08-02 DIAGNOSIS — I10 Essential (primary) hypertension: Secondary | ICD-10-CM

## 2018-08-02 DIAGNOSIS — J4520 Mild intermittent asthma, uncomplicated: Secondary | ICD-10-CM | POA: Diagnosis not present

## 2018-08-02 DIAGNOSIS — Z09 Encounter for follow-up examination after completed treatment for conditions other than malignant neoplasm: Secondary | ICD-10-CM

## 2018-08-02 DIAGNOSIS — Z716 Tobacco abuse counseling: Secondary | ICD-10-CM

## 2018-08-02 DIAGNOSIS — E785 Hyperlipidemia, unspecified: Secondary | ICD-10-CM

## 2018-08-02 DIAGNOSIS — J189 Pneumonia, unspecified organism: Secondary | ICD-10-CM

## 2018-08-02 DIAGNOSIS — M542 Cervicalgia: Secondary | ICD-10-CM | POA: Diagnosis not present

## 2018-08-02 MED ORDER — VARENICLINE TARTRATE 0.5 MG X 11 & 1 MG X 42 PO MISC: ORAL | 0 refills | Status: DC

## 2018-08-02 NOTE — Progress Notes (Signed)
Subjective:    Patient ID: Kimberly Rollins, female    DOB: 10/07/1970, 48 y.o.   MRN: [id number]  Kimberly Rollins is a 48 y.o. female presenting on 08/02/2018 for Establish Care; Neck and shoulder pain; and ER fu visit s/p pneumonia and Flu A   HPI Establish Care New Provider Pt last seen by PCP Dr. Laury Axon about 1 years ago.  Obtain records from [facility].    Thumb Patient has trigger finger in both thumbs, left thumb has not fully resolved.  Has been seen recently by Dr. Katrinka Blazing at [facility] with some relief.  Does not want to keep going to [place] if at all possible.  ER follow-up Patient was hospitalized for pneumonia 2/2 Flu A, feels better but not herslef yet.  Is back to work this week, but is taking it slow.  Remains tired, fatigued. - Patient notes breathing is much better, but breathing with activity is persistent.  Neck/shoulder pain Main concern is soreness of shoulders and upper chest/anterior shoulders.  Has massage gun with some relief.  This is new since hospitalization.  Feels "sharp, stiff neck pain."  Now has some increased pain today due to massage gun use last night, but notes the relief   Past Medical History:  Diagnosis Date  . Asthma    Seasonal  . Hyperlipidemia   . Hypertension    Past Surgical History:  Procedure Laterality Date  . BREAST REDUCTION SURGERY    . CESAREAN SECTION  1998   Social History   Socioeconomic History  . Marital status: Single    Spouse name: Not on file  . Number of children: Not on file  . Years of education: Not on file  . Highest education level: Not on file  Occupational History  . Occupation: [employer] home    Employer: [employer]  Social Needs  . Financial resource strain: Not on file  . Food insecurity:    Worry: Not on file    Inability: Not on file  . Transportation needs:    Medical: Not on file    Non-medical: Not on file  Tobacco Use  . Smoking status: Former Smoker    Packs/day: 1.00      Years: 15.00    Pack years: 15.00    Types: Cigarettes    Last attempt to quit: 07/10/2018    Years since quitting: 0.0  . Smokeless tobacco: Never Used  . Tobacco comment: program at work--pt will try-- trying to cut down  Substance and Sexual Activity  . Alcohol use: Yes    Alcohol/week: 0.0 standard drinks    Comment: Socially  . Drug use: No  . Sexual activity: Yes    Partners: Female    Birth control/protection: Surgical    Comment: partner surgical  Lifestyle  . Physical activity:    Days per week: Not on file    Minutes per session: Not on file  . Stress: Not on file  Relationships  . Social connections:    Talks on phone: Not on file    Gets together: Not on file    Attends religious service: Not on file    Active member of club or organization: Not on file    Attends meetings of clubs or organizations: Not on file    Relationship status: Not on file  . Intimate partner violence:    Fear of current or ex partner: Not on file    Emotionally abused: Not on file  Physically abused: Not on file    Forced sexual activity: Not on file  Other Topics Concern  . Not on file  Social History Narrative   Exercising-- no   Family History  Problem Relation Age of Onset  . Hypertension Father   . Diabetes Father   . COPD Mother   . Hypertension Brother   . Healthy Daughter   . Breast cancer Neg Hx    Current Outpatient Medications on File Prior to Visit  Medication Sig  . acetaminophen (TYLENOL) 325 MG tablet Take 2 tablets (650 mg total) by mouth every 6 (six) hours as needed for mild pain (or Fever >/= 101).  Marland Kitchen amLODipine (NORVASC) 5 MG tablet TAKE 1 TABLET(5 MG) BY MOUTH DAILY  . aspirin 81 MG chewable tablet Chew 81 mg by mouth daily.  Marland Kitchen atorvastatin (LIPITOR) 20 MG tablet TAKE 1 TABLET(20 MG) BY MOUTH DAILY  . lisinopril (PRINIVIL,ZESTRIL) 20 MG tablet TAKE 1 TABLET(20 MG) BY MOUTH DAILY  . Multiple Vitamin (MULTIVITAMIN) tablet Take 1 tablet by mouth daily.   Marland Kitchen PROAIR HFA 108 (90 Base) MCG/ACT inhaler INHALE 2 PUFFS INTO THE LUNGS EVERY 6 HOURS AS NEEDED FOR WHEEZING OR SHORTNESS OF BREATH   No current facility-administered medications on file prior to visit.     Review of Systems  Constitutional: Negative for activity change, appetite change and fatigue.  Respiratory: Negative for cough and shortness of breath.   Cardiovascular: Negative for chest pain, palpitations and leg swelling.  Gastrointestinal: Negative for constipation, diarrhea, nausea and vomiting.  Genitourinary: Negative for dysuria, frequency and urgency.  Musculoskeletal: Negative for arthralgias and myalgias.  Skin: Negative for rash.  Neurological: Negative for dizziness and headaches.  Psychiatric/Behavioral: Negative for dysphoric mood. The patient is not nervous/anxious.    Per HPI unless specifically indicated above     Objective:    BP 117/65   Pulse 70   Temp 98.1 F (36.7 C) (Oral)   Resp 16   Ht 5' 2.5" (1.588 m)   Wt 164 lb 12.8 oz (74.8 kg)   LMP 07/16/2018 (Exact Date)   SpO2 100%   BMI 29.66 kg/m   Wt Readings from Last 3 Encounters:  08/02/18 164 lb 12.8 oz (74.8 kg)  07/22/18 162 lb 14.4 oz (73.9 kg)  07/17/18 160 lb (72.6 kg)    Physical Exam Vitals signs reviewed.  Constitutional:      General: She is not in acute distress.    Appearance: She is well-developed.  HENT:     Head: Normocephalic and atraumatic.     Right Ear: Tympanic membrane, ear canal and external ear normal.     Left Ear: Tympanic membrane, ear canal and external ear normal.     Nose: Nose normal.     Mouth/Throat:     Mouth: Mucous membranes are moist.     Pharynx: Oropharynx is clear.  Cardiovascular:     Rate and Rhythm: Normal rate and regular rhythm.     Pulses:          Radial pulses are 2+ on the right side and 2+ on the left side.       Posterior tibial pulses are 1+ on the right side and 1+ on the left side.     Heart sounds: Normal heart sounds, S1 normal  and S2 normal.  Pulmonary:     Effort: Pulmonary effort is normal. No respiratory distress.     Breath sounds: Normal breath sounds and air entry. No  wheezing, rhonchi or rales.  Abdominal:     General: Abdomen is flat. Bowel sounds are normal.     Palpations: Abdomen is soft.  Musculoskeletal:     Right lower leg: No edema.     Left lower leg: No edema.     Comments: C-Spine Inspection: Normal appearance, normal body habitus, no spinal deformity, symmetrical. Palpation: No tenderness over spinous processes. Bilateral cervical paraspinal muscles tender and with hypertonicity/spasm.  Bilateral trapezius muscles tender and with hypertonicity/spasm ROM: Full active ROM forward flex / posterior extension, rotation L/R without discomfort, Lateral bending L/R without discomfort Special Testing: Spurling's test negative for pain, pincer grasp normal strength  Strength: Bilateral hip flex/ext 5/5, knee flex/ext 5/5, ankle dorsiflex/plantarflex 5/5 Neurovascular: intact distal sensation to light touch  Bilateral Shoulder Inspection: Normal appearance bilateral symmetrical Palpation: Non-tender to palpation over anterior, lateral, or posterior shoulder  ROM: Full intact active ROM forward flexion, abduction, internal / external rotation, symmetrical Special Testing: Rotator cuff testing negative for weakness with supraspinatus full can and empty can test, O'brien's negative for labral pain, Hawkin's AC impingement negative for pain Strength: Normal strength 5/5 flex/ext, ext rot / int rot, grip, rotator cuff str testing. Neurovascular: Distally intact pulses, sensation to light touch  Skin:    General: Skin is warm and dry.     Capillary Refill: Capillary refill takes less than 2 seconds.  Neurological:     General: No focal deficit present.     Mental Status: She is alert and oriented to person, place, and time. Mental status is at baseline.  Psychiatric:        Attention and Perception:  Attention normal.        Mood and Affect: Mood and affect normal.        Behavior: Behavior normal. Behavior is cooperative.        Thought Content: Thought content normal.        Judgment: Judgment normal.     Results for orders placed or performed in visit on 08/02/18  CBC with Differential  Result Value Ref Range   WBC 10.6 3.8 - 10.8 Thousand/uL   RBC 4.83 3.80 - 5.10 Million/uL   Hemoglobin 15.0 11.7 - 15.5 g/dL   HCT 16.1 09.6 - 04.5 %   MCV 90.9 80.0 - 100.0 fL   MCH 31.1 27.0 - 33.0 pg   MCHC 34.2 32.0 - 36.0 g/dL   RDW 40.9 81.1 - 91.4 %   Platelets 313 140 - 400 Thousand/uL   MPV 11.1 7.5 - 12.5 fL   Neutro Abs 6,826 1,500 - 7,800 cells/uL   Lymphs Abs 2,862 850 - 3,900 cells/uL   Absolute Monocytes 456 200 - 950 cells/uL   Eosinophils Absolute 382 15 - 500 cells/uL   Basophils Absolute 74 0 - 200 cells/uL   Neutrophils Relative % 64.4 %   Total Lymphocyte 27.0 %   Monocytes Relative 4.3 %   Eosinophils Relative 3.6 %   Basophils Relative 0.7 %  COMPLETE METABOLIC PANEL WITH GFR  Result Value Ref Range   Glucose, Bld 89 65 - 139 mg/dL   BUN 10 7 - 25 mg/dL   Creat 7.82 9.56 - 2.13 mg/dL   GFR, Est Non African American 114 > OR = 60 mL/min/1.18m2   GFR, Est African American 132 > OR = 60 mL/min/1.21m2   BUN/Creatinine Ratio NOT APPLICABLE 6 - 22 (calc)   Sodium 143 135 - 146 mmol/L   Potassium 4.2 3.5 - 5.3 mmol/L  Chloride 108 98 - 110 mmol/L   CO2 27 20 - 32 mmol/L   Calcium 10.3 (H) 8.6 - 10.2 mg/dL   Total Protein 6.7 6.1 - 8.1 g/dL   Albumin 4.2 3.6 - 5.1 g/dL   Globulin 2.5 1.9 - 3.7 g/dL (calc)   AG Ratio 1.7 1.0 - 2.5 (calc)   Total Bilirubin 0.5 0.2 - 1.2 mg/dL   Alkaline phosphatase (APISO) 92 31 - 125 U/L   AST 27 10 - 35 U/L   ALT 47 (H) 6 - 29 U/L  Troponin I  Result Value Ref Range   Troponin I <0.01 < OR = 0.0 ng/mL  Lipid panel  Result Value Ref Range   Cholesterol 183 <200 mg/dL   HDL 44 (L) > OR = 50 mg/dL   Triglycerides 616 <073  mg/dL   LDL Cholesterol (Calc) 114 (H) mg/dL (calc)   Total CHOL/HDL Ratio 4.2 <5.0 (calc)   Non-HDL Cholesterol (Calc) 139 (H) <130 mg/dL (calc)      Assessment & Plan:   Problem List Items Addressed This Visit      Cardiovascular and Mediastinum   HTN (hypertension)     Respiratory   Asthma in adult   Pneumonia   Relevant Orders   CBC with Differential (Completed)   COMPLETE METABOLIC PANEL WITH GFR (Completed)   Troponin I (Completed)     Other   Hyperlipidemia   Relevant Orders   Lipid panel (Completed)    Other Visit Diagnoses    Hospital discharge follow-up    -  Primary   Relevant Orders   CBC with Differential (Completed)   COMPLETE METABOLIC PANEL WITH GFR (Completed)   Troponin I (Completed)   Encounter to establish care       Neck pain, bilateral posterior       Encounter for smoking cessation counseling       Relevant Medications   varenicline (CHANTIX STARTING MONTH PAK) 0.5 MG X 11 & 1 MG X 42 tablet       # Previous PCP was at [facility] in [place].  Records are reviewed in [facility].  Past medical, family, and surgical history reviewed w/ patient in clinic.  # ER follow-up from influenza A and secondary pneumonia.   Patient improving today, continues to feel deconditioned, weak.  Breathing returning to normal.  Continue regular activity, slow progression back to work.  Follow-up prn.  # neck/ shoulder pain Pain likely self-limited trapezius muscle strain.  Muscle strain possible complicated by deconditioning from hospitalization and recent illness..  Plan:  1. Treat with OTC pain meds (acetaminophen and ibuprofen).  Discussed alternate dosing and max dosing. 2. Apply heat and/or ice to affected area. 3. May also apply a muscle rub with lidocaine or lidocaine patch after heat or ice. 4. Follow up 2-4 weeks prn   # Patient desires to quit smoking and wants to try Chantix.  She has used a starting pack in past, but didn't continue it after the first week  due to lack of commitment to quitting. Desires restart today.  Discussion today >5 minutes (<10 minutes) specifically on counseling on risks of tobacco use, complications, treatment, smoking cessation.  Plan: 1. 7 days prior to quit date, START Chantix starting pack.  Take 0.5 mg tablet once daily w/ food for 3 days.  Take 0.5 mg tablet twice daily w/ food for 3 days.  Then, take 1 mg tablet twice daily and continue for total treatment w/ Chantix of 4-12 weeks.  Stop smoking or reduce by half on quit date. - Reviewed side effects of nausea, vivid dreams, depression, possible SI.    # Controlled hypertension.  BP goal < 130/80.  Pt is working on lifestyle modifications.  Taking medications tolerating well without side effects. No current complications.  Plan: 1. Continue taking medications without change 2. Obtain labs today  3. Encouraged heart healthy diet and increasing exercise to 30 minutes most days of the week. 4. Check BP 1-2 x per week at home, keep log, and bring to clinic at next appointment. 5. Follow up 6 months.     # Hyperlipidemia Previously controlled hyperlipidemia.  Currently taking statin medication without side effects. No new ASCVD events since last visit.   - No personal history of prior ASCVD events  Plan: 1. Continue atorvastatin 20 mg once daily. - Reviewed common side effects of myalgia (reversible off med), less common side effects of cognitive impairment (reversible off med), increased glucose, rhabdomyolysis. 2. Discussed low glycemic diet.  3. Discussed increasing exercise to 30 minutes most days of the week. 4. Follow-up with labs in about 3 months.      Meds ordered this encounter  Medications  . varenicline (CHANTIX STARTING MONTH PAK) 0.5 MG X 11 & 1 MG X 42 tablet    Sig: Take one 0.5 mg tab by mouth once daily for 3 days, then take one 0.5 mg tab twice daily for 4 days, then take one 1 mg tab twice daily.    Dispense:  53 tablet    Refill:  0     Order Specific Question:   Supervising Provider    Answer:   Smitty CordsKARAMALEGOS, ALEXANDER J [[id number]]    Follow up plan: Return in about 6 months (around 01/31/2019) for annual physical AND 1 year for blood pressure, choleserol, asthma.  Wilhelmina McardleLauren Derrell Milanes, DNP, AGPCNP-BC Adult Gerontology Primary Care Nurse Practitioner [facility] 08/02/2018, 10:53 AM

## 2018-08-02 NOTE — Patient Instructions (Addendum)
Kimberly Rollins,   Thank you for coming in to clinic today.  1. Continue current blood pressure and cholesterol meds.  2. You have a trapezius muscle strain. Likely caused by deconditioning. - Start taking Tylenol extra strength 1 to 2 tablets every 6-8 hours for aches or fever/chills for next few days as needed.  Do not take more than 3,000 mg in 24 hours from all medicines.  May take Ibuprofen as well if tolerated 200-400mg  every 8 hours as needed. May alternate tylenol and ibuprofen in same day.  - Use heat and ice.  Apply this for 15 minutes at a time 6-8 times per day.   - Muscle rub with lidocaine, lidocaine patch, Biofreeze, or tiger balm for topical pain relief.  Avoid using this with heat and ice to avoid burns.  Please schedule a follow-up appointment with Wilhelmina Mcardle, AGNP.  Return in about 6 months (around 01/31/2019) for annual physical AND 1 year for blood pressure, choleserol, asthma.  If you have any other questions or concerns, please feel free to call the clinic or send a message through MyChart. You may also schedule an earlier appointment if necessary.  You will receive a survey after today's visit either digitally by e-mail or paper by [employer]. Your experiences and feedback matter to [place].  Please respond so we know how we are doing as we provide care for you.   Wilhelmina Mcardle, DNP, AGNP-BC Adult Gerontology Nurse Practitioner [facility], [facility]

## 2018-08-03 ENCOUNTER — Ambulatory Visit: Payer: Self-pay | Admitting: Family Medicine

## 2018-08-03 LAB — COMPLETE METABOLIC PANEL WITH GFR
AG Ratio: 1.7 (calc) (ref 1.0–2.5)
ALT: 47 U/L — ABNORMAL HIGH (ref 6–29)
AST: 27 U/L (ref 10–35)
Albumin: 4.2 g/dL (ref 3.6–5.1)
Alkaline phosphatase (APISO): 92 U/L (ref 31–125)
BUN: 10 mg/dL (ref 7–25)
CO2: 27 mmol/L (ref 20–32)
Calcium: 10.3 mg/dL — ABNORMAL HIGH (ref 8.6–10.2)
Chloride: 108 mmol/L (ref 98–110)
Creat: 0.51 mg/dL (ref 0.50–1.10)
GFR, Est African American: 132 mL/min/{1.73_m2} (ref >=60)
GFR, Est Non African American: 114 mL/min/{1.73_m2} (ref >=60)
Globulin: 2.5 g/dL (calc) (ref 1.9–3.7)
Glucose, Bld: 89 mg/dL (ref 65–139)
Potassium: 4.2 mmol/L (ref 3.5–5.3)
Sodium: 143 mmol/L (ref 135–146)
Total Bilirubin: 0.5 mg/dL (ref 0.2–1.2)
Total Protein: 6.7 g/dL (ref 6.1–8.1)

## 2018-08-03 LAB — LIPID PANEL
Cholesterol: 183 mg/dL (ref <200)
HDL: 44 mg/dL — ABNORMAL LOW (ref >=50)
LDL Cholesterol (Calc): 114 mg/dL (calc) — ABNORMAL HIGH
Non-HDL Cholesterol (Calc): 139 mg/dL (calc) — ABNORMAL HIGH (ref <130)
Total CHOL/HDL Ratio: 4.2 (calc) (ref <5.0)
Triglycerides: 140 mg/dL (ref <150)

## 2018-08-03 LAB — TROPONIN I: Troponin I: <0.01 ng/mL (ref <=0.0)

## 2018-08-03 LAB — CBC WITH DIFFERENTIAL/PLATELET
Absolute Monocytes: 456 cells/uL (ref 200–950)
Basophils Absolute: 74 cells/uL (ref 0–200)
Basophils Relative: 0.7 %
Eosinophils Absolute: 382 cells/uL (ref 15–500)
Eosinophils Relative: 3.6 %
HCT: 43.9 % (ref 35.0–45.0)
Hemoglobin: 15 g/dL (ref 11.7–15.5)
Lymphs Abs: 2862 cells/uL (ref 850–3900)
MCH: 31.1 pg (ref 27.0–33.0)
MCHC: 34.2 g/dL (ref 32.0–36.0)
MCV: 90.9 fL (ref 80.0–100.0)
MPV: 11.1 fL (ref 7.5–12.5)
Monocytes Relative: 4.3 %
Neutro Abs: 6826 cells/uL (ref 1500–7800)
Neutrophils Relative %: 64.4 %
Platelets: 313 10*3/uL (ref 140–400)
RBC: 4.83 10*6/uL (ref 3.80–5.10)
RDW: 12.9 % (ref 11.0–15.0)
Total Lymphocyte: 27 %
WBC: 10.6 10*3/uL (ref 3.8–10.8)

## 2018-08-08 ENCOUNTER — Encounter: Payer: Self-pay | Admitting: Nurse Practitioner

## 2018-08-16 ENCOUNTER — Other Ambulatory Visit: Payer: Self-pay | Admitting: Family Medicine

## 2018-08-17 ENCOUNTER — Other Ambulatory Visit: Payer: Self-pay | Admitting: Nurse Practitioner

## 2018-09-10 ENCOUNTER — Other Ambulatory Visit: Payer: Self-pay

## 2018-10-11 ENCOUNTER — Other Ambulatory Visit: Payer: Self-pay | Admitting: Nurse Practitioner

## 2018-11-12 ENCOUNTER — Other Ambulatory Visit: Payer: Self-pay | Admitting: Family Medicine

## 2018-11-12 DIAGNOSIS — E785 Hyperlipidemia, unspecified: Secondary | ICD-10-CM

## 2018-11-19 ENCOUNTER — Other Ambulatory Visit: Payer: Self-pay | Admitting: Nurse Practitioner

## 2018-11-19 DIAGNOSIS — I10 Essential (primary) hypertension: Secondary | ICD-10-CM

## 2018-11-19 DIAGNOSIS — E785 Hyperlipidemia, unspecified: Secondary | ICD-10-CM

## 2018-11-19 MED ORDER — ATORVASTATIN CALCIUM 20 MG PO TABS: 20.0000 mg | ORAL_TABLET | Freq: Every day | ORAL | 0 refills | Status: DC

## 2018-11-19 MED ORDER — AMLODIPINE BESYLATE 5 MG PO TABS: 5.0000 mg | ORAL_TABLET | Freq: Every day | ORAL | 0 refills | Status: DC

## 2018-11-19 MED ORDER — LISINOPRIL 20 MG PO TABS: 20.0000 mg | ORAL_TABLET | Freq: Every day | ORAL | 0 refills | Status: DC

## 2018-11-19 NOTE — Telephone Encounter (Signed)
Pt  Called requesting that ALL her  Medication be called into [employer] . Pt  Call back  # is  [phone]

## 2018-11-28 ENCOUNTER — Other Ambulatory Visit: Payer: Self-pay | Admitting: Nurse Practitioner

## 2018-11-28 DIAGNOSIS — Z1231 Encounter for screening mammogram for malignant neoplasm of breast: Secondary | ICD-10-CM

## 2018-12-26 ENCOUNTER — Other Ambulatory Visit: Payer: Self-pay | Admitting: Family Medicine

## 2019-01-10 ENCOUNTER — Other Ambulatory Visit: Payer: Self-pay

## 2019-01-10 ENCOUNTER — Ambulatory Visit
Admission: RE | Admit: 2019-01-10 | Discharge: 2019-01-10 | Disposition: A | Discharge status: Home / Self Care | Payer: 59 | Source: Ambulatory Visit | Attending: Nurse Practitioner | Admitting: Nurse Practitioner

## 2019-01-10 DIAGNOSIS — Z1231 Encounter for screening mammogram for malignant neoplasm of breast: Secondary | ICD-10-CM

## 2019-02-04 ENCOUNTER — Encounter: Payer: Self-pay | Admitting: Family Medicine

## 2019-02-06 ENCOUNTER — Other Ambulatory Visit (HOSPITAL_COMMUNITY)
Admission: RE | Admit: 2019-02-06 | Discharge: 2019-02-06 | Disposition: A | Discharge status: Home / Self Care | Payer: 59 | Source: Ambulatory Visit | Attending: Nurse Practitioner | Admitting: Nurse Practitioner

## 2019-02-06 ENCOUNTER — Ambulatory Visit (INDEPENDENT_AMBULATORY_CARE_PROVIDER_SITE_OTHER): Payer: 59 | Admitting: Nurse Practitioner

## 2019-02-06 ENCOUNTER — Other Ambulatory Visit: Payer: Self-pay

## 2019-02-06 ENCOUNTER — Encounter: Payer: Self-pay | Admitting: Nurse Practitioner

## 2019-02-06 VITALS — BP 133/91 | HR 80 | Ht 62.5 in | Wt 165.0 lb

## 2019-02-06 DIAGNOSIS — B373 Candidiasis of vulva and vagina: Secondary | ICD-10-CM | POA: Diagnosis not present

## 2019-02-06 DIAGNOSIS — Z0000 Encounter for general adult medical examination without abnormal findings: Secondary | ICD-10-CM | POA: Diagnosis not present

## 2019-02-06 DIAGNOSIS — N3001 Acute cystitis with hematuria: Secondary | ICD-10-CM

## 2019-02-06 DIAGNOSIS — Z124 Encounter for screening for malignant neoplasm of cervix: Secondary | ICD-10-CM

## 2019-02-06 DIAGNOSIS — N951 Menopausal and female climacteric states: Secondary | ICD-10-CM

## 2019-02-06 DIAGNOSIS — R3121 Asymptomatic microscopic hematuria: Secondary | ICD-10-CM

## 2019-02-06 DIAGNOSIS — B3731 Acute candidiasis of vulva and vagina: Secondary | ICD-10-CM

## 2019-02-06 LAB — POCT URINALYSIS DIPSTICK
Bilirubin, UA: NEGATIVE
Clarity, UA: NEGATIVE
Glucose, UA: NEGATIVE
Ketones, UA: NEGATIVE
Nitrite, UA: NEGATIVE
Protein, UA: POSITIVE — AB
Spec Grav, UA: <=1.005 — AB (ref 1.010–1.025)
Urobilinogen, UA: 0.2 E.U./dL
pH, UA: 5 (ref 5.0–8.0)

## 2019-02-06 MED ORDER — FLUCONAZOLE 150 MG PO TABS: 150.0000 mg | ORAL_TABLET | Freq: Once | ORAL | 0 refills | Status: AC

## 2019-02-06 NOTE — Patient Instructions (Addendum)
Kimberly Rollins,   Thank you for coming in to clinic today.  1. Can start Boric Acid suppositories for vaginal pH balance to prevent BV.  2. Set goals and start your healthier eating and regular walking.   3. You have a vaginal yeast infection.  START diflucan 150 mg one tablet today.  Call if you have BV symptoms after this and we will treat you for that as well.  4. Your irregular period is likely from perimenopause.  Until you have no period for a full 365 days, you are not postmenopausal.  Hang in there.  This is normal.    Please schedule a follow-up appointment with Cassell Smiles, AGNP. Return in about 1 year (around 02/06/2020) for annual physical.  If you have any other questions or concerns, please feel free to call the clinic or send a message through [place]. You may also schedule an earlier appointment if necessary.  You will receive a survey after today's visit either digitally by e-mail or paper by [employer]. Your experiences and feedback matter to [place].  Please respond so we know how we are doing as we provide care for you.   Cassell Smiles, DNP, AGNP-BC Adult Gerontology Nurse Practitioner [facility], [facility]   Perimenopause  Perimenopause is the normal time of life before and after menstrual periods stop completely (menopause). Perimenopause can begin 2-8 years before menopause, and it usually lasts for 1 year after menopause. During perimenopause, the ovaries may or may not produce an egg. What are the causes? This condition is caused by a natural change in hormone levels that happens as you get older. What increases the risk? This condition is more likely to start at an earlier age if you have certain medical conditions or treatments, including:  A tumor of the pituitary gland in the brain.  A disease that affects the ovaries and hormone production.  Radiation treatment for cancer.  Certain cancer treatments, such as chemotherapy or hormone  (anti-estrogen) therapy.  Heavy smoking and excessive alcohol use.  Family history of early menopause. What are the signs or symptoms? Perimenopausal changes affect each woman differently. Symptoms of this condition may include:  Hot flashes.  Night sweats.  Irregular menstrual periods.  Decreased sex drive.  Vaginal dryness.  Headaches.  Mood swings.  Depression.  Memory problems or trouble concentrating.  Irritability.  Tiredness.  Weight gain.  Anxiety.  Trouble getting pregnant. How is this diagnosed? This condition is diagnosed based on your medical history, a physical exam, your age, your menstrual history, and your symptoms. Hormone tests may also be done. How is this treated? In some cases, no treatment is needed. You and your health care provider should make a decision together about whether treatment is necessary. Treatment will be based on your individual condition and preferences. Various treatments are available, such as:  Menopausal hormone therapy (MHT).  Medicines to treat specific symptoms.  Acupuncture.  Vitamin or herbal supplements. Before starting treatment, make sure to let your health care provider know if you have a personal or family history of:  Heart disease.  Breast cancer.  Blood clots.  Diabetes.  Osteoporosis. Follow these instructions at home: Lifestyle  Do not use any products that contain nicotine or tobacco, such as cigarettes and e-cigarettes. If you need help quitting, ask your health care provider.  Eat a balanced diet that includes fresh fruits and vegetables, whole grains, soybeans, eggs, lean meat, and low-fat dairy.  Get at least 30 minutes of  physical activity on 5 or more days each week.  Avoid alcoholic and caffeinated beverages, as well as spicy foods. This may help prevent hot flashes.  Get 7-8 hours of sleep each night.  Dress in layers that can be removed to help you manage hot flashes.  Find  ways to manage stress, such as deep breathing, meditation, or journaling. General instructions  Keep track of your menstrual periods, including: ? When they occur. ? How heavy they are and how long they last. ? How much time passes between periods.  Keep track of your symptoms, noting when they start, how often you have them, and how long they last.  Take over-the-counter and prescription medicines only as told by your health care provider.  Take vitamin supplements only as told by your health care provider. These may include calcium, vitamin E, and vitamin D.  Use vaginal lubricants or moisturizers to help with vaginal dryness and improve comfort during sex.  Talk with your health care provider before starting any herbal supplements.  Keep all follow-up visits as told by your health care provider. This is important. This includes any group therapy or counseling. Contact a health care provider if:  You have heavy vaginal bleeding or pass blood clots.  Your period lasts more than 2 days longer than normal.  Your periods are recurring sooner than 21 days.  You bleed after having sex. Get help right away if:  You have chest pain, trouble breathing, or trouble talking.  You have severe depression.  You have pain when you urinate.  You have severe headaches.  You have vision problems. Summary  Perimenopause is the time when a woman's body begins to move into menopause. This may happen naturally or as a result of other health problems or medical treatments.  Perimenopause can begin 2-8 years before menopause, and it usually lasts for 1 year after menopause.  Perimenopausal symptoms can be managed through medicines, lifestyle changes, and complementary therapies such as acupuncture. This information is not intended to replace advice given to you by your health care provider. Make sure you discuss any questions you have with your health care provider. Document Released:  07/07/2004 Document Revised: 05/12/2017 Document Reviewed: 07/05/2016 Elsevier Patient Education  2020 [employer]

## 2019-02-06 NOTE — Progress Notes (Signed)
Subjective:    Patient ID: Kimberly Rollins, female    DOB: 07-17-1970, 48 y.o.   MRN: [id number]  Kimberly Rollins is a 48 y.o. female presenting on 02/06/2019 for Annual Exam (irregular menstrual )  HPI Annual Physical Exam Patient has been feeling well.  Acute concerns today for irregular menses, possible BV with vaginal irritation, discharge.  Sleeps 6-7 hours per night uninterrupted. Last UTI treated with teledoc about 2 months ago.  HEALTH MAINTENANCE: Weight/BMI: generally stable (typically around 155-158) Physical activity: generally active with work only Diet: Regular Seatbelt: always Sunscreen: rarely PAP: due Mammogram: 01/10/2019 DEXA: n/a Colon Cancer Screen: wait to 50 without family history HIV/HEP C: neg in past  Optometry: every 1-2 years Dentistry: regular  VACCINES: Tetanus: up to date Influenza: recommended Varicella: vaccinated - live attenuated Shingles: recommended at age 66  Irregular menstrual cycle Since February, has period every 2 months.  Patient's last menstrual period was 01/01/2019.   Patient has always been "normal" and predictable every 28-29 days.   - Patient has PMS type symptoms at normal period time without bleeding.  Cramping today, headache today but no bleeding.    Past Medical History:  Diagnosis Date  . Asthma    Seasonal  . Hyperlipidemia   . Hypertension    Past Surgical History:  Procedure Laterality Date  . BREAST REDUCTION SURGERY    . CESAREAN SECTION  1998  . REDUCTION MAMMAPLASTY Bilateral    Social History   Socioeconomic History  . Marital status: Single    Spouse name: Not on file  . Number of children: Not on file  . Years of education: Not on file  . Highest education level: Not on file  Occupational History  . Occupation: [employer]--[employer] home    Employer: [employer]  Social Needs  . Financial resource strain: Not on file  . Food insecurity    Worry: Not on file    Inability: Not on file  .  Transportation needs    Medical: Not on file    Non-medical: Not on file  Tobacco Use  . Smoking status: Current Every Day Smoker    Packs/day: 1.00    Years: 15.00    Pack years: 15.00    Types: Cigarettes  . Smokeless tobacco: Never Used  . Tobacco comment: program at work--pt will try-- trying to cut down  Substance and Sexual Activity  . Alcohol use: Yes    Alcohol/week: 0.0 standard drinks    Comment: Socially  . Drug use: No  . Sexual activity: Yes    Partners: Female    Birth control/protection: Surgical    Comment: partner surgical  Lifestyle  . Physical activity    Days per week: Not on file    Minutes per session: Not on file  . Stress: Not on file  Relationships  . Social Musician on phone: Not on file    Gets together: Not on file    Attends religious service: Not on file    Active member of club or organization: Not on file    Attends meetings of clubs or organizations: Not on file    Relationship status: Not on file  . Intimate partner violence    Fear of current or ex partner: Not on file    Emotionally abused: Not on file    Physically abused: Not on file    Forced sexual activity: Not on file  Other Topics Concern  . Not on  file  Social History Narrative   Exercising-- no   Family History  Problem Relation Age of Onset  . Hypertension Father   . Diabetes Father   . COPD Mother   . Hypertension Brother   . Healthy Daughter   . Breast cancer Neg Hx    Current Outpatient Medications on File Prior to Visit  Medication Sig  . acetaminophen (TYLENOL) 325 MG tablet Take 2 tablets (650 mg total) by mouth every 6 (six) hours as needed for mild pain (or Fever >/= 101).  Marland Kitchen. albuterol (VENTOLIN HFA) 108 (90 Base) MCG/ACT inhaler INHALE 2 PUFFS BY MOUTH INTO THE LUNGS EVERY 6 HOURS AS NEEDED FOR WHEEZING OR SHORTNESS OF BREATH  . amLODipine (NORVASC) 5 MG tablet Take 1 tablet (5 mg total) by mouth daily.  Marland Kitchen. aspirin 81 MG chewable tablet Chew 81  mg by mouth daily.  Marland Kitchen. atorvastatin (LIPITOR) 20 MG tablet Take 1 tablet (20 mg total) by mouth at bedtime.  Marland Kitchen. lisinopril (ZESTRIL) 20 MG tablet Take 1 tablet (20 mg total) by mouth daily.  . Multiple Vitamin (MULTIVITAMIN) tablet Take 1 tablet by mouth daily.   No current facility-administered medications on file prior to visit.     Review of Systems Per HPI unless specifically indicated above    Objective:    BP (!) 133/91 (BP Location: Right Arm, Patient Position: Sitting, Cuff Size: Normal)   Pulse 80   Ht 5' 2.5" (1.588 m)   Wt 165 lb (74.8 kg)   LMP 01/01/2019   BMI 29.70 kg/m   Wt Readings from Last 3 Encounters:  02/06/19 165 lb (74.8 kg)  08/02/18 164 lb 12.8 oz (74.8 kg)  07/22/18 162 lb 14.4 oz (73.9 kg)    Physical Exam Vitals signs and nursing note reviewed.  Constitutional:      General: She is not in acute distress.    Appearance: She is well-developed.  HENT:     Head: Normocephalic and atraumatic.     Right Ear: External ear normal.     Left Ear: External ear normal.     Nose: Nose normal.  Eyes:     Conjunctiva/sclera: Conjunctivae normal.     Pupils: Pupils are equal, round, and reactive to light.  Neck:     Musculoskeletal: Normal range of motion and neck supple.     Thyroid: No thyromegaly.     Vascular: No JVD.     Trachea: No tracheal deviation.  Cardiovascular:     Rate and Rhythm: Normal rate and regular rhythm.     Heart sounds: Normal heart sounds. No murmur. No friction rub. No gallop.   Pulmonary:     Effort: Pulmonary effort is normal. No respiratory distress.     Breath sounds: Normal breath sounds.  Chest:     Comments: Breast - Normal exam w/ symmetric breasts, no mass, no nipple discharge, no skin changes or tenderness.  Abdominal:     General: Bowel sounds are normal. There is no distension.     Palpations: Abdomen is soft.     Tenderness: There is no abdominal tenderness.  Genitourinary:    Comments: Normal external female  genitalia without lesions or fusion. Vaginal canal without lesions. Normal appearing cervix without lesions or friability. Thick, white, clumpy discharge on exam. Bimanual exam without adnexal masses, enlarged uterus, or cervical motion tenderness. Musculoskeletal: Normal range of motion.  Lymphadenopathy:     Cervical: No cervical adenopathy.  Skin:    General: Skin is warm  and dry.     Capillary Refill: Capillary refill takes less than 2 seconds.  Neurological:     General: No focal deficit present.     Mental Status: She is alert and oriented to person, place, and time. Mental status is at baseline.     Cranial Nerves: No cranial nerve deficit.  Psychiatric:        Mood and Affect: Mood normal.        Behavior: Behavior normal.        Thought Content: Thought content normal.        Judgment: Judgment normal.    Results for orders placed or performed in visit on 02/06/19  POCT Urinalysis Dipstick  Result Value Ref Range   Color, UA dark yellow    Clarity, UA negative    Glucose, UA Negative Negative   Bilirubin, UA negative    Ketones, UA negative    Spec Grav, UA <=1.005 (A) 1.010 - 1.025   Blood, UA large    pH, UA 5.0 5.0 - 8.0   Protein, UA Positive (A) Negative   Urobilinogen, UA 0.2 0.2 or 1.0 E.U./dL   Nitrite, UA negative    Leukocytes, UA Large (3+) (A) Negative   Appearance     Odor        Assessment & Plan:   Problem List Items Addressed This Visit    None    Visit Diagnoses    Encounter for annual physical exam    -  Primary   Relevant Orders   CBC with Differential/Platelet   COMPLETE METABOLIC PANEL WITH GFR   Hemoglobin A1c   Lipid panel   TSH + free T4   POCT Urinalysis Dipstick (Completed)   Encounter for Papanicolaou smear for cervical cancer screening       Relevant Orders   Cytology - PAP   Vaginal candidiasis       Relevant Medications   fluconazole (DIFLUCAN) 150 MG tablet   Asymptomatic microscopic hematuria       Relevant Orders    Urinalysis, microscopic only   Perimenopause          Annual physical exam with new findings of vaginal candidiasis, hematuria.  Well adult with concerns of irregular menses - likely perimenopause.  Plan: 1. Obtain health maintenance screenings as above according to age. - Increase physical activity to 30 minutes most days of the week.  - Eat healthy diet high in vegetables and fruits; low in refined carbohydrates. - Screening labs and tests as ordered 2. Educated patient about perimenopause, continue contraception (partner with vasectomy) 3. Return 1 year for annual physical.   Meds ordered this encounter  Medications  . fluconazole (DIFLUCAN) 150 MG tablet    Sig: Take 1 tablet (150 mg total) by mouth once for 1 dose.    Dispense:  1 tablet    Refill:  0    Order Specific Question:   Supervising Provider    Answer:   Olin Hauser [[id number]]   Follow up plan: Return in about 1 year (around 02/06/2020) for annual physical.  Cassell Smiles, DNP, AGPCNP-BC Adult Gerontology Primary Care Nurse Practitioner [facility] 02/06/2019, 9:13 AM

## 2019-02-07 ENCOUNTER — Other Ambulatory Visit: Payer: 59

## 2019-02-07 LAB — URINALYSIS, MICROSCOPIC ONLY
Bacteria, UA: NONE SEEN /HPF
Hyaline Cast: NONE SEEN /LPF

## 2019-02-07 LAB — CYTOLOGY - PAP
Diagnosis: NEGATIVE
HPV: NOT DETECTED

## 2019-02-08 ENCOUNTER — Encounter: Payer: Self-pay | Admitting: Nurse Practitioner

## 2019-02-08 DIAGNOSIS — B373 Candidiasis of vulva and vagina: Secondary | ICD-10-CM

## 2019-02-08 DIAGNOSIS — B3731 Acute candidiasis of vulva and vagina: Secondary | ICD-10-CM

## 2019-02-08 LAB — URINE CULTURE
MICRO NUMBER:: 815386
SPECIMEN QUALITY:: ADEQUATE

## 2019-02-08 LAB — CBC WITH DIFFERENTIAL/PLATELET
Absolute Monocytes: 616 cells/uL (ref 200–950)
Basophils Absolute: 45 cells/uL (ref 0–200)
Basophils Relative: 0.4 %
Eosinophils Absolute: 482 cells/uL (ref 15–500)
Eosinophils Relative: 4.3 %
HCT: 49.2 % — ABNORMAL HIGH (ref 35.0–45.0)
Hemoglobin: 16.5 g/dL — ABNORMAL HIGH (ref 11.7–15.5)
Lymphs Abs: 2632 cells/uL (ref 850–3900)
MCH: 31.5 pg (ref 27.0–33.0)
MCHC: 33.5 g/dL (ref 32.0–36.0)
MCV: 94.1 fL (ref 80.0–100.0)
MPV: 11 fL (ref 7.5–12.5)
Monocytes Relative: 5.5 %
Neutro Abs: 7426 cells/uL (ref 1500–7800)
Neutrophils Relative %: 66.3 %
Platelets: 215 10*3/uL (ref 140–400)
RBC: 5.23 10*6/uL — ABNORMAL HIGH (ref 3.80–5.10)
RDW: 13.1 % (ref 11.0–15.0)
Total Lymphocyte: 23.5 %
WBC: 11.2 10*3/uL — ABNORMAL HIGH (ref 3.8–10.8)

## 2019-02-08 LAB — COMPLETE METABOLIC PANEL WITH GFR
AG Ratio: 1.7 (calc) (ref 1.0–2.5)
ALT: 25 U/L (ref 6–29)
AST: 21 U/L (ref 10–35)
Albumin: 4 g/dL (ref 3.6–5.1)
Alkaline phosphatase (APISO): 114 U/L (ref 31–125)
BUN: 8 mg/dL (ref 7–25)
CO2: 27 mmol/L (ref 20–32)
Calcium: 9.5 mg/dL (ref 8.6–10.2)
Chloride: 108 mmol/L (ref 98–110)
Creat: 0.56 mg/dL (ref 0.50–1.10)
GFR, Est African American: 128 mL/min/{1.73_m2} (ref >=60)
GFR, Est Non African American: 110 mL/min/{1.73_m2} (ref >=60)
Globulin: 2.4 g/dL (calc) (ref 1.9–3.7)
Glucose, Bld: 128 mg/dL — ABNORMAL HIGH (ref 65–99)
Potassium: 4.1 mmol/L (ref 3.5–5.3)
Sodium: 142 mmol/L (ref 135–146)
Total Bilirubin: 0.5 mg/dL (ref 0.2–1.2)
Total Protein: 6.4 g/dL (ref 6.1–8.1)

## 2019-02-08 LAB — LIPID PANEL
Cholesterol: 166 mg/dL (ref <200)
HDL: 39 mg/dL — ABNORMAL LOW (ref >=50)
LDL Cholesterol (Calc): 107 mg/dL (calc) — ABNORMAL HIGH
Non-HDL Cholesterol (Calc): 127 mg/dL (calc) (ref <130)
Total CHOL/HDL Ratio: 4.3 (calc) (ref <5.0)
Triglycerides: 100 mg/dL (ref <150)

## 2019-02-08 LAB — HEMOGLOBIN A1C
Hgb A1c MFr Bld: 5.5 % of total Hgb (ref <5.7)
Mean Plasma Glucose: 111 (calc)
eAG (mmol/L): 6.2 (calc)

## 2019-02-08 LAB — TSH+FREE T4: TSH W/REFLEX TO FT4: 1.71 mIU/L

## 2019-02-08 MED ORDER — CEPHALEXIN 500 MG PO CAPS: 500.0000 mg | ORAL_CAPSULE | Freq: Three times a day (TID) | ORAL | 0 refills | Status: AC

## 2019-02-08 NOTE — Addendum Note (Signed)
Addended by: Cleaster Corin on: 02/08/2019 04:54 PM   Modules accepted: Orders

## 2019-02-11 MED ORDER — FLUCONAZOLE 150 MG PO TABS: 150.0000 mg | ORAL_TABLET | Freq: Once | ORAL | 0 refills | Status: AC

## 2019-03-17 ENCOUNTER — Other Ambulatory Visit: Payer: Self-pay | Admitting: Family Medicine

## 2019-04-25 ENCOUNTER — Other Ambulatory Visit: Payer: Self-pay

## 2019-04-25 ENCOUNTER — Ambulatory Visit (INDEPENDENT_AMBULATORY_CARE_PROVIDER_SITE_OTHER): Payer: 59 | Admitting: Family Medicine

## 2019-04-25 ENCOUNTER — Encounter: Payer: Self-pay | Admitting: Family Medicine

## 2019-04-25 ENCOUNTER — Ambulatory Visit: Payer: Self-pay

## 2019-04-25 VITALS — BP 118/84 | HR 66 | Ht 62.0 in | Wt 162.0 lb

## 2019-04-25 DIAGNOSIS — M65312 Trigger thumb, left thumb: Secondary | ICD-10-CM

## 2019-04-25 DIAGNOSIS — M65341 Trigger finger, right ring finger: Secondary | ICD-10-CM

## 2019-04-25 DIAGNOSIS — M5412 Radiculopathy, cervical region: Secondary | ICD-10-CM | POA: Diagnosis not present

## 2019-04-25 DIAGNOSIS — M65352 Trigger finger, left little finger: Secondary | ICD-10-CM

## 2019-04-25 DIAGNOSIS — M79644 Pain in right finger(s): Secondary | ICD-10-CM | POA: Diagnosis not present

## 2019-04-25 DIAGNOSIS — M65351 Trigger finger, right little finger: Secondary | ICD-10-CM

## 2019-04-25 DIAGNOSIS — M65311 Trigger thumb, right thumb: Secondary | ICD-10-CM

## 2019-04-25 DIAGNOSIS — M65331 Trigger finger, right middle finger: Secondary | ICD-10-CM

## 2019-04-25 DIAGNOSIS — G8929 Other chronic pain: Secondary | ICD-10-CM | POA: Diagnosis not present

## 2019-04-25 DIAGNOSIS — M65321 Trigger finger, right index finger: Secondary | ICD-10-CM

## 2019-04-25 DIAGNOSIS — M65332 Trigger finger, left middle finger: Secondary | ICD-10-CM

## 2019-04-25 DIAGNOSIS — M65342 Trigger finger, left ring finger: Secondary | ICD-10-CM

## 2019-04-25 DIAGNOSIS — M65322 Trigger finger, left index finger: Secondary | ICD-10-CM

## 2019-04-25 MED ORDER — GABAPENTIN 100 MG PO CAPS: 200.0000 mg | ORAL_CAPSULE | Freq: Every day | ORAL | 0 refills | Status: DC

## 2019-04-25 MED ORDER — PREDNISONE 50 MG PO TABS: ORAL_TABLET | ORAL | 0 refills | Status: DC

## 2019-04-25 NOTE — Assessment & Plan Note (Signed)
Cervical radiculopathy.  Discussed with patient that icing regimen and home exercise, which activities to do which wants to avoid.  Patient will increase activity slowly over the course the next several weeks.  Follow-up again in 4 to 8 weeks

## 2019-04-25 NOTE — Progress Notes (Signed)
[facility] [place],  [address] Phone: [phone] Subjective:   [name], am serving as a scribe for Dr. Hulan Saas.   CC: Right thumb pain  F[facility]:[id number]   05/15/2018 Bilateral injections given today.  Tolerated the procedure well.  Discussed bracing at night, topical anti-inflammatories, hand massage and decreasing repetitive activities.  Follow-up again in 4 weeks  Update 04/25/2019 Kimberly Rollins is a 48 y.o. female coming in with complaint of right thumb pain. Had injections in both sides. Has had little pain in the left side but never had relief in right thumb. Thumb is now locking on her.   Is also having shooting pain in right arm coming from her neck. Is sleeping on couch right now due to patient states it can be severe.  Starting to affect her work a little bit.  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     Past Medical History:  Diagnosis Date  . Asthma    Seasonal  . Hyperlipidemia   . Hypertension    Past Surgical History:  Procedure Laterality Date  . BREAST REDUCTION SURGERY    . CESAREAN SECTION  1998  . REDUCTION MAMMAPLASTY Bilateral    Social History   Socioeconomic History  . Marital status: Single    Spouse name: Not on file  . Number of children: Not on file  . Years of education: Not on file  . Highest education level: Not on file  Occupational History  . Occupation: [employer] home    Employer: [employer]  . Financial resource strain: Not on file  . Food insecurity    Worry: Not on file    Inability: Not on file  . Transportation needs    Medical: Not on file    Non-medical: Not on file  Tobacco Use  . Smoking status: Current Every Day Smoker    Packs/day: 1.00    Years: 15.00    Pack years: 15.00    Types: Cigarettes  . Smokeless tobacco: Never Used  . Tobacco comment: program at work--pt will try-- trying to cut  down  Substance and Sexual Activity  . Alcohol use: Yes    Alcohol/week: 0.0 standard drinks    Comment: Socially  . Drug use: No  . Sexual activity: Yes    Partners: Female    Birth control/protection: Surgical    Comment: partner surgical  Lifestyle  . Physical activity    Days per week: Not on file    Minutes per session: Not on file  . Stress: Not on file  Relationships  . Social Herbalist on phone: Not on file    Gets together: Not on file    Attends religious service: Not on file    Active member of club or organization: Not on file    Attends meetings of clubs or organizations: Not on file    Relationship status: Not on file  Other Topics Concern  . Not on file  Social History Narrative   Exercising-- no   Allergies  Allergen Reactions  . Latex Itching and Rash   Family History  Problem Relation Age of Onset  . Hypertension Father   . Diabetes Father   . COPD Mother   . Hypertension Brother   . Healthy Daughter   . Breast cancer Neg Hx     Current Outpatient Medications (Endocrine &  Metabolic):  .  predniSONE (DELTASONE) 50 MG tablet, Take one tablet daily for the next 5 days.  Current Outpatient Medications (Cardiovascular):  .  amLODipine (NORVASC) 5 MG tablet, Take 1 tablet (5 mg total) by mouth daily. Marland Kitchen.  atorvastatin (LIPITOR) 20 MG tablet, Take 1 tablet (20 mg total) by mouth at bedtime. Marland Kitchen.  lisinopril (ZESTRIL) 20 MG tablet, Take 1 tablet (20 mg total) by mouth daily.  Current Outpatient Medications (Respiratory):  .  albuterol (VENTOLIN HFA) 108 (90 Base) MCG/ACT inhaler, INHALE 2 PUFFS BY MOUTH INTO THE LUNGS EVERY 6 HOURS AS NEEDED FOR WHEEZING OR SHORTNESS OF BREATH  Current Outpatient Medications (Analgesics):  .  acetaminophen (TYLENOL) 325 MG tablet, Take 2 tablets (650 mg total) by mouth every 6 (six) hours as needed for mild pain (or Fever >/= 101). Marland Kitchen.  aspirin 81 MG chewable tablet, Chew 81 mg by mouth daily.   Current  Outpatient Medications (Other):  Marland Kitchen.  Multiple Vitamin (MULTIVITAMIN) tablet, Take 1 tablet by mouth daily. Marland Kitchen.  gabapentin (NEURONTIN) 100 MG capsule, Take 2 capsules (200 mg total) by mouth at bedtime.    Past medical history, social, surgical and family history all reviewed in electronic medical record.  No pertanent information unless stated regarding to the chief complaint.   Review of Systems:  No headache, visual changes, nausea, vomiting, diarrhea, constipation, dizziness, abdominal pain, skin rash, fevers, chills, night sweats, weight loss, swollen lymph nodes, body aches, joint swelling,  chest pain, shortness of breath, mood changes.  Positive muscle aches  Objective  Blood pressure 118/84, pulse 66, height 5\' 2"  (1.575 m), weight 162 lb (73.5 kg), SpO2 99 %.    General: No apparent distress alert and oriented x3 mood and affect normal, dressed appropriately.  HEENT: Pupils equal, extraocular movements intact  Respiratory: Patient's speak in full sentences and does not appear short of breath  Cardiovascular: No lower extremity edema, non tender, no erythema  Skin: Warm dry intact with no signs of infection or rash on extremities or on axial skeleton.  Abdomen: Soft nontender  Neuro: Cranial nerves II through XII are intact, neurovascularly intact in all extremities with 2+ DTRs and 2+ pulses.  Lymph: No lymphadenopathy of posterior or anterior cervical chain or axillae bilaterally.  Gait normal with good balance and coordination.  MSK:  Non tender with full range of motion and good stability and symmetric strength and tone of shoulders, elbows, wrist, hip, knee and ankles bilaterally.  Right hand exam shows the patient does have a trigger nodule at the A1 pulley of the thumb noted.  Severely tender to palpation.  Less pain in the [facility] than previous exam.  Patient's wrist mild discomfort over the scaphoid but nothing severe.  Good grip strength noted.  Neck exam  Patient does have a  positive Spurling's with radicular symptoms in the right arm.  Patient is deep tendon reflexes are intact.  Neck does have near full range of motion but lacks the last 10 degrees of right-sided sidebending and left-sided rotation.  Procedure: Real-time Ultrasound Guided Injection of right flexor tendon sheath Device: GE Logiq Q7 Ultrasound guided injection is preferred based studies that show increased duration, increased effect, greater accuracy, decreased procedural pain, increased response rate, and decreased cost with ultrasound guided versus blind injection.  Verbal informed consent obtained.  Time-out conducted.  Noted no overlying erythema, induration, or other signs of local infection.  Skin prepped in a sterile fashion.  Local anesthesia: Topical Ethyl chloride.  With  sterile technique and under real time ultrasound guidance: With a 25-gauge half inch needle injected with 0.5 cc of 0.5% Marcaine and 0.5 cc of Kenalog 40 mg/mL into the tendon sheath Completed without difficulty  Pain immediately resolved suggesting accurate placement of the medication.  Advised to call if fevers/chills, erythema, induration, drainage, or persistent bleeding.  Images permanently stored and available for review in the ultrasound unit.  Impression: Technically successful ultrasound guided injection.  [id number]; 15 additional minutes spent for Therapeutic exercises as stated in above notes.  This included exercises focusing on stretching, strengthening, with significant focus on eccentric aspects.   Long term goals include an improvement in range of motion, strength, endurance as well as avoiding reinjury. Patient's frequency would include in 1-2 times a day, 3-5 times a week for a duration of 6-12 weeks. Exercises that included:  Basic scapular stabilization to include adduction and depression of scapula Scaption, focusing on proper movement and good control Internal and External rotation utilizing a theraband,  with elbow tucked at side entire time Rows with theraband which was given   Proper technique shown and discussed handout in great detail with ATC.  All questions were discussed and answered.      Impression and Recommendations:     This case required medical decision making of moderate complexity. The above documentation has been reviewed and is accurate and complete Judi Saa, DO       Note: This dictation was prepared with Dragon dictation along with smaller phrase technology. Any transcriptional errors that result from this process are unintentional.

## 2019-04-25 NOTE — Patient Instructions (Addendum)
Thumb spice brace at night for 2 weeks Gabapentin at night Prednisone for next 5 days Injected thumb today Exercises for neck 3x a week See me again in 4 weeks

## 2019-04-25 NOTE — Assessment & Plan Note (Signed)
Trigger thumb was injected today.  Discussed icing regimen and home exercise, discussed which activities to do which wants to avoid.  Patient is to increase activity slowly over the course of next several weeks.  Follow-up again 4 weeks.  Start stone spica brace at night.

## 2019-05-28 ENCOUNTER — Ambulatory Visit: Payer: 59 | Admitting: Family Medicine

## 2019-06-25 ENCOUNTER — Other Ambulatory Visit: Payer: Self-pay | Admitting: Family Medicine

## 2019-06-25 DIAGNOSIS — E785 Hyperlipidemia, unspecified: Secondary | ICD-10-CM

## 2019-08-06 ENCOUNTER — Other Ambulatory Visit: Payer: Self-pay | Admitting: Family Medicine

## 2019-11-27 ENCOUNTER — Other Ambulatory Visit: Payer: Self-pay | Admitting: Family Medicine

## 2019-11-27 DIAGNOSIS — Z1231 Encounter for screening mammogram for malignant neoplasm of breast: Secondary | ICD-10-CM

## 2019-11-30 IMAGING — CR DG CHEST 2V
2 series · 2 of 2 positions shown · non-contrast
Comparison: None.

CLINICAL DATA: Shortness of breath.  Asthma.

EXAM:
CHEST - 2 VIEW

[chest pa]
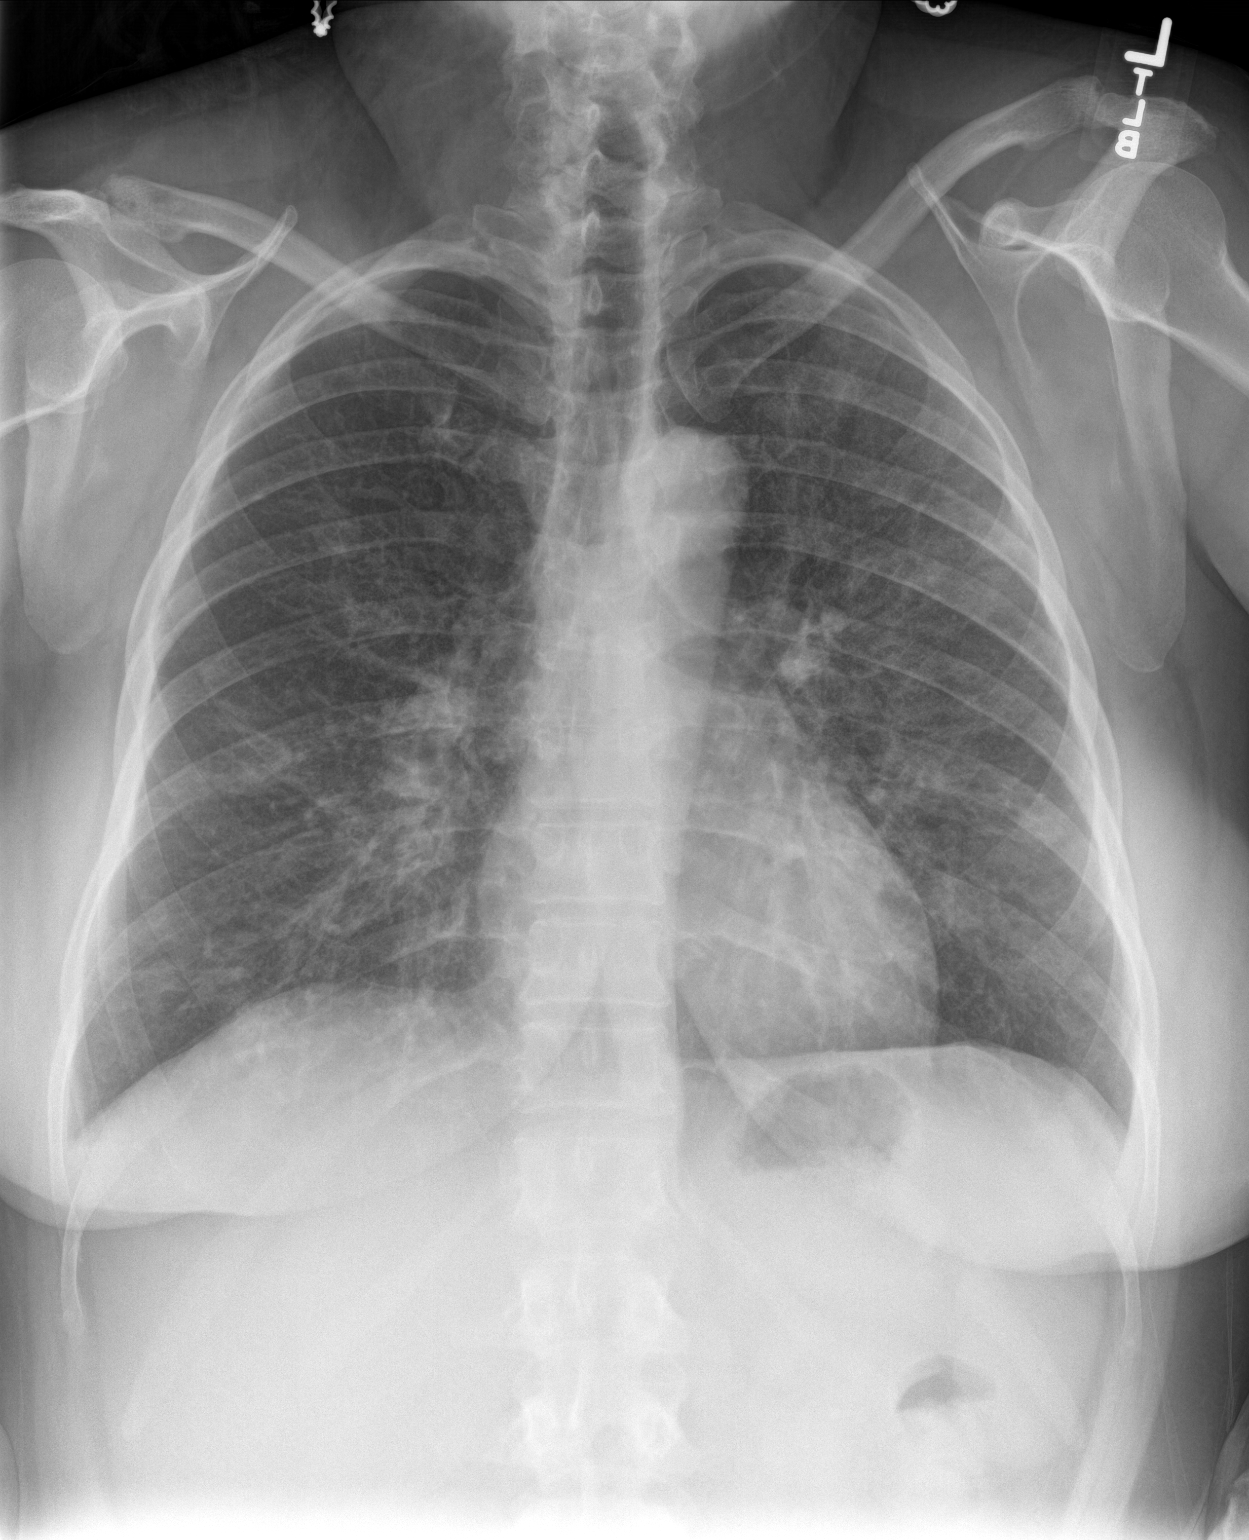

[chest lat]
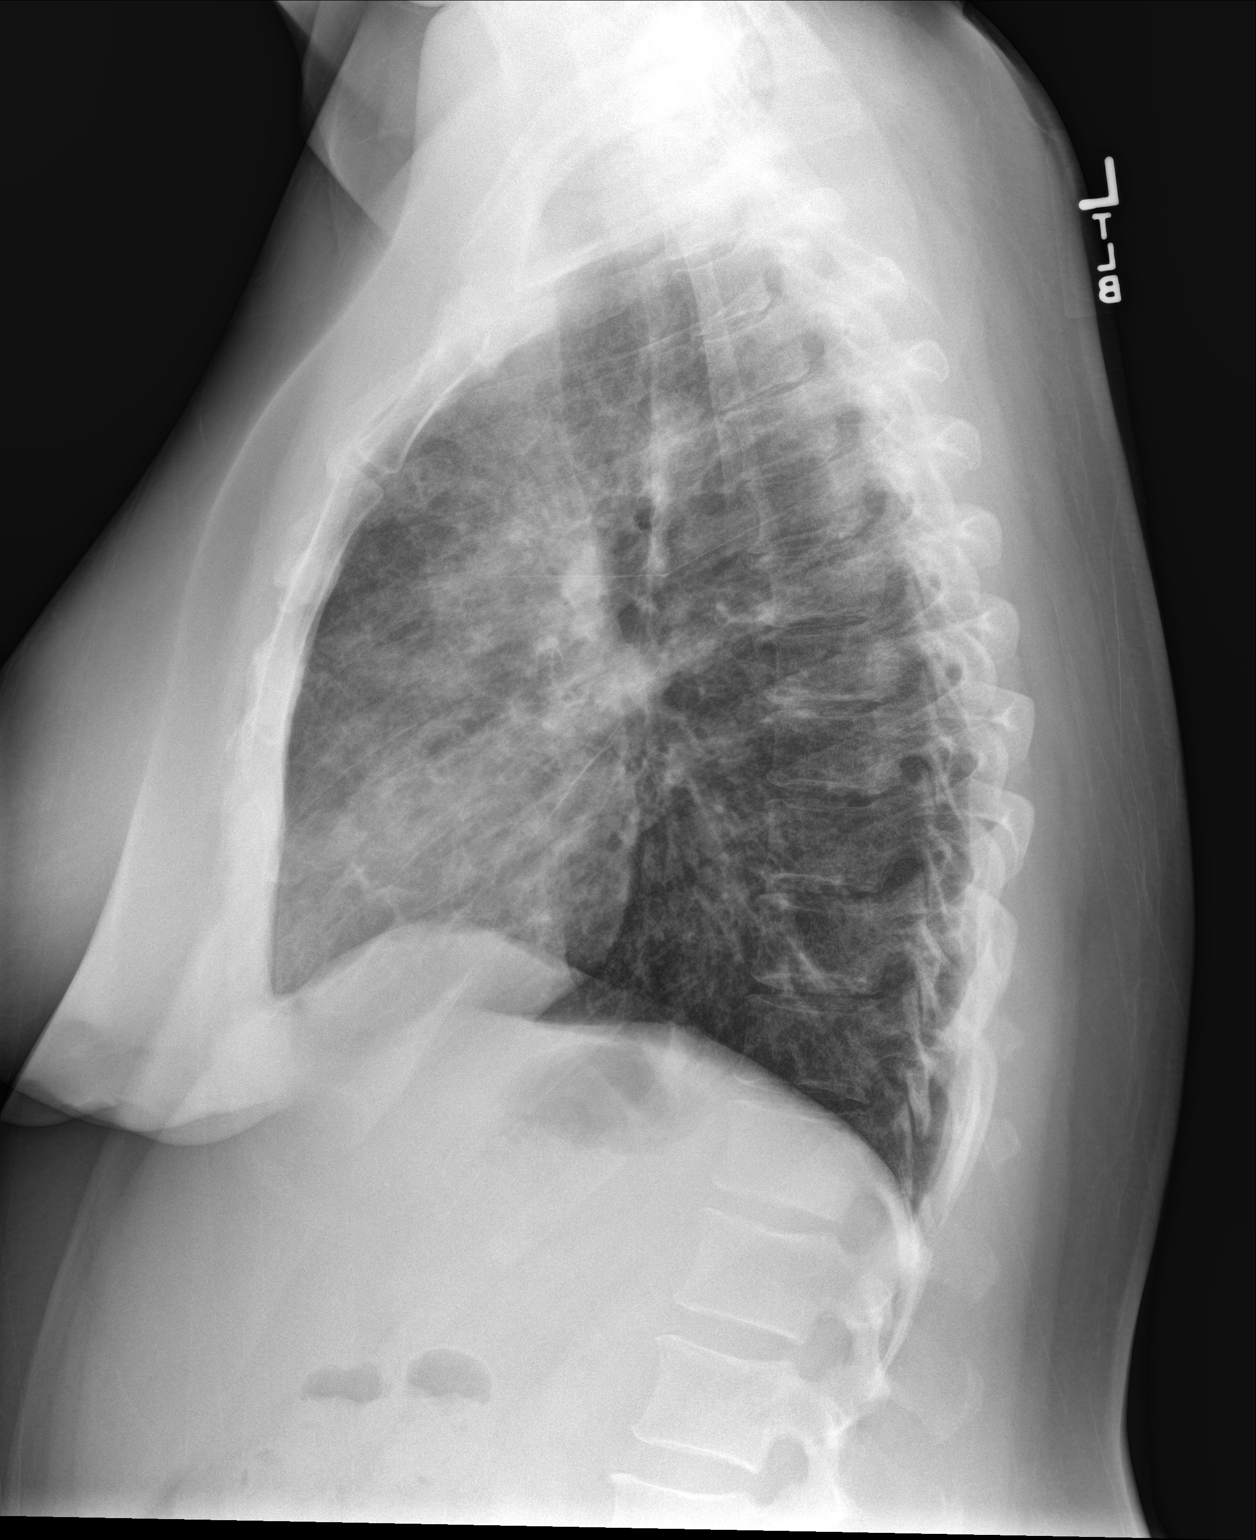

[2 of 2 positions shown; findings below may reference images not displayed]

FINDINGS: Heart size normal. There are multifocal bilateral patchy airspace
densities identified left greater than right. Diffuse bronchial wall
thickening is noted. No pleural effusion or edema. The visualized
osseous structures are unremarkable.
IMPRESSION: 1. Bilateral multifocal patchy airspace densities are noted, left
greater than right. Findings worrisome for multifocal infection.
Followup PA and lateral chest X-ray is recommended in 3-4 weeks
following trial of antibiotic therapy to ensure resolution and
exclude underlying malignancy.
2. Diffuse bronchial wall thickening identified which is compatible
with the history of reactive airways disease and/or bronchitis.

## 2019-12-03 IMAGING — DX DG CHEST 1V PORT
1 series · 1 of 1 positions shown · non-contrast
Comparison: July 17, 2018

CLINICAL DATA: Shortness of breath

EXAM:
PORTABLE CHEST 1 VIEW

[chest ap]
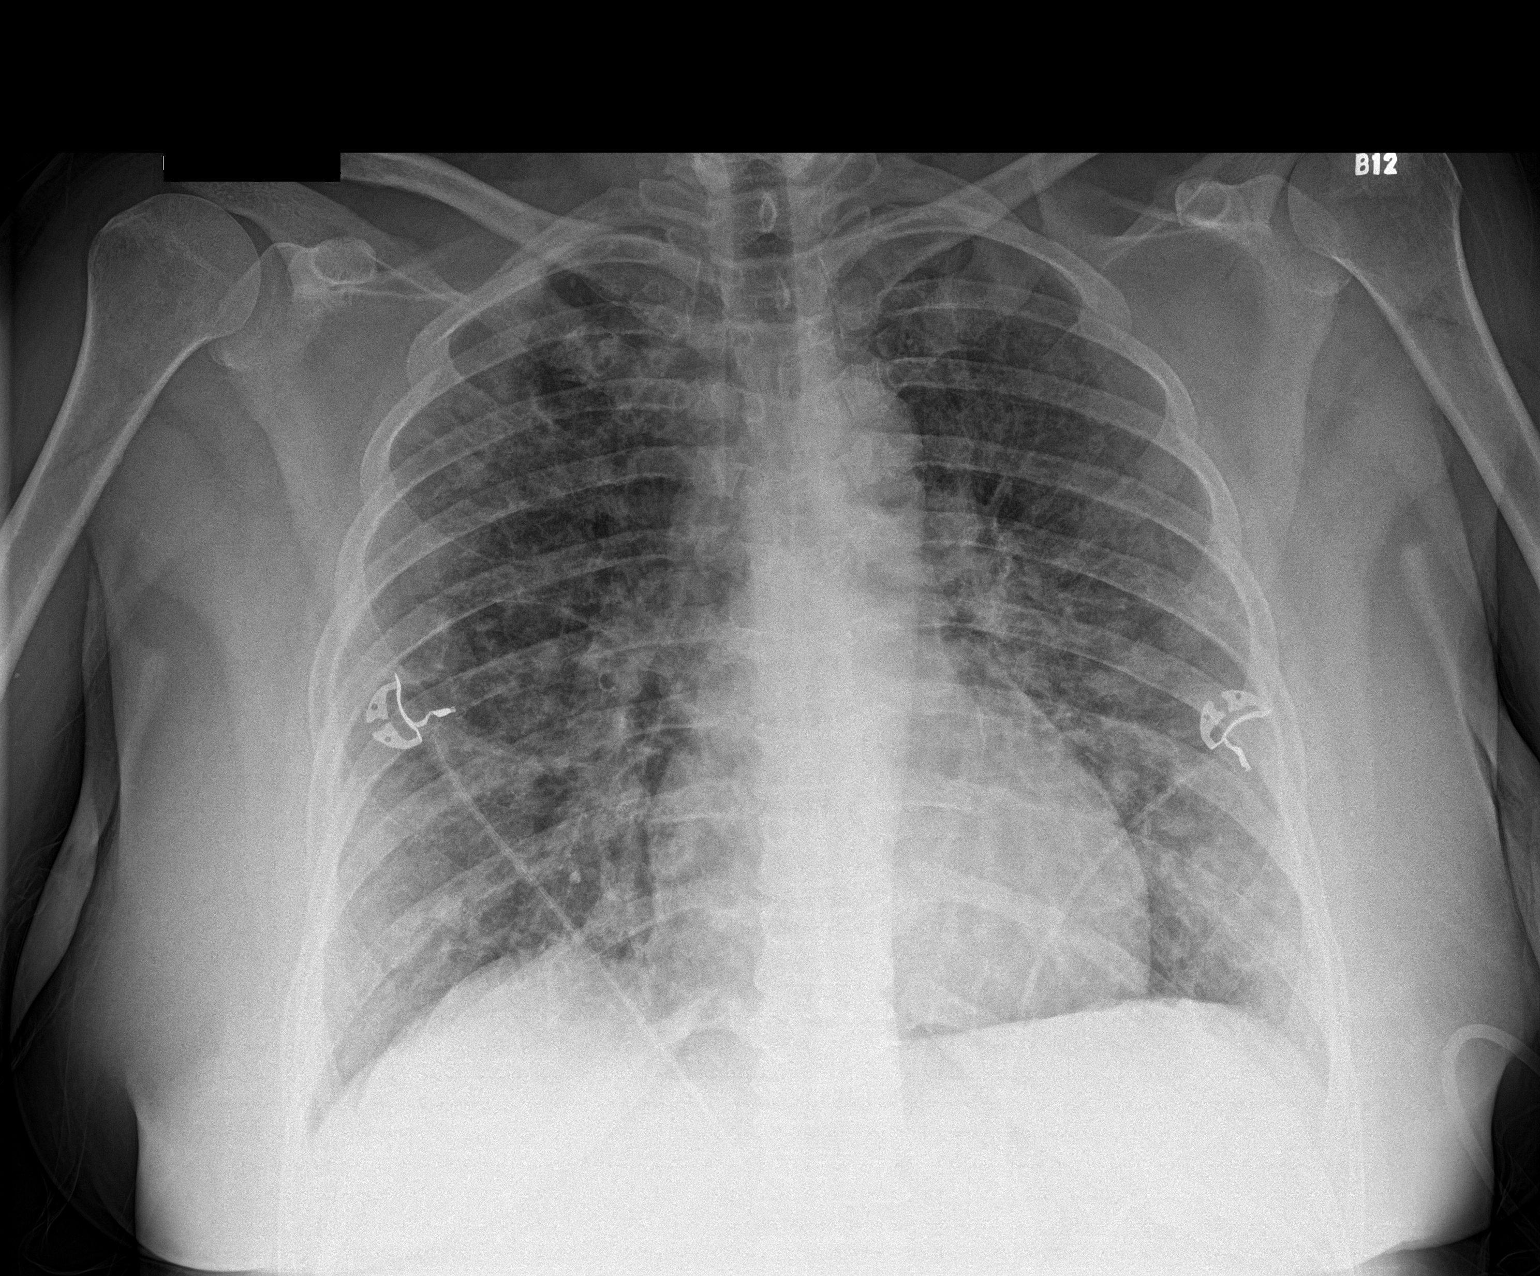

[1 of 1 positions shown; findings below may reference images not displayed]

FINDINGS: There is patchy airspace opacity in the right upper lobe as well as
in both lower lobe regions, felt to represent multifocal pneumonia,
slightly increased from 3 days prior. Heart size and pulmonary
vascularity are normal. No adenopathy. No bone lesions.
IMPRESSION: Progression of patchy infiltrate bilaterally, felt to represent
multifocal pneumonia. No adenopathy. Heart size within normal
limits.

## 2019-12-13 ENCOUNTER — Other Ambulatory Visit: Payer: Self-pay | Admitting: Family Medicine

## 2019-12-22 ENCOUNTER — Other Ambulatory Visit: Payer: Self-pay | Admitting: Family Medicine

## 2019-12-22 DIAGNOSIS — I10 Essential (primary) hypertension: Secondary | ICD-10-CM

## 2019-12-26 DIAGNOSIS — Z03818 Encounter for observation for suspected exposure to other biological agents ruled out: Secondary | ICD-10-CM | POA: Diagnosis not present

## 2019-12-26 DIAGNOSIS — Z20822 Contact with and (suspected) exposure to covid-19: Secondary | ICD-10-CM | POA: Diagnosis not present

## 2020-01-13 ENCOUNTER — Ambulatory Visit
Admission: RE | Admit: 2020-01-13 | Discharge: 2020-01-13 | Disposition: A | Discharge status: Home / Self Care | Payer: BC Managed Care – PPO | Source: Ambulatory Visit | Attending: Family Medicine | Admitting: Family Medicine

## 2020-01-13 ENCOUNTER — Other Ambulatory Visit: Payer: Self-pay

## 2020-01-13 DIAGNOSIS — Z1231 Encounter for screening mammogram for malignant neoplasm of breast: Secondary | ICD-10-CM | POA: Diagnosis not present

## 2020-01-19 ENCOUNTER — Other Ambulatory Visit: Payer: Self-pay | Admitting: Family Medicine

## 2020-01-19 DIAGNOSIS — I10 Essential (primary) hypertension: Secondary | ICD-10-CM

## 2020-01-27 ENCOUNTER — Ambulatory Visit: Payer: BC Managed Care – PPO | Admitting: Family Medicine

## 2020-01-27 ENCOUNTER — Other Ambulatory Visit: Payer: Self-pay

## 2020-01-27 ENCOUNTER — Encounter: Payer: Self-pay | Admitting: Family Medicine

## 2020-01-27 VITALS — BP 110/82 | HR 90 | Temp 98.0°F | Resp 18 | Ht 62.0 in | Wt 154.4 lb

## 2020-01-27 DIAGNOSIS — I10 Essential (primary) hypertension: Secondary | ICD-10-CM

## 2020-01-27 DIAGNOSIS — R319 Hematuria, unspecified: Secondary | ICD-10-CM

## 2020-01-27 DIAGNOSIS — E785 Hyperlipidemia, unspecified: Secondary | ICD-10-CM | POA: Diagnosis not present

## 2020-01-27 DIAGNOSIS — N912 Amenorrhea, unspecified: Secondary | ICD-10-CM | POA: Diagnosis not present

## 2020-01-27 DIAGNOSIS — Z720 Tobacco use: Secondary | ICD-10-CM

## 2020-01-27 DIAGNOSIS — J4520 Mild intermittent asthma, uncomplicated: Secondary | ICD-10-CM | POA: Diagnosis not present

## 2020-01-27 LAB — LIPID PANEL
Cholesterol: 163 mg/dL (ref 0–200)
HDL: 38.7 mg/dL — ABNORMAL LOW (ref >39.00)
LDL Cholesterol: 99 mg/dL (ref 0–99)
NonHDL: 123.9
Total CHOL/HDL Ratio: 4
Triglycerides: 125 mg/dL (ref 0.0–149.0)
VLDL: 25 mg/dL (ref 0.0–40.0)

## 2020-01-27 LAB — POC URINALSYSI DIPSTICK (AUTOMATED)
Bilirubin, UA: NEGATIVE
Glucose, UA: NEGATIVE
Ketones, UA: NEGATIVE
Leukocytes, UA: NEGATIVE
Nitrite, UA: NEGATIVE
Protein, UA: POSITIVE — AB
Spec Grav, UA: 1.015 (ref 1.010–1.025)
Urobilinogen, UA: 0.2 E.U./dL
pH, UA: 5.5 (ref 5.0–8.0)

## 2020-01-27 LAB — CBC WITH DIFFERENTIAL/PLATELET
Basophils Absolute: 0.1 10*3/uL (ref 0.0–0.1)
Basophils Relative: 0.7 % (ref 0.0–3.0)
Eosinophils Absolute: 0.5 10*3/uL (ref 0.0–0.7)
Eosinophils Relative: 4.7 % (ref 0.0–5.0)
HCT: 49.1 % — ABNORMAL HIGH (ref 36.0–46.0)
Hemoglobin: 16.8 g/dL — ABNORMAL HIGH (ref 12.0–15.0)
Lymphocytes Relative: 23.6 % (ref 12.0–46.0)
Lymphs Abs: 2.6 10*3/uL (ref 0.7–4.0)
MCHC: 34.2 g/dL (ref 30.0–36.0)
MCV: 93.5 fl (ref 78.0–100.0)
Monocytes Absolute: 0.4 10*3/uL (ref 0.1–1.0)
Monocytes Relative: 4 % (ref 3.0–12.0)
Neutro Abs: 7.3 10*3/uL (ref 1.4–7.7)
Neutrophils Relative %: 67 % (ref 43.0–77.0)
Platelets: 213 10*3/uL (ref 150.0–400.0)
RBC: 5.25 Mil/uL — ABNORMAL HIGH (ref 3.87–5.11)
RDW: 13.6 % (ref 11.5–15.5)
WBC: 10.9 10*3/uL — ABNORMAL HIGH (ref 4.0–10.5)

## 2020-01-27 LAB — COMPREHENSIVE METABOLIC PANEL
ALT: 27 U/L (ref 0–35)
AST: 19 U/L (ref 0–37)
Albumin: 4.5 g/dL (ref 3.5–5.2)
Alkaline Phosphatase: 138 U/L — ABNORMAL HIGH (ref 39–117)
BUN: 8 mg/dL (ref 6–23)
CO2: 26 mEq/L (ref 19–32)
Calcium: 10.3 mg/dL (ref 8.4–10.5)
Chloride: 108 mEq/L (ref 96–112)
Creatinine, Ser: 0.59 mg/dL (ref 0.40–1.20)
GFR: 130.73 mL/min (ref >60.00)
Glucose, Bld: 138 mg/dL — ABNORMAL HIGH (ref 70–99)
Potassium: 3.6 mEq/L (ref 3.5–5.1)
Sodium: 144 mEq/L (ref 135–145)
Total Bilirubin: 0.5 mg/dL (ref 0.2–1.2)
Total Protein: 7.1 g/dL (ref 6.0–8.3)

## 2020-01-27 LAB — ESTRADIOL: Estradiol: 28 pg/mL

## 2020-01-27 LAB — LUTEINIZING HORMONE: LH: 34.88 m[IU]/mL

## 2020-01-27 LAB — FOLLICLE STIMULATING HORMONE: FSH: 60.7 m[IU]/mL

## 2020-01-27 MED ORDER — LISINOPRIL 20 MG PO TABS: 20.0000 mg | ORAL_TABLET | Freq: Every day | ORAL | 1 refills | Status: DC

## 2020-01-27 MED ORDER — ATORVASTATIN CALCIUM 20 MG PO TABS: ORAL_TABLET | ORAL | 1 refills | Status: DC

## 2020-01-27 MED ORDER — AMLODIPINE BESYLATE 5 MG PO TABS: 5.0000 mg | ORAL_TABLET | Freq: Every day | ORAL | 1 refills | Status: DC

## 2020-01-27 MED ORDER — ALBUTEROL SULFATE HFA 108 (90 BASE) MCG/ACT IN AERS: INHALATION_SPRAY | RESPIRATORY_TRACT | 3 refills | Status: DC

## 2020-01-27 NOTE — Assessment & Plan Note (Signed)
Pt will work on cutting down

## 2020-01-27 NOTE — Assessment & Plan Note (Signed)
Well controlled, no changes to meds. Encouraged heart healthy diet such as the DASH diet and exercise as tolerated.  °

## 2020-01-27 NOTE — Patient Instructions (Signed)
DASH Eating Plan DASH stands for "Dietary Approaches to Stop Hypertension." The DASH eating plan is a healthy eating plan that has been shown to reduce high blood pressure (hypertension). It may also reduce your risk for type 2 diabetes, heart disease, and stroke. The DASH eating plan may also help with weight loss. What are tips for following this plan?  General guidelines  Avoid eating more than 2,300 mg (milligrams) of salt (sodium) a day. If you have hypertension, you may need to reduce your sodium intake to 1,500 mg a day.  Limit alcohol intake to no more than 1 drink a day for nonpregnant women and 2 drinks a day for men. One drink equals 12 oz of beer, 5 oz of wine, or 1 oz of hard liquor.  Work with your health care provider to maintain a healthy body weight or to lose weight. Ask what an ideal weight is for you.  Get at least 30 minutes of exercise that causes your heart to beat faster (aerobic exercise) most days of the week. Activities may include walking, swimming, or biking.  Work with your health care provider or diet and nutrition specialist (dietitian) to adjust your eating plan to your individual calorie needs. Reading food labels   Check food labels for the amount of sodium per serving. Choose foods with less than 5 percent of the Daily Value of sodium. Generally, foods with less than 300 mg of sodium per serving fit into this eating plan.  To find whole grains, look for the word "whole" as the first word in the ingredient list. Shopping  Buy products labeled as "low-sodium" or "no salt added."  Buy fresh foods. Avoid canned foods and premade or frozen meals. Cooking  Avoid adding salt when cooking. Use salt-free seasonings or herbs instead of table salt or sea salt. Check with your health care provider or pharmacist before using salt substitutes.  Do not fry foods. Cook foods using healthy methods such as baking, boiling, grilling, and broiling instead.  Cook with  heart-healthy oils, such as olive, canola, soybean, or sunflower oil. Meal planning  Eat a balanced diet that includes: ? 5 or more servings of fruits and vegetables each day. At each meal, try to fill half of your plate with fruits and vegetables. ? Up to 6-8 servings of whole grains each day. ? Less than 6 oz of lean meat, poultry, or fish each day. A 3-oz serving of meat is about the same size as a deck of cards. One egg equals 1 oz. ? 2 servings of low-fat dairy each day. ? A serving of nuts, seeds, or beans 5 times each week. ? Heart-healthy fats. Healthy fats called Omega-3 fatty acids are found in foods such as flaxseeds and coldwater fish, like sardines, salmon, and mackerel.  Limit how much you eat of the following: ? Canned or prepackaged foods. ? Food that is high in trans fat, such as fried foods. ? Food that is high in saturated fat, such as fatty meat. ? Sweets, desserts, sugary drinks, and other foods with added sugar. ? Full-fat dairy products.  Do not salt foods before eating.  Try to eat at least 2 vegetarian meals each week.  Eat more home-cooked food and less restaurant, buffet, and fast food.  When eating at a restaurant, ask that your food be prepared with less salt or no salt, if possible. What foods are recommended? The items listed may not be a complete list. Talk with your dietitian about   what dietary choices are best for you. Grains Whole-grain or whole-wheat bread. Whole-grain or whole-wheat pasta. Brown rice. Oatmeal. Quinoa. Bulgur. Whole-grain and low-sodium cereals. Pita bread. Low-fat, low-sodium crackers. Whole-wheat flour tortillas. Vegetables Fresh or frozen vegetables (raw, steamed, roasted, or grilled). Low-sodium or reduced-sodium tomato and vegetable juice. Low-sodium or reduced-sodium tomato sauce and tomato paste. Low-sodium or reduced-sodium canned vegetables. Fruits All fresh, dried, or frozen fruit. Canned fruit in natural juice (without  added sugar). Meat and other protein foods Skinless chicken or turkey. Ground chicken or turkey. Pork with fat trimmed off. Fish and seafood. Egg whites. Dried beans, peas, or lentils. Unsalted nuts, nut butters, and seeds. Unsalted canned beans. Lean cuts of beef with fat trimmed off. Low-sodium, lean deli meat. Dairy Low-fat (1%) or fat-free (skim) milk. Fat-free, low-fat, or reduced-fat cheeses. Nonfat, low-sodium ricotta or cottage cheese. Low-fat or nonfat yogurt. Low-fat, low-sodium cheese. Fats and oils Soft margarine without trans fats. Vegetable oil. Low-fat, reduced-fat, or light mayonnaise and salad dressings (reduced-sodium). Canola, safflower, olive, soybean, and sunflower oils. Avocado. Seasoning and other foods Herbs. Spices. Seasoning mixes without salt. Unsalted popcorn and pretzels. Fat-free sweets. What foods are not recommended? The items listed may not be a complete list. Talk with your dietitian about what dietary choices are best for you. Grains Baked goods made with fat, such as croissants, muffins, or some breads. Dry pasta or rice meal packs. Vegetables Creamed or fried vegetables. Vegetables in a cheese sauce. Regular canned vegetables (not low-sodium or reduced-sodium). Regular canned tomato sauce and paste (not low-sodium or reduced-sodium). Regular tomato and vegetable juice (not low-sodium or reduced-sodium). Pickles. Olives. Fruits Canned fruit in a light or heavy syrup. Fried fruit. Fruit in cream or butter sauce. Meat and other protein foods Fatty cuts of meat. Ribs. Fried meat. Bacon. Sausage. Bologna and other processed lunch meats. Salami. Fatback. Hotdogs. Bratwurst. Salted nuts and seeds. Canned beans with added salt. Canned or smoked fish. Whole eggs or egg yolks. Chicken or turkey with skin. Dairy Whole or 2% milk, cream, and half-and-half. Whole or full-fat cream cheese. Whole-fat or sweetened yogurt. Full-fat cheese. Nondairy creamers. Whipped toppings.  Processed cheese and cheese spreads. Fats and oils Butter. Stick margarine. Lard. Shortening. Ghee. Bacon fat. Tropical oils, such as coconut, palm kernel, or palm oil. Seasoning and other foods Salted popcorn and pretzels. Onion salt, garlic salt, seasoned salt, table salt, and sea salt. Worcestershire sauce. Tartar sauce. Barbecue sauce. Teriyaki sauce. Soy sauce, including reduced-sodium. Steak sauce. Canned and packaged gravies. Fish sauce. Oyster sauce. Cocktail sauce. Horseradish that you find on the shelf. Ketchup. Mustard. Meat flavorings and tenderizers. Bouillon cubes. Hot sauce and Tabasco sauce. Premade or packaged marinades. Premade or packaged taco seasonings. Relishes. Regular salad dressings. Where to find more information:  National Heart, Lung, and Blood Institute: www.nhlbi.nih.gov  American Heart Association: www.heart.org Summary  The DASH eating plan is a healthy eating plan that has been shown to reduce high blood pressure (hypertension). It may also reduce your risk for type 2 diabetes, heart disease, and stroke.  With the DASH eating plan, you should limit salt (sodium) intake to 2,300 mg a day. If you have hypertension, you may need to reduce your sodium intake to 1,500 mg a day.  When on the DASH eating plan, aim to eat more fresh fruits and vegetables, whole grains, lean proteins, low-fat dairy, and heart-healthy fats.  Work with your health care provider or diet and nutrition specialist (dietitian) to adjust your eating plan to your   individual calorie needs. This information is not intended to replace advice given to you by your health care provider. Make sure you discuss any questions you have with your health care provider. Document Revised: 05/12/2017 Document Reviewed: 05/23/2016 Elsevier Patient Education  2020 [employer]  

## 2020-01-27 NOTE — Assessment & Plan Note (Signed)
Tolerating statin, encouraged heart healthy diet, avoid trans fats, minimize simple carbs and saturated fats. Increase exercise as tolerated 

## 2020-01-27 NOTE — Progress Notes (Signed)
Patient ID: Kimberly Rollins, female    DOB: Sep 27, 1970  Age: 49 y.o. MRN: [id number]    Subjective:  Subjective  HPI Kimberly Rollins presents for f/u bp and cholesterol   Pt also c/o blood in urine and no periods     She would like labs done  Review of Systems  Constitutional: Negative for appetite change, diaphoresis, fatigue and unexpected weight change.  Eyes: Negative for pain, redness and visual disturbance.  Respiratory: Negative for cough, chest tightness, shortness of breath and wheezing.   Cardiovascular: Negative for chest pain, palpitations and leg swelling.  Endocrine: Negative for cold intolerance, heat intolerance, polydipsia, polyphagia and polyuria.  Genitourinary: Negative for difficulty urinating, dysuria and frequency.  Neurological: Negative for dizziness, light-headedness, numbness and headaches.    History Past Medical History:  Diagnosis Date  . Asthma    Seasonal  . Hyperlipidemia   . Hypertension     She has a past surgical history that includes Cesarean section (1998); Breast reduction surgery; and Reduction mammaplasty (Bilateral).   Her family history includes COPD in her mother; Diabetes in her father; Healthy in her daughter; Hypertension in her brother and father.She reports that she has been smoking cigarettes. She has a 15.00 pack-year smoking history. She has never used smokeless tobacco. She reports current alcohol use. She reports that she does not use drugs.  Current Outpatient Medications on File Prior to Visit  Medication Sig Dispense Refill  . acetaminophen (TYLENOL) 325 MG tablet Take 2 tablets (650 mg total) by mouth every 6 (six) hours as needed for mild pain (or Fever >/= 101). 20 tablet 0  . aspirin 81 MG chewable tablet Chew 81 mg by mouth daily.    . Multiple Vitamin (MULTIVITAMIN) tablet Take 1 tablet by mouth daily.     No current facility-administered medications on file prior to visit.     Objective:  Objective    Physical Exam Vitals and nursing note reviewed.  Constitutional:      Appearance: She is well-developed.  HENT:     Head: Normocephalic and atraumatic.  Eyes:     Conjunctiva/sclera: Conjunctivae normal.  Neck:     Thyroid: No thyromegaly.     Vascular: No carotid bruit or JVD.  Cardiovascular:     Rate and Rhythm: Normal rate and regular rhythm.     Heart sounds: Normal heart sounds. No murmur heard.   Pulmonary:     Effort: Pulmonary effort is normal. No respiratory distress.     Breath sounds: Normal breath sounds. No wheezing or rales.  Chest:     Chest wall: No tenderness.  Musculoskeletal:     Cervical back: Normal range of motion and neck supple.  Neurological:     Mental Status: She is alert and oriented to person, place, and time.    BP 110/82 (BP Location: Right Arm, Patient Position: Sitting, Cuff Size: Normal)   Pulse 90   Temp 98 F (36.7 C) (Oral)   Resp 18   Ht 5\' 2"  (1.575 m)   Wt 154 lb 6.4 oz (70 kg)   LMP 01/12/2020   SpO2 98%   BMI 28.24 kg/m  Wt Readings from Last 3 Encounters:  01/27/20 154 lb 6.4 oz (70 kg)  04/25/19 162 lb (73.5 kg)  02/06/19 165 lb (74.8 kg)     Lab Results  Component Value Date   WBC 11.2 (H) 02/07/2019   HGB 16.5 (H) 02/07/2019   HCT 49.2 (H) 02/07/2019   PLT 215  02/07/2019   GLUCOSE 128 (H) 02/07/2019   CHOL 166 02/07/2019   TRIG 100 02/07/2019   HDL 39 (L) 02/07/2019   LDLDIRECT 149.3 08/02/2013   LDLCALC 107 (H) 02/07/2019   ALT 25 02/07/2019   AST 21 02/07/2019   NA 142 02/07/2019   K 4.1 02/07/2019   CL 108 02/07/2019   CREATININE 0.56 02/07/2019   BUN 8 02/07/2019   CO2 27 02/07/2019   TSH 1.18 01/30/2018   HGBA1C 5.5 02/07/2019   MICROALBUR 2.8 (H) 08/04/2014    MM DIGITAL SCREENING BILATERAL  Result Date: 01/15/2020 CLINICAL DATA:  Screening. EXAM: DIGITAL SCREENING BILATERAL MAMMOGRAM WITH CAD COMPARISON:  Previous exam(s). ACR Breast Density Category b: There are scattered areas of  fibroglandular density. FINDINGS: There are no findings suspicious for malignancy. Images were processed with CAD. IMPRESSION: No mammographic evidence of malignancy. A result letter of this screening mammogram will be mailed directly to the patient. RECOMMENDATION: Screening mammogram in one year. (Code:SM-B-01Y) BI-RADS CATEGORY  1: Negative. Electronically Signed   By: Sherian Rein M.D.   On: 01/15/2020 09:00     Assessment & Plan:  Plan  I have discontinued Kimberly Rollins's gabapentin and predniSONE. I am also having her maintain her aspirin, multivitamin, acetaminophen, amLODipine, atorvastatin, lisinopril, and albuterol.  Meds ordered this encounter  Medications  . amLODipine (NORVASC) 5 MG tablet    Sig: Take 1 tablet (5 mg total) by mouth daily.    Dispense:  90 tablet    Refill:  1  . atorvastatin (LIPITOR) 20 MG tablet    Sig: TAKE 1 TABLET(20 MG) BY MOUTH AT BEDTIME    Dispense:  90 tablet    Refill:  1  . lisinopril (ZESTRIL) 20 MG tablet    Sig: Take 1 tablet (20 mg total) by mouth daily.    Dispense:  90 tablet    Refill:  1  . albuterol (VENTOLIN HFA) 108 (90 Base) MCG/ACT inhaler    Sig: INHALE 2 PUFFS INTO THE LUNGS EVERY 6 HOURS AS NEEDED FOR WHEEZING OR SHORTNESS OF BREATH    Dispense:  8.5 g    Refill:  3    Problem List Items Addressed This Visit      Unprioritized   Asthma in adult    Stable Refill meds       Relevant Medications   albuterol (VENTOLIN HFA) 108 (90 Base) MCG/ACT inhaler   HTN (hypertension)    Well controlled, no changes to meds. Encouraged heart healthy diet such as the DASH diet and exercise as tolerated.       Relevant Medications   amLODipine (NORVASC) 5 MG tablet   atorvastatin (LIPITOR) 20 MG tablet   lisinopril (ZESTRIL) 20 MG tablet   Other Relevant Orders   Lipid panel   Comprehensive metabolic panel   Hyperlipidemia    Tolerating statin, encouraged heart healthy diet, avoid trans fats, minimize simple carbs and  saturated fats. Increase exercise as tolerated      Relevant Medications   amLODipine (NORVASC) 5 MG tablet   atorvastatin (LIPITOR) 20 MG tablet   lisinopril (ZESTRIL) 20 MG tablet   Other Relevant Orders   Lipid panel   Comprehensive metabolic panel   Tobacco abuse    Pt will work on cutting down        Other Visit Diagnoses    Mild intermittent asthma, unspecified whether complicated    -  Primary   Relevant Medications   albuterol (VENTOLIN HFA) 108 (90  Base) MCG/ACT inhaler   Hematuria, unspecified type       Relevant Orders   POCT Urinalysis Dipstick (Automated) (Completed)   Urine Culture   CBC with Differential/Platelet   Amenorrhea       Relevant Orders   FSH   Estradiol   LH      Follow-up: Return if symptoms worsen or fail to improve, for ---------------------.  Donato Schultz, DO

## 2020-01-27 NOTE — Assessment & Plan Note (Signed)
Stable. Refill meds

## 2020-01-29 ENCOUNTER — Other Ambulatory Visit: Payer: Self-pay | Admitting: Family Medicine

## 2020-01-29 DIAGNOSIS — R319 Hematuria, unspecified: Secondary | ICD-10-CM

## 2020-01-29 DIAGNOSIS — R739 Hyperglycemia, unspecified: Secondary | ICD-10-CM

## 2020-01-29 DIAGNOSIS — D509 Iron deficiency anemia, unspecified: Secondary | ICD-10-CM

## 2020-01-29 LAB — URINE CULTURE
MICRO NUMBER:: 10830606
SPECIMEN QUALITY:: ADEQUATE

## 2020-01-30 ENCOUNTER — Other Ambulatory Visit: Payer: Self-pay | Admitting: Family Medicine

## 2020-01-30 ENCOUNTER — Other Ambulatory Visit: Payer: Self-pay

## 2020-01-30 ENCOUNTER — Encounter: Payer: Self-pay | Admitting: Family Medicine

## 2020-01-30 DIAGNOSIS — N760 Acute vaginitis: Secondary | ICD-10-CM

## 2020-01-30 MED ORDER — AMOXICILLIN-POT CLAVULANATE 875-125 MG PO TABS: 1.0000 | ORAL_TABLET | Freq: Two times a day (BID) | ORAL | 0 refills | Status: DC

## 2020-01-30 MED ORDER — FLUCONAZOLE 150 MG PO TABS: ORAL_TABLET | ORAL | 0 refills | Status: DC

## 2020-01-30 NOTE — Telephone Encounter (Signed)
Please advise. You Rx  azithromycin recently.

## 2020-02-07 ENCOUNTER — Ambulatory Visit: Payer: BC Managed Care – PPO | Admitting: Family Medicine

## 2020-02-11 ENCOUNTER — Encounter: Payer: Self-pay | Admitting: Nurse Practitioner

## 2020-04-24 ENCOUNTER — Ambulatory Visit (INDEPENDENT_AMBULATORY_CARE_PROVIDER_SITE_OTHER): Payer: BC Managed Care – PPO | Admitting: Family Medicine

## 2020-04-24 ENCOUNTER — Other Ambulatory Visit (HOSPITAL_COMMUNITY)
Admission: RE | Admit: 2020-04-24 | Discharge: 2020-04-24 | Disposition: A | Discharge status: Home / Self Care | Payer: BC Managed Care – PPO | Source: Ambulatory Visit | Attending: Family Medicine | Admitting: Family Medicine

## 2020-04-24 ENCOUNTER — Encounter: Payer: Self-pay | Admitting: Family Medicine

## 2020-04-24 ENCOUNTER — Other Ambulatory Visit: Payer: Self-pay

## 2020-04-24 VITALS — BP 114/74 | HR 80 | Temp 98.0°F | Resp 18 | Ht 62.0 in | Wt 151.6 lb

## 2020-04-24 DIAGNOSIS — N760 Acute vaginitis: Secondary | ICD-10-CM | POA: Insufficient documentation

## 2020-04-24 DIAGNOSIS — A599 Trichomoniasis, unspecified: Secondary | ICD-10-CM | POA: Diagnosis not present

## 2020-04-24 DIAGNOSIS — Z0000 Encounter for general adult medical examination without abnormal findings: Secondary | ICD-10-CM

## 2020-04-24 DIAGNOSIS — Z0001 Encounter for general adult medical examination with abnormal findings: Secondary | ICD-10-CM | POA: Diagnosis not present

## 2020-04-24 DIAGNOSIS — N950 Postmenopausal bleeding: Secondary | ICD-10-CM

## 2020-04-24 DIAGNOSIS — Z1159 Encounter for screening for other viral diseases: Secondary | ICD-10-CM

## 2020-04-24 DIAGNOSIS — R3129 Other microscopic hematuria: Secondary | ICD-10-CM

## 2020-04-24 LAB — LIPID PANEL
Cholesterol: 154 mg/dL (ref 0–200)
HDL: 35.6 mg/dL — ABNORMAL LOW (ref >39.00)
LDL Cholesterol: 103 mg/dL — ABNORMAL HIGH (ref 0–99)
NonHDL: 118.81
Total CHOL/HDL Ratio: 4
Triglycerides: 78 mg/dL (ref 0.0–149.0)
VLDL: 15.6 mg/dL (ref 0.0–40.0)

## 2020-04-24 LAB — POC URINALSYSI DIPSTICK (AUTOMATED)
Bilirubin, UA: NEGATIVE
Glucose, UA: NEGATIVE
Ketones, UA: NEGATIVE
Nitrite, UA: NEGATIVE
Protein, UA: POSITIVE — AB
Spec Grav, UA: 1.02 (ref 1.010–1.025)
Urobilinogen, UA: 0.2 E.U./dL
pH, UA: 5.5 (ref 5.0–8.0)

## 2020-04-24 LAB — COMPREHENSIVE METABOLIC PANEL
ALT: 19 U/L (ref 0–35)
AST: 15 U/L (ref 0–37)
Albumin: 4.2 g/dL (ref 3.5–5.2)
Alkaline Phosphatase: 130 U/L — ABNORMAL HIGH (ref 39–117)
BUN: 10 mg/dL (ref 6–23)
CO2: 29 mEq/L (ref 19–32)
Calcium: 9.7 mg/dL (ref 8.4–10.5)
Chloride: 106 mEq/L (ref 96–112)
Creatinine, Ser: 0.6 mg/dL (ref 0.40–1.20)
GFR: 105.04 mL/min (ref >60.00)
Glucose, Bld: 103 mg/dL — ABNORMAL HIGH (ref 70–99)
Potassium: 3.6 mEq/L (ref 3.5–5.1)
Sodium: 142 mEq/L (ref 135–145)
Total Bilirubin: 0.6 mg/dL (ref 0.2–1.2)
Total Protein: 6.8 g/dL (ref 6.0–8.3)

## 2020-04-24 LAB — CBC WITH DIFFERENTIAL/PLATELET
Basophils Absolute: 0.1 10*3/uL (ref 0.0–0.1)
Basophils Relative: 0.7 % (ref 0.0–3.0)
Eosinophils Absolute: 0.3 10*3/uL (ref 0.0–0.7)
Eosinophils Relative: 3.3 % (ref 0.0–5.0)
HCT: 47.6 % — ABNORMAL HIGH (ref 36.0–46.0)
Hemoglobin: 16.2 g/dL — ABNORMAL HIGH (ref 12.0–15.0)
Lymphocytes Relative: 23.8 % (ref 12.0–46.0)
Lymphs Abs: 2.1 10*3/uL (ref 0.7–4.0)
MCHC: 34 g/dL (ref 30.0–36.0)
MCV: 92 fl (ref 78.0–100.0)
Monocytes Absolute: 0.5 10*3/uL (ref 0.1–1.0)
Monocytes Relative: 5 % (ref 3.0–12.0)
Neutro Abs: 6 10*3/uL (ref 1.4–7.7)
Neutrophils Relative %: 67.2 % (ref 43.0–77.0)
Platelets: 215 10*3/uL (ref 150.0–400.0)
RBC: 5.18 Mil/uL — ABNORMAL HIGH (ref 3.87–5.11)
RDW: 13.4 % (ref 11.5–15.5)
WBC: 9 10*3/uL (ref 4.0–10.5)

## 2020-04-24 LAB — TSH: TSH: 1.1 u[IU]/mL (ref 0.35–4.50)

## 2020-04-24 NOTE — Patient Instructions (Signed)

## 2020-04-24 NOTE — Progress Notes (Signed)
Subjective:     Kimberly Rollins is a 49 y.o. female and is here for a comprehensive physical exam. The patient reports con't bleeding vaginally   Social History   Socioeconomic History  . Marital status: Single    Spouse name: Not on file  . Number of children: Not on file  . Years of education: Not on file  . Highest education level: Not on file  Occupational History  . Occupation: [employer] home    Employer: [employer]  Tobacco Use  . Smoking status: Current Every Day Smoker    Packs/day: 1.00    Years: 15.00    Pack years: 15.00    Types: Cigarettes  . Smokeless tobacco: Never Used  . Tobacco comment: program at work--pt will try-- trying to cut down  Vaping Use  . Vaping Use: Never used  Substance and Sexual Activity  . Alcohol use: Yes    Alcohol/week: 0.0 standard drinks    Comment: Socially  . Drug use: No  . Sexual activity: Yes    Partners: Female    Birth control/protection: Surgical    Comment: partner surgical  Other Topics Concern  . Not on file  Social History Narrative   Exercising-- no   Social Determinants of Health   Financial Resource Strain:   . Difficulty of Paying Living Expenses: Not on file  Food Insecurity:   . Worried About Programme researcher, broadcasting/film/video in the Last Year: Not on file  . Ran Out of Food in the Last Year: Not on file  Transportation Needs:   . Lack of Transportation (Medical): Not on file  . Lack of Transportation (Non-Medical): Not on file  Physical Activity:   . Days of Exercise per Week: Not on file  . Minutes of Exercise per Session: Not on file  Stress:   . Feeling of Stress : Not on file  Social Connections:   . Frequency of Communication with Friends and Family: Not on file  . Frequency of Social Gatherings with Friends and Family: Not on file  . Attends Religious Services: Not on file  . Active Member of Clubs or Organizations: Not on file  . Attends Banker Meetings: Not on file  . Marital  Status: Not on file  Intimate Partner Violence:   . Fear of Current or Ex-Partner: Not on file  . Emotionally Abused: Not on file  . Physically Abused: Not on file  . Sexually Abused: Not on file   Health Maintenance  Topic Date Due  . Hepatitis C Screening  Never done  . INFLUENZA VACCINE  01/12/2020  . MAMMOGRAM  01/12/2021  . PAP SMEAR-Modifier  02/05/2022  . TETANUS/TDAP  01/31/2028  . COVID-19 Vaccine  Completed  . HIV Screening  Completed    The following portions of the patient's history were reviewed and updated as appropriate:  She  has a past medical history of Asthma, Hyperlipidemia, and Hypertension. She does not have any pertinent problems on file. She  has a past surgical history that includes Cesarean section (1998); Breast reduction surgery; and Reduction mammaplasty (Bilateral). Her family history includes COPD in her mother; Diabetes in her father; Healthy in her daughter; Hypertension in her brother and father. She  reports that she has been smoking cigarettes. She has a 15.00 pack-year smoking history. She has never used smokeless tobacco. She reports current alcohol use. She reports that she does not use drugs. She has a current medication list which includes the following  prescription(s): acetaminophen, albuterol, amlodipine, aspirin, atorvastatin, lisinopril, and multivitamin. Current Outpatient Medications on File Prior to Visit  Medication Sig Dispense Refill  . acetaminophen (TYLENOL) 325 MG tablet Take 2 tablets (650 mg total) by mouth every 6 (six) hours as needed for mild pain (or Fever >/= 101). 20 tablet 0  . albuterol (VENTOLIN HFA) 108 (90 Base) MCG/ACT inhaler INHALE 2 PUFFS INTO THE LUNGS EVERY 6 HOURS AS NEEDED FOR WHEEZING OR SHORTNESS OF BREATH 8.5 g 3  . amLODipine (NORVASC) 5 MG tablet Take 1 tablet (5 mg total) by mouth daily. 90 tablet 1  . aspirin 81 MG chewable tablet Chew 81 mg by mouth daily.    Marland Kitchen atorvastatin (LIPITOR) 20 MG tablet TAKE 1  TABLET(20 MG) BY MOUTH AT BEDTIME 90 tablet 1  . lisinopril (ZESTRIL) 20 MG tablet Take 1 tablet (20 mg total) by mouth daily. 90 tablet 1  . Multiple Vitamin (MULTIVITAMIN) tablet Take 1 tablet by mouth daily.     No current facility-administered medications on file prior to visit.   She is allergic to latex..  Review of Systems Review of Systems  Constitutional: Negative for activity change, appetite change and fatigue.  HENT: Negative for hearing loss, congestion, tinnitus and ear discharge.  dentist q4m Eyes: Negative for visual disturbance (see optho q1y -- vision corrected to 20/20 with glasses).  Respiratory: Negative for cough, chest tightness and shortness of breath.   Cardiovascular: Negative for chest pain, palpitations and leg swelling.  Gastrointestinal: Negative for abdominal pain, diarrhea, constipation and abdominal distention.  Genitourinary: Negative for urgency, frequency, decreased urine volume and difficulty urinating.  Musculoskeletal: Negative for back pain, arthralgias and gait problem.  Skin: Negative for color change, pallor and rash.  Neurological: Negative for dizziness, light-headedness, numbness and headaches.  Hematological: Negative for adenopathy. Does not bruise/bleed easily.  Psychiatric/Behavioral: Negative for suicidal ideas, confusion, sleep disturbance, self-injury, dysphoric mood, decreased concentration and agitation.       Objective:    BP 114/74 (BP Location: Right Arm, Patient Position: Sitting, Cuff Size: Normal)   Pulse 80   Temp 98 F (36.7 C) (Oral)   Resp 18   Ht 5\' 2"  (1.575 m)   Wt 151 lb 9.6 oz (68.8 kg)   SpO2 99%   BMI 27.73 kg/m  General appearance: alert, cooperative, appears stated age and no distress Head: Normocephalic, without obvious abnormality, atraumatic Eyes: negative findings: lids and lashes normal and conjunctivae and sclerae normal Ears: normal TM's and external ear canals both ears Neck: no adenopathy, no  carotid bruit, no JVD, supple, symmetrical, trachea midline and thyroid not enlarged, symmetric, no tenderness/mass/nodules Back: symmetric, no curvature. ROM normal. No CVA tenderness. Lungs: clear to auscultation bilaterally Breasts: normal appearance, no masses or tenderness Heart: regular rate and rhythm, S1, S2 normal, no murmur, click, rub or gallop Abdomen: soft, non-tender; bowel sounds normal; no masses,  no organomegaly Pelvic: gyn Extremities: extremities normal, atraumatic, no cyanosis or edema Pulses: 2+ and symmetric Skin: Skin color, texture, turgor normal. No rashes or lesions Lymph nodes: Cervical, supraclavicular, and axillary nodes normal. Neurologic: Alert and oriented X 3, normal strength and tone. Normal symmetric reflexes. Normal coordination and gait     Office Visit on 01/27/2020  Component Date Value Ref Range Status  . Color, UA 01/27/2020 yellow   Final  . Clarity, UA 01/27/2020 cloudy   Final  . Glucose, UA 01/27/2020 Negative  Negative Final  . Bilirubin, UA 01/27/2020 negative   Final  .  Ketones, UA 01/27/2020 negative   Final  . Spec Grav, UA 01/27/2020 1.015  1.010 - 1.025 Final  . Blood, UA 01/27/2020 large   Final  . pH, UA 01/27/2020 5.5  5.0 - 8.0 Final  . Protein, UA 01/27/2020 Positive* Negative Final  . Urobilinogen, UA 01/27/2020 0.2  0.2 or 1.0 E.U./dL Final  . Nitrite, UA 16/10/960408/16/2021 negative   Final  . Leukocytes, UA 01/27/2020 Negative  Negative Final  . [facility] 01/27/2020 60.7  mIU/ML Final   Female Reference Range:  1.4-18.1 mIU/mLFemale Reference Range:Follicular Phase          2.5-10.2 mIU/mLMidCycle Peak          3.4-33.4 mIU/mLLuteal Phase          1.5-9.1 mIU/mLPost Menopausal     23.0-116.3 mIU/mLPregnant          <0.3 mIU/mL  . Estradiol 01/27/2020 28  pg/mL Final   Comment:       Reference Range         Follicular Phase:    19-144         Mid-Cycle:           64-357         Luteal Phase:        [id number]         Postmenopausal:      < or  = 31 . Reference range established on post-pubertal patient population. No pre-pubertal reference range established using this assay. For any patients for whom low Estradiol levels are anticipated (e.g. males, pre-pubertal children and hypogonadal/post-menopausal  females), the [employer] [facility] [employer] Estradiol, Ultrasensitive, LCMSMS assay is recommended (order code [id number]). . Please note: patients being treated with the drug  fulvestrant (Faslodex(R)) have demonstrated significant  interference in immunoassay methods for estradiol  measurement. The cross reactivity could lead to falsely  elevated estradiol test results leading to an  inappropriate clinical assessment of estrogen status. [facility] order code 30289-Estradiol,  Ultrasensitive LC/MS/MS demonstrates negligible cross  re                          activity with fulvestrant.   . LH 01/27/2020 34.88  mIU/mL Final   Comment: Female Reference Range:20-70 yrs     1.5-9.3 mIU/mL>70 yrs       3.1-35.6 mIU/mLFemale Reference Range:Follicular Phase     1.9-12.5 mIU/mLMidcycle             8.7-76.3 mIU/mLLuteal Phase         0.5-16.9 mIU/mL  Post Menopausal      15.9-54.0  mIU/mLPregnant             <1.5 mIU/mLContraceptives       0.7-5.6 mIU/mL   . Sodium 01/27/2020 144  135 - 145 mEq/L Final  . Potassium 01/27/2020 3.6  3.5 - 5.1 mEq/L Final  . Chloride 01/27/2020 108  96 - 112 mEq/L Final  . CO2 01/27/2020 26  19 - 32 mEq/L Final  . Glucose, Bld 01/27/2020 138* 70 - 99 mg/dL Final  . BUN 78/29/562108/16/2021 8  6 - 23 mg/dL Final  . Creatinine, Ser 01/27/2020 0.59  0.40 - 1.20 mg/dL Final  . Total Bilirubin 01/27/2020 0.5  0.2 - 1.2 mg/dL Final  . Alkaline Phosphatase 01/27/2020 138* 39 - 117 U/L Final  . AST 01/27/2020 19  0 - 37 U/L Final  . ALT 01/27/2020 27  0 - 35 U/L Final  . Total Protein  01/27/2020 7.1  6.0 - 8.3 g/dL Final  . Albumin 16/03/9603 4.5  3.5 - 5.2 g/dL Final  . GFR 54/02/8118 130.73  >60.00 mL/min  Final  . Calcium 01/27/2020 10.3  8.4 - 10.5 mg/dL Final  . Cholesterol 14/78/2956 163  0 - 200 mg/dL Final   ATP III Classification       Desirable:  < 200 mg/dL               Borderline High:  200 - 239 mg/dL          High:  > = 213 mg/dL  . Triglycerides 01/27/2020 125.0  0 - 149 mg/dL Final   Normal:  <086 mg/dLBorderline High:  150 - 199 mg/dL  . HDL 01/27/2020 38.70* >39.00 mg/dL Final  . VLDL 57/84/6962 25.0  0.0 - 40.0 mg/dL Final  . LDL Cholesterol 01/27/2020 99  0 - 99 mg/dL Final  . Total CHOL/HDL Ratio 01/27/2020 4   Final                  Men          Women1/2 Average Risk     3.4          3.3Average Risk          5.0          4.42X Average Risk          9.6          7.13X Average Risk          15.0          11.0                      . NonHDL 01/27/2020 123.90   Final   NOTE:  Non-HDL goal should be 30 mg/dL higher than patient's LDL goal (i.e. LDL goal of < 70 mg/dL, would have non-HDL goal of < 100 mg/dL)  . MICRO NUMBER: 01/27/2020 [id number]   Final  . SPECIMEN QUALITY: 01/27/2020 Adequate   Final  . Sample Source 01/27/2020 NOT GIVEN   Final  . STATUS: 01/27/2020 FINAL   Final  . ISOLATE 1: 01/27/2020 Proteus mirabilis*  Final   10,000-49,000 CFU/mL of Proteus mirabilis  . WBC 01/27/2020 10.9* 4.0 - 10.5 K/uL Final  . RBC 01/27/2020 5.25* 3.87 - 5.11 Mil/uL Final  . Hemoglobin 01/27/2020 16.8* 12.0 - 15.0 g/dL Final  . HCT 44/06/270 49.1* 36 - 46 % Final  . MCV 01/27/2020 93.5  78.0 - 100.0 fl Final  . MCHC 01/27/2020 34.2  30.0 - 36.0 g/dL Final  . RDW 53/66/4403 13.6  11.5 - 15.5 % Final  . Platelets 01/27/2020 213.0  150 - 400 K/uL Final  . Neutrophils Relative % 01/27/2020 67.0  43 - 77 % Final  . Lymphocytes Relative 01/27/2020 23.6  12 - 46 % Final  . Monocytes Relative 01/27/2020 4.0  3 - 12 % Final  . Eosinophils Relative 01/27/2020 4.7  0 - 5 % Final  . Basophils Relative 01/27/2020 0.7  0 - 3 % Final  . Neutro Abs 01/27/2020 7.3  1.4 - 7.7 K/uL Final  .  Lymphs Abs 01/27/2020 2.6  0.7 - 4.0 K/uL Final  . Monocytes Absolute 01/27/2020 0.4  0.1 - 1.0 K/uL Final  . Eosinophils Absolute 01/27/2020 0.5  0.0 - 0.7 K/uL Final  . Basophils Absolute 01/27/2020 0.1  0.0 - 0.1 K/uL Final   Assessment:    Healthy  female exam.      Plan:    ghm utd Check labs  See After Visit Summary for Counseling Recommendations    1. Postmenopausal bleeding Bleeding con't  Will refer to gyn - CBC with Differential/Platelet - Ambulatory referral to Obstetrics / Gynecology  2. Preventative health care See above  - Lipid panel - CBC with Differential/Platelet - TSH - Comprehensive metabolic panel  3. Need for hepatitis C screening test  - Hepatitis C antibody  4. Acute vaginitis Self swab  Check urine  - POCT Urinalysis Dipstick (Automated) - Cervicovaginal ancillary only( [place])  5. Microscopic hematuria   - Urine Culture

## 2020-04-27 ENCOUNTER — Encounter: Payer: Self-pay | Admitting: Family Medicine

## 2020-04-27 ENCOUNTER — Other Ambulatory Visit: Payer: Self-pay

## 2020-04-27 LAB — CERVICOVAGINAL ANCILLARY ONLY
Bacterial Vaginitis (gardnerella): NEGATIVE
Candida Glabrata: NEGATIVE
Candida Vaginitis: NEGATIVE
Chlamydia: NEGATIVE
Comment: NEGATIVE
Comment: NEGATIVE
Comment: NEGATIVE
Comment: NEGATIVE
Comment: NEGATIVE
Comment: NORMAL
Neisseria Gonorrhea: NEGATIVE
Trichomonas: POSITIVE — AB

## 2020-04-27 LAB — HEPATITIS C ANTIBODY
Hepatitis C Ab: NONREACTIVE
SIGNAL TO CUT-OFF: 0.01 (ref <1.00)

## 2020-04-27 LAB — URINE CULTURE
MICRO NUMBER:: 11196950
SPECIMEN QUALITY:: ADEQUATE

## 2020-04-27 MED ORDER — AMOXICILLIN-POT CLAVULANATE 500-125 MG PO TABS: 1.0000 | ORAL_TABLET | Freq: Two times a day (BID) | ORAL | 0 refills | Status: DC

## 2020-04-28 NOTE — Telephone Encounter (Signed)
She needs flagyl 500 mg bid x 7 days for trich

## 2020-04-29 MED ORDER — METRONIDAZOLE 500 MG PO TABS: 500.0000 mg | ORAL_TABLET | Freq: Two times a day (BID) | ORAL | 0 refills | Status: AC

## 2020-05-04 ENCOUNTER — Other Ambulatory Visit: Payer: Self-pay | Admitting: Family Medicine

## 2020-05-04 DIAGNOSIS — N760 Acute vaginitis: Secondary | ICD-10-CM

## 2020-05-04 MED ORDER — FLUCONAZOLE 150 MG PO TABS: ORAL_TABLET | ORAL | 0 refills | Status: DC

## 2020-05-19 DIAGNOSIS — Z20822 Contact with and (suspected) exposure to covid-19: Secondary | ICD-10-CM | POA: Diagnosis not present

## 2020-06-17 DIAGNOSIS — Z1159 Encounter for screening for other viral diseases: Secondary | ICD-10-CM | POA: Diagnosis not present

## 2020-06-25 DIAGNOSIS — Z20822 Contact with and (suspected) exposure to covid-19: Secondary | ICD-10-CM | POA: Diagnosis not present

## 2020-10-16 ENCOUNTER — Encounter: Payer: Self-pay | Admitting: Family Medicine

## 2020-10-16 DIAGNOSIS — J4520 Mild intermittent asthma, uncomplicated: Secondary | ICD-10-CM

## 2020-10-16 MED ORDER — ALBUTEROL SULFATE HFA 108 (90 BASE) MCG/ACT IN AERS: 2.0000 | INHALATION_SPRAY | Freq: Four times a day (QID) | RESPIRATORY_TRACT | 5 refills | Status: DC | PRN

## 2020-10-22 ENCOUNTER — Ambulatory Visit: Payer: BC Managed Care – PPO | Admitting: Family Medicine

## 2020-11-12 ENCOUNTER — Ambulatory Visit: Payer: BC Managed Care – PPO | Admitting: Family Medicine

## 2020-11-12 ENCOUNTER — Telehealth: Payer: Self-pay | Admitting: *Deleted

## 2020-11-12 NOTE — Telephone Encounter (Signed)
Appointment cancelled.  Called to see if patient wanted to reschedule but she stated that she will call back.

## 2020-11-12 NOTE — Telephone Encounter (Signed)
Caller Name Akhila Mahnken Caller Phone Number [phone] Patient Name Kimberly Rollins Patient DOB May 25, 1971 Call Type Message Only Information Provided Reason for Call Request to [facility] Appointment Initial Comment Caller has an appt. tomorrow morning and will not be able to make it. Patient request to speak to RN No Additional Comment Nurse triage was offered and caller declined. Provided caller with office hours. Disp. Time Disposition Final User 11/11/2020 8:18:54 PM General Information Provided Yes Monyjang, Rudi

## 2020-11-12 NOTE — Progress Notes (Incomplete)
Subjective:   By signing my name below, I, [name], attest that this documentation has been prepared under the direction and in the presence of Dr. Seabron Spates, DO. 11/12/2020      Patient ID: Kimberly Rollins, female    DOB: 01-10-1971, 50 y.o.   MRN: [id number]  No chief complaint on file.   HPI Patient is in today for a office visit.   Past Medical History:  Diagnosis Date  . Asthma    Seasonal  . Hyperlipidemia   . Hypertension     Past Surgical History:  Procedure Laterality Date  . BREAST REDUCTION SURGERY    . CESAREAN SECTION  1998  . REDUCTION MAMMAPLASTY Bilateral     Family History  Problem Relation Age of Onset  . Hypertension Father   . Diabetes Father   . COPD Mother   . Hypertension Brother   . Healthy Daughter   . Breast cancer Neg Hx     Social History   Socioeconomic History  . Marital status: Single    Spouse name: Not on file  . Number of children: Not on file  . Years of education: Not on file  . Highest education level: Not on file  Occupational History  . Occupation: [employer] home    Employer: [employer]  Tobacco Use  . Smoking status: Current Every Day Smoker    Packs/day: 1.00    Years: 15.00    Pack years: 15.00    Types: Cigarettes  . Smokeless tobacco: Never Used  . Tobacco comment: program at work--pt will try-- trying to cut down  Vaping Use  . Vaping Use: Never used  Substance and Sexual Activity  . Alcohol use: Yes    Alcohol/week: 0.0 standard drinks    Comment: Socially  . Drug use: No  . Sexual activity: Yes    Partners: Female    Birth control/protection: Surgical    Comment: partner surgical  Other Topics Concern  . Not on file  Social History Narrative   Exercising-- no   Social Determinants of Health   Financial Resource Strain: Not on file  Food Insecurity: Not on file  Transportation Needs: Not on file  Physical Activity: Not on file  Stress: Not on file  Social  Connections: Not on file  Intimate Partner Violence: Not on file    Outpatient Medications Prior to Visit  Medication Sig Dispense Refill  . acetaminophen (TYLENOL) 325 MG tablet Take 2 tablets (650 mg total) by mouth every 6 (six) hours as needed for mild pain (or Fever >/= 101). 20 tablet 0  . albuterol (VENTOLIN HFA) 108 (90 Base) MCG/ACT inhaler Inhale 2 puffs into the lungs every 6 (six) hours as needed for wheezing or shortness of breath. 18 g 5  . amLODipine (NORVASC) 5 MG tablet Take 1 tablet (5 mg total) by mouth daily. 90 tablet 1  . amoxicillin-clavulanate (AUGMENTIN) 500-125 MG tablet Take 1 tablet (500 mg total) by mouth in the morning and at bedtime. 14 tablet 0  . aspirin 81 MG chewable tablet Chew 81 mg by mouth daily.    Marland Kitchen atorvastatin (LIPITOR) 20 MG tablet TAKE 1 TABLET(20 MG) BY MOUTH AT BEDTIME 90 tablet 1  . fluconazole (DIFLUCAN) 150 MG tablet 1 po x1, may repeat in 3 days prn 2 tablet 0  . lisinopril (ZESTRIL) 20 MG tablet Take 1 tablet (20 mg total) by mouth daily. 90 tablet 1  . Multiple Vitamin (MULTIVITAMIN) tablet Take 1  tablet by mouth daily.     No facility-administered medications prior to visit.    Allergies  Allergen Reactions  . Latex Itching and Rash    ROS     Objective:    Physical Exam Constitutional:      General: She is not in acute distress.    Appearance: Normal appearance. She is not ill-appearing.  HENT:     Head: Normocephalic and atraumatic.     Right Ear: External ear normal.     Left Ear: External ear normal.  Eyes:     Extraocular Movements: Extraocular movements intact.     Pupils: Pupils are equal, round, and reactive to light.  Cardiovascular:     Rate and Rhythm: Normal rate and regular rhythm.     Pulses: Normal pulses.     Heart sounds: Normal heart sounds. No murmur heard. No gallop.   Pulmonary:     Effort: Pulmonary effort is normal. No respiratory distress.     Breath sounds: Normal breath sounds. No wheezing,  rhonchi or rales.  Skin:    General: Skin is warm and dry.  Neurological:     Mental Status: She is alert and oriented to person, place, and time.  Psychiatric:        Behavior: Behavior normal.     There were no vitals taken for this visit. Wt Readings from Last 3 Encounters:  04/24/20 151 lb 9.6 oz (68.8 kg)  01/27/20 154 lb 6.4 oz (70 kg)  04/25/19 162 lb (73.5 kg)    Diabetic Foot Exam - Simple   No data filed    Lab Results  Component Value Date   WBC 9.0 04/24/2020   HGB 16.2 (H) 04/24/2020   HCT 47.6 (H) 04/24/2020   PLT 215.0 04/24/2020   GLUCOSE 103 (H) 04/24/2020   CHOL 154 04/24/2020   TRIG 78.0 04/24/2020   HDL 35.60 (L) 04/24/2020   LDLDIRECT 149.3 08/02/2013   LDLCALC 103 (H) 04/24/2020   ALT 19 04/24/2020   AST 15 04/24/2020   NA 142 04/24/2020   K 3.6 04/24/2020   CL 106 04/24/2020   CREATININE 0.60 04/24/2020   BUN 10 04/24/2020   CO2 29 04/24/2020   TSH 1.10 04/24/2020   HGBA1C 5.5 02/07/2019   MICROALBUR 2.8 (H) 08/04/2014    Lab Results  Component Value Date   TSH 1.10 04/24/2020   Lab Results  Component Value Date   WBC 9.0 04/24/2020   HGB 16.2 (H) 04/24/2020   HCT 47.6 (H) 04/24/2020   MCV 92.0 04/24/2020   PLT 215.0 04/24/2020   Lab Results  Component Value Date   NA 142 04/24/2020   K 3.6 04/24/2020   CO2 29 04/24/2020   GLUCOSE 103 (H) 04/24/2020   BUN 10 04/24/2020   CREATININE 0.60 04/24/2020   BILITOT 0.6 04/24/2020   ALKPHOS 130 (H) 04/24/2020   AST 15 04/24/2020   ALT 19 04/24/2020   PROT 6.8 04/24/2020   ALBUMIN 4.2 04/24/2020   CALCIUM 9.7 04/24/2020   ANIONGAP 8 07/21/2018   GFR 105.04 04/24/2020   Lab Results  Component Value Date   CHOL 154 04/24/2020   Lab Results  Component Value Date   HDL 35.60 (L) 04/24/2020   Lab Results  Component Value Date   LDLCALC 103 (H) 04/24/2020   Lab Results  Component Value Date   TRIG 78.0 04/24/2020   Lab Results  Component Value Date   CHOLHDL 4  04/24/2020   Lab  Results  Component Value Date   HGBA1C 5.5 02/07/2019       Assessment & Plan:   Problem List Items Addressed This Visit   None      No orders of the defined types were placed in this encounter.   I, Dr. Seabron Spates, DO, personally preformed the services described in this documentation.  All medical record entries made by the scribe were at my direction and in my presence.  I have reviewed the chart and discharge instructions (if applicable) and agree that the record reflects my personal performance and is accurate and complete. 11/12/2020   I,[name],acting as a scribe for Donato Schultz, DO.,have documented all relevant documentation on the behalf of Donato Schultz, DO,as directed by  Donato Schultz, DO while in the presence of Donato Schultz, DO.   Shehryar [employer]

## 2021-01-11 ENCOUNTER — Other Ambulatory Visit: Payer: Self-pay

## 2021-01-11 ENCOUNTER — Telehealth: Payer: Self-pay

## 2021-01-11 ENCOUNTER — Other Ambulatory Visit: Payer: Self-pay | Admitting: Family Medicine

## 2021-01-11 DIAGNOSIS — Z1211 Encounter for screening for malignant neoplasm of colon: Secondary | ICD-10-CM

## 2021-01-11 NOTE — Telephone Encounter (Signed)
error 

## 2021-01-11 NOTE — Telephone Encounter (Signed)
Pt is calling and stating that she wants to get a Colonoscopy done since she is 50 years old now. She wants to get that done at Dr. Kenna Gilbert office. She has an upcoming appointment on 04/27/21 @9am  and did not want to make a sooner appt at this time.   Please let know if we can just put in the referral without being seen sooner. She was seen 04/2020.

## 2021-01-11 NOTE — Telephone Encounter (Signed)
Referral was placed 

## 2021-01-21 DIAGNOSIS — Z1211 Encounter for screening for malignant neoplasm of colon: Secondary | ICD-10-CM | POA: Diagnosis not present

## 2021-02-04 ENCOUNTER — Other Ambulatory Visit: Payer: Self-pay | Admitting: Family Medicine

## 2021-02-04 DIAGNOSIS — Z1231 Encounter for screening mammogram for malignant neoplasm of breast: Secondary | ICD-10-CM

## 2021-02-08 ENCOUNTER — Ambulatory Visit
Admission: RE | Admit: 2021-02-08 | Discharge: 2021-02-08 | Disposition: A | Discharge status: Home / Self Care | Payer: BC Managed Care – PPO | Source: Ambulatory Visit | Attending: Family Medicine | Admitting: Family Medicine

## 2021-02-08 ENCOUNTER — Other Ambulatory Visit: Payer: Self-pay

## 2021-02-08 DIAGNOSIS — Z1231 Encounter for screening mammogram for malignant neoplasm of breast: Secondary | ICD-10-CM | POA: Diagnosis not present

## 2021-03-10 DIAGNOSIS — K635 Polyp of colon: Secondary | ICD-10-CM | POA: Diagnosis not present

## 2021-03-10 DIAGNOSIS — Z1211 Encounter for screening for malignant neoplasm of colon: Secondary | ICD-10-CM | POA: Diagnosis not present

## 2021-03-10 DIAGNOSIS — D125 Benign neoplasm of sigmoid colon: Secondary | ICD-10-CM | POA: Diagnosis not present

## 2021-03-23 ENCOUNTER — Other Ambulatory Visit: Payer: Self-pay | Admitting: Family Medicine

## 2021-03-23 DIAGNOSIS — I10 Essential (primary) hypertension: Secondary | ICD-10-CM

## 2021-03-31 ENCOUNTER — Ambulatory Visit (INDEPENDENT_AMBULATORY_CARE_PROVIDER_SITE_OTHER): Payer: BC Managed Care – PPO | Admitting: Family Medicine

## 2021-03-31 ENCOUNTER — Ambulatory Visit: Payer: Self-pay

## 2021-03-31 ENCOUNTER — Other Ambulatory Visit: Payer: Self-pay

## 2021-03-31 VITALS — BP 124/82 | HR 90 | Ht 62.0 in | Wt 153.4 lb

## 2021-03-31 DIAGNOSIS — Z720 Tobacco use: Secondary | ICD-10-CM

## 2021-03-31 DIAGNOSIS — M7122 Synovial cyst of popliteal space [Baker], left knee: Secondary | ICD-10-CM | POA: Diagnosis not present

## 2021-03-31 DIAGNOSIS — M79662 Pain in left lower leg: Secondary | ICD-10-CM

## 2021-03-31 NOTE — Progress Notes (Signed)
+  6        I, Philbert Riser, LAT, ATC acting as a Neurosurgeon for Clementeen Graham, MD.  Subjective:    CC: L calf pain  HPI: Pt is a 50 y/o female c/o L calf pain. Pt was previously seen by Dr. Katrinka Blazing on 04/25/19 for cervical radiculopathy and R thumb triggering. Today, pt reports L calf pain x 2+ months. Pt noticed the pain after she had been shampooing her carpets. Pt locates pain to the belly of the gastroc. No distal numbness/tingling.  She notes the pain occurs with walking.  For example she can only go about a half a parking lot before the pain gets more intense.  The pain improves with rest.  She notes that she is a smoker and does have hypertension.  She also notes occasional calf swelling.  Pertinent review of Systems: No fevers or chills  Relevant historical information: Smoker.  Hypertension.  Patient works as a Holiday representative.   Objective:    Vitals:   03/31/21 1513  BP: 124/82  Pulse: 90  SpO2: 98%   General: Well Developed, well nourished, and in no acute distress.   MSK: Left calf normal-appearing well-developed musculature. Normal knee and foot and ankle motion. Normal foot and ankle strength.  No significant pain with resisted foot plantarflexion. Decreased or diminished pulses left posterior tibialis and dorsal pedis.   Lab and Radiology Results  Diagnostic Limited MSK Ultrasound of: Left gastrocnemius No significant abnormality at gastrocnemius.  No obvious tear. Normal appearing Achilles tendon. Posterior medial knee reveals small Baker's cyst. Impression: Small Baker's cyst.  Normal-appearing gastrocnemius and Achilles tendon.    Impression and Recommendations:    Assessment and Plan: 50 y.o. female with left calf pain.  Chief concern is claudication based on history of pain in the calf with ambulation.  Additionally patient may have a calf strain.  Additionally she has a Baker's cyst and may experience occasional leakage of the Baker's cyst into her  calf causing some pain and swelling.  Plan for ABI to assess for possible claudication.  Additionally work on home exercise program eccentric exercises for calf and Achilles tendon strengthening.  Additionally recommend compression sleeve. Recheck in 6 weeks.Marland Kitchen  PDMP not reviewed this encounter. Orders Placed This Encounter  Procedures   [place] LIMITED JOINT SPACE STRUCTURES LOW LEFT(NO LINKED CHARGES)    Standing Status:   Future    Number of Occurrences:   1    Standing Expiration Date:   09/29/2021    Order Specific Question:   Reason for Exam (SYMPTOM  OR DIAGNOSIS REQUIRED)    Answer:   left calf pain    Order Specific Question:   Preferred imaging location?    Answer:   [facility]   No orders of the defined types were placed in this encounter.   Discussed warning signs or symptoms. Please see discharge instructions. Patient expresses understanding.   The above documentation has been reviewed and is accurate and complete Clementeen Graham, M.D.

## 2021-03-31 NOTE — Patient Instructions (Signed)
Thank you for coming in today.   I recommend you obtained a compression sleeve to help with your joint problems. There are many options on the market however I recommend obtaining a full calf Body Helix compression sleeve.  You can find information (including how to appropriate measure yourself for sizing) can be found at [email or web address].  Many of these products are health savings account (HSA) eligible.   You can use the compression sleeve at any time throughout the day but is most important to use while being active as well as for 2 hours post-activity.   It is appropriate to ice following activity with the compression sleeve in place.   Plan for ABI. You should hear from the imaging location soon.   Recheck in 6 weeks.

## 2021-04-01 ENCOUNTER — Ambulatory Visit (HOSPITAL_COMMUNITY)
Admission: RE | Admit: 2021-04-01 | Discharge: 2021-04-01 | Disposition: A | Discharge status: Home / Self Care | Payer: BC Managed Care – PPO | Source: Ambulatory Visit | Attending: Family Medicine | Admitting: Family Medicine

## 2021-04-01 DIAGNOSIS — Z720 Tobacco use: Secondary | ICD-10-CM | POA: Insufficient documentation

## 2021-04-01 DIAGNOSIS — M79662 Pain in left lower leg: Secondary | ICD-10-CM | POA: Insufficient documentation

## 2021-04-02 ENCOUNTER — Encounter: Payer: Self-pay | Admitting: Family Medicine

## 2021-04-02 DIAGNOSIS — I739 Peripheral vascular disease, unspecified: Secondary | ICD-10-CM | POA: Insufficient documentation

## 2021-04-02 NOTE — Progress Notes (Signed)
Arterial Doppler examination of the leg shows that you do have some diminished arterial blood flow to the left leg.  This is mild and may explain your pain or may not.  Recommend working on the exercises we discussed in clinic this week and rechecking as scheduled.

## 2021-04-11 ENCOUNTER — Other Ambulatory Visit: Payer: Self-pay | Admitting: Family Medicine

## 2021-04-11 DIAGNOSIS — E785 Hyperlipidemia, unspecified: Secondary | ICD-10-CM

## 2021-04-26 ENCOUNTER — Other Ambulatory Visit: Payer: Self-pay

## 2021-04-27 ENCOUNTER — Ambulatory Visit (INDEPENDENT_AMBULATORY_CARE_PROVIDER_SITE_OTHER): Payer: BC Managed Care – PPO | Admitting: Family Medicine

## 2021-04-27 ENCOUNTER — Encounter: Payer: Self-pay | Admitting: Family Medicine

## 2021-04-27 VITALS — BP 128/82 | HR 86 | Temp 97.8°F | Resp 18 | Ht 62.0 in | Wt 152.8 lb

## 2021-04-27 DIAGNOSIS — Z0000 Encounter for general adult medical examination without abnormal findings: Secondary | ICD-10-CM

## 2021-04-27 DIAGNOSIS — E785 Hyperlipidemia, unspecified: Secondary | ICD-10-CM | POA: Diagnosis not present

## 2021-04-27 DIAGNOSIS — J4520 Mild intermittent asthma, uncomplicated: Secondary | ICD-10-CM | POA: Diagnosis not present

## 2021-04-27 DIAGNOSIS — I10 Essential (primary) hypertension: Secondary | ICD-10-CM

## 2021-04-27 DIAGNOSIS — I739 Peripheral vascular disease, unspecified: Secondary | ICD-10-CM

## 2021-04-27 DIAGNOSIS — Z720 Tobacco use: Secondary | ICD-10-CM

## 2021-04-27 LAB — CBC WITH DIFFERENTIAL/PLATELET
Basophils Absolute: 0.1 10*3/uL (ref 0.0–0.1)
Basophils Relative: 0.6 % (ref 0.0–3.0)
Eosinophils Absolute: 0.4 10*3/uL (ref 0.0–0.7)
Eosinophils Relative: 4.3 % (ref 0.0–5.0)
HCT: 48.1 % — ABNORMAL HIGH (ref 36.0–46.0)
Hemoglobin: 16.6 g/dL — ABNORMAL HIGH (ref 12.0–15.0)
Lymphocytes Relative: 24.6 % (ref 12.0–46.0)
Lymphs Abs: 2.5 10*3/uL (ref 0.7–4.0)
MCHC: 34.5 g/dL (ref 30.0–36.0)
MCV: 91.2 fl (ref 78.0–100.0)
Monocytes Absolute: 0.5 10*3/uL (ref 0.1–1.0)
Monocytes Relative: 4.4 % (ref 3.0–12.0)
Neutro Abs: 6.8 10*3/uL (ref 1.4–7.7)
Neutrophils Relative %: 66.1 % (ref 43.0–77.0)
Platelets: 227 10*3/uL (ref 150.0–400.0)
RBC: 5.27 Mil/uL — ABNORMAL HIGH (ref 3.87–5.11)
RDW: 13.2 % (ref 11.5–15.5)
WBC: 10.3 10*3/uL (ref 4.0–10.5)

## 2021-04-27 LAB — COMPREHENSIVE METABOLIC PANEL
ALT: 18 U/L (ref 0–35)
AST: 13 U/L (ref 0–37)
Albumin: 4.4 g/dL (ref 3.5–5.2)
Alkaline Phosphatase: 168 U/L — ABNORMAL HIGH (ref 39–117)
BUN: 9 mg/dL (ref 6–23)
CO2: 30 mEq/L (ref 19–32)
Calcium: 9.8 mg/dL (ref 8.4–10.5)
Chloride: 104 mEq/L (ref 96–112)
Creatinine, Ser: 0.56 mg/dL (ref 0.40–1.20)
GFR: 106.05 mL/min (ref >60.00)
Glucose, Bld: 110 mg/dL — ABNORMAL HIGH (ref 70–99)
Potassium: 3.5 mEq/L (ref 3.5–5.1)
Sodium: 141 mEq/L (ref 135–145)
Total Bilirubin: 0.6 mg/dL (ref 0.2–1.2)
Total Protein: 6.8 g/dL (ref 6.0–8.3)

## 2021-04-27 LAB — LIPID PANEL
Cholesterol: 159 mg/dL (ref 0–200)
HDL: 38.6 mg/dL — ABNORMAL LOW (ref >39.00)
LDL Cholesterol: 99 mg/dL (ref 0–99)
NonHDL: 120.05
Total CHOL/HDL Ratio: 4
Triglycerides: 107 mg/dL (ref 0.0–149.0)
VLDL: 21.4 mg/dL (ref 0.0–40.0)

## 2021-04-27 LAB — TSH: TSH: 1.99 u[IU]/mL (ref 0.35–5.50)

## 2021-04-27 MED ORDER — ALBUTEROL SULFATE HFA 108 (90 BASE) MCG/ACT IN AERS: 2.0000 | INHALATION_SPRAY | Freq: Four times a day (QID) | RESPIRATORY_TRACT | 5 refills | Status: AC | PRN

## 2021-04-27 MED ORDER — AMLODIPINE BESYLATE 5 MG PO TABS: ORAL_TABLET | ORAL | 1 refills | Status: DC

## 2021-04-27 MED ORDER — ATORVASTATIN CALCIUM 20 MG PO TABS: ORAL_TABLET | ORAL | 1 refills | Status: DC

## 2021-04-27 NOTE — Assessment & Plan Note (Signed)
stable °

## 2021-04-27 NOTE — Assessment & Plan Note (Signed)
Well controlled, no changes to meds. Encouraged heart healthy diet such as the DASH diet and exercise as tolerated.  °

## 2021-04-27 NOTE — Assessment & Plan Note (Signed)
con't to wean off

## 2021-04-27 NOTE — Assessment & Plan Note (Signed)
con't statin and aspirin

## 2021-04-27 NOTE — Progress Notes (Signed)
Subjective:   By signing my name below, I, Kimberly Rollins, attest that this documentation has been prepared under the direction and in the presence of Dr. Seabron Spates, DO. 04/27/2021     Patient ID: Kimberly Rollins, female    DOB: 1970-12-13, 50 y.o.   MRN: [id number]  Chief Complaint  Patient presents with   Annual Exam    Pt states fasting     HPI Patient is in today for a comprehensive physical exam.   Blood pressure- Her blood pressure is elevated during this visit. She checks it 3x every week and reports the measurements are normal. She continues taking lisinopril daily PO, 5 mg amlodipine daily PO, and reports having anxiety when she stops taking lisinopril.   BP Readings from Last 3 Encounters:  04/27/21 128/82  03/31/21 124/82  04/24/20 114/74   Pulse Readings from Last 3 Encounters:  04/27/21 86  03/31/21 90  04/24/20 80    Post-menopause bleeding- She has not seen a GYN specialist to evaluate her prior post-menopause bleeding. She reports having no other episodes of bleeding.   CPE She denies having any fever, new moles, congestion, sore throat, new muscle pain, new joint pain, chest pain, cough, SOB, wheezing, n/v/d, constipation, blood in stool, dysuria, frequency, hematuria, or headaches at this time. Social history- She has no recent changes to her family medical history. She continues smoking at this time. She reports trying chantix in the past. She does not use drugs.  Immunizations- She is not interested in receiving a flu vaccine.  Diet- She is trying to maintain a healthy diet.  Exercise- She participates in regular exercise by walking.  Mammogram- Last completed 02/08/2021. Results are normal. Repeat in 1 year.  Colonoscopy- She reports completing it on 03/10/2021 and found 2 polyps. She was recommend to return in 7 years.  Pap smear- Last completed 02/06/2019. Results are normal. Repeat in 3 years.    Past Medical History:  Diagnosis Date    Asthma    Seasonal   Hyperlipidemia    Hypertension     Past Surgical History:  Procedure Laterality Date   BREAST REDUCTION SURGERY     CESAREAN SECTION  1998   REDUCTION MAMMAPLASTY Bilateral     Family History  Problem Relation Age of Onset   Hypertension Father    Diabetes Father    COPD Mother    Hypertension Brother    Healthy Daughter    Breast cancer Neg Hx     Social History   Socioeconomic History   Marital status: Single    Spouse name: Not on file   Number of children: Not on file   Years of education: Not on file   Highest education level: Not on file  Occupational History   Occupation: [employer]ns[employer] home    Employer:   Tobacco Use   Smoking status: Every Day    Packs/day: 1.00    Years: 15.00    Pack years: 15.00    Types: Cigarettes   Smokeless tobacco: Never   Tobacco comments:    program at work--pt will try-- trying to cut down  Vaping Use   Vaping Use: Never used  Substance and Sexual Activity   Alcohol use: Yes    Alcohol/week: 0.0 standard drinks    Comment: Socially   Drug use: No   Sexual activity: Yes    Partners: Female    Birth control/protection: Surgical    Comment: partner surgical  Other Topics Concern  Not on file  Social History Narrative   Exercising-- walking some    Social Determinants of Health   Financial Resource Strain: Not on file  Food Insecurity: Not on file  Transportation Needs: Not on file  Physical Activity: Not on file  Stress: Not on file  Social Connections: Not on file  Intimate Partner Violence: Not on file    Outpatient Medications Prior to Visit  Medication Sig Dispense Refill   acetaminophen (TYLENOL) 325 MG tablet Take 2 tablets (650 mg total) by mouth every 6 (six) hours as needed for mild pain (or Fever >/= 101). 20 tablet 0   aspirin 81 MG chewable tablet Chew 81 mg by mouth daily.     Multiple Vitamin (MULTIVITAMIN) tablet Take 1 tablet by mouth daily.     albuterol  (VENTOLIN HFA) 108 (90 Base) MCG/ACT inhaler Inhale 2 puffs into the lungs every 6 (six) hours as needed for wheezing or shortness of breath. 18 g 5   amLODipine (NORVASC) 5 MG tablet TAKE 1 TABLET(5 MG) BY MOUTH DAILY 90 tablet 1   atorvastatin (LIPITOR) 20 MG tablet TAKE 1 TABLET(20 MG) BY MOUTH AT BEDTIME 90 tablet 0   amoxicillin-clavulanate (AUGMENTIN) 500-125 MG tablet Take 1 tablet (500 mg total) by mouth in the morning and at bedtime. (Patient not taking: Reported on 04/27/2021) 14 tablet 0   fluconazole (DIFLUCAN) 150 MG tablet 1 po x1, may repeat in 3 days prn (Patient not taking: Reported on 04/27/2021) 2 tablet 0   lisinopril (ZESTRIL) 20 MG tablet Take 1 tablet (20 mg total) by mouth daily. (Patient not taking: No sig reported) 90 tablet 1   No facility-administered medications prior to visit.    Allergies  Allergen Reactions   Latex Itching and Rash   Lisinopril Anxiety    Review of Systems  Constitutional:  Negative for fever.  HENT:  Negative for congestion and sore throat.   Respiratory:  Negative for cough, shortness of breath and wheezing.   Cardiovascular:  Negative for chest pain.  Gastrointestinal:  Negative for blood in stool, constipation, diarrhea, nausea and vomiting.  Genitourinary:  Negative for dysuria, frequency and hematuria.  Musculoskeletal:  Negative for joint pain and myalgias.  Skin:        (-)New moles  Neurological:  Negative for headaches.      Objective:    Physical Exam Constitutional:      General: She is not in acute distress.    Appearance: Normal appearance. She is not ill-appearing.  HENT:     Head: Normocephalic and atraumatic.     Right Ear: Tympanic membrane, ear canal and external ear normal.     Left Ear: Tympanic membrane, ear canal and external ear normal.  Eyes:     Extraocular Movements: Extraocular movements intact.     Pupils: Pupils are equal, round, and reactive to light.  Cardiovascular:     Rate and Rhythm: Normal  rate and regular rhythm.     Heart sounds: Normal heart sounds. No murmur heard.   No gallop.  Pulmonary:     Effort: Pulmonary effort is normal. No respiratory distress.     Breath sounds: Normal breath sounds. No wheezing or rales.  Abdominal:     General: Bowel sounds are normal. There is no distension.     Palpations: Abdomen is soft.     Tenderness: There is no abdominal tenderness. There is no guarding.  Skin:    General: Skin is warm and dry.  Neurological:     Mental Status: She is alert and oriented to person, place, and time.  Psychiatric:        Behavior: Behavior normal.    BP 128/82 (BP Location: Left Arm, Cuff Size: Normal)   Pulse 86   Temp 97.8 F (36.6 C) (Oral)   Resp 18   Ht 5\' 2"  (1.575 m)   Wt 152 lb 12.8 oz (69.3 kg)   SpO2 98%   BMI 27.95 kg/m  Wt Readings from Last 3 Encounters:  04/27/21 152 lb 12.8 oz (69.3 kg)  03/31/21 153 lb 6.4 oz (69.6 kg)  04/24/20 151 lb 9.6 oz (68.8 kg)    Diabetic Foot Exam - Simple   No data filed    Lab Results  Component Value Date   WBC 9.0 04/24/2020   HGB 16.2 (H) 04/24/2020   HCT 47.6 (H) 04/24/2020   PLT 215.0 04/24/2020   GLUCOSE 103 (H) 04/24/2020   CHOL 154 04/24/2020   TRIG 78.0 04/24/2020   HDL 35.60 (L) 04/24/2020   LDLDIRECT 149.3 08/02/2013   LDLCALC 103 (H) 04/24/2020   ALT 19 04/24/2020   AST 15 04/24/2020   NA 142 04/24/2020   K 3.6 04/24/2020   CL 106 04/24/2020   CREATININE 0.60 04/24/2020   BUN 10 04/24/2020   CO2 29 04/24/2020   TSH 1.10 04/24/2020   HGBA1C 5.5 02/07/2019   MICROALBUR 2.8 (H) 08/04/2014    Lab Results  Component Value Date   TSH 1.10 04/24/2020   Lab Results  Component Value Date   WBC 9.0 04/24/2020   HGB 16.2 (H) 04/24/2020   HCT 47.6 (H) 04/24/2020   MCV 92.0 04/24/2020   PLT 215.0 04/24/2020   Lab Results  Component Value Date   NA 142 04/24/2020   K 3.6 04/24/2020   CO2 29 04/24/2020   GLUCOSE 103 (H) 04/24/2020   BUN 10 04/24/2020    CREATININE 0.60 04/24/2020   BILITOT 0.6 04/24/2020   ALKPHOS 130 (H) 04/24/2020   AST 15 04/24/2020   ALT 19 04/24/2020   PROT 6.8 04/24/2020   ALBUMIN 4.2 04/24/2020   CALCIUM 9.7 04/24/2020   ANIONGAP 8 07/21/2018   GFR 105.04 04/24/2020   Lab Results  Component Value Date   CHOL 154 04/24/2020   Lab Results  Component Value Date   HDL 35.60 (L) 04/24/2020   Lab Results  Component Value Date   LDLCALC 103 (H) 04/24/2020   Lab Results  Component Value Date   TRIG 78.0 04/24/2020   Lab Results  Component Value Date   CHOLHDL 4 04/24/2020   Lab Results  Component Value Date   HGBA1C 5.5 02/07/2019       Assessment & Plan:   Problem List Items Addressed This Visit       Unprioritized   Asthma in adult    stable      Relevant Medications   albuterol (VENTOLIN HFA) 108 (90 Base) MCG/ACT inhaler   HTN (hypertension)    Well controlled, no changes to meds. Encouraged heart healthy diet such as the DASH diet and exercise as tolerated.       Relevant Medications   atorvastatin (LIPITOR) 20 MG tablet   amLODipine (NORVASC) 5 MG tablet   Hyperlipidemia    Encourage heart healthy diet such as MIND or DASH diet, increase exercise, avoid trans fats, simple carbohydrates and processed foods, consider a krill or fish or flaxseed oil cap daily.  Relevant Medications   atorvastatin (LIPITOR) 20 MG tablet   amLODipine (NORVASC) 5 MG tablet   Other Relevant Orders   TSH   Lipid panel   Comprehensive metabolic panel   CBC with Differential/Platelet   PAD (peripheral artery disease) (HCC)    con't statin and aspirin       Relevant Medications   atorvastatin (LIPITOR) 20 MG tablet   amLODipine (NORVASC) 5 MG tablet   Preventative health care - Primary    ghm utd Check labs       Relevant Orders   TSH   Lipid panel   Comprehensive metabolic panel   CBC with Differential/Platelet   Tobacco abuse    con't to wean off      Other Visit Diagnoses      Essential hypertension       Relevant Medications   atorvastatin (LIPITOR) 20 MG tablet   amLODipine (NORVASC) 5 MG tablet   Other Relevant Orders   TSH   Lipid panel   Comprehensive metabolic panel   CBC with Differential/Platelet   Mild intermittent asthma, unspecified whether complicated       Relevant Medications   albuterol (VENTOLIN HFA) 108 (90 Base) MCG/ACT inhaler        Meds ordered this encounter  Medications   atorvastatin (LIPITOR) 20 MG tablet    Sig: 1 po qhs    Dispense:  90 tablet    Refill:  1   amLODipine (NORVASC) 5 MG tablet    Sig: TAKE 1 TABLET(5 MG) BY MOUTH DAILY    Dispense:  90 tablet    Refill:  1   albuterol (VENTOLIN HFA) 108 (90 Base) MCG/ACT inhaler    Sig: Inhale 2 puffs into the lungs every 6 (six) hours as needed for wheezing or shortness of breath.    Dispense:  18 g    Refill:  5    I, Dr. Roma Schanz, DO, personally preformed the services described in this documentation.  All medical record entries made by the scribe were at my direction and in my presence.  I have reviewed the chart and discharge instructions (if applicable) and agree that the record reflects my personal performance and is accurate and complete. 04/27/2021   I,Kimberly Rollins,acting as a scribe for Ann Held, DO.,have documented all relevant documentation on the behalf of Ann Held, DO,as directed by  Ann Held, DO while in the presence of Ann Held, DO.   Ann Held, DO

## 2021-04-27 NOTE — Assessment & Plan Note (Signed)
Encourage heart healthy diet such as MIND or DASH diet, increase exercise, avoid trans fats, simple carbohydrates and processed foods, consider a krill or fish or flaxseed oil cap daily.  °

## 2021-04-27 NOTE — Patient Instructions (Signed)
Preventive Care 92-51 Years Old, Female Preventive care refers to lifestyle choices and visits with your health care provider that can promote health and wellness. Preventive care visits are also called wellness exams. What can I expect for my preventive care visit? Counseling Your health care provider may ask you questions about your: Medical history, including: Past medical problems. Family medical history. Pregnancy history. Current health, including: Menstrual cycle. Method of birth control. Emotional well-being. Home life and relationship well-being. Sexual activity and sexual health. Lifestyle, including: Alcohol, nicotine or tobacco, and drug use. Access to firearms. Diet, exercise, and sleep habits. Work and work Statistician. Sunscreen use. Safety issues such as seatbelt and bike helmet use. Physical exam Your health care provider will check your: Height and weight. These may be used to calculate your BMI (body mass index). BMI is a measurement that tells if you are at a healthy weight. Waist circumference. This measures the distance around your waistline. This measurement also tells if you are at a healthy weight and may help predict your risk of certain diseases, such as type 2 diabetes and high blood pressure. Heart rate and blood pressure. Body temperature. Skin for abnormal spots. What immunizations do I need? Vaccines are usually given at various ages, according to a schedule. Your health care provider will recommend vaccines for you based on your age, medical history, and lifestyle or other factors, such as travel or where you work. What tests do I need? Screening Your health care provider may recommend screening tests for certain conditions. This may include: Lipid and cholesterol levels. Diabetes screening. This is done by checking your blood sugar (glucose) after you have not eaten for a while (fasting). Pelvic exam and Pap test. Hepatitis B test. Hepatitis C  test. HIV (human immunodeficiency virus) test. STI (sexually transmitted infection) testing, if you are at risk. Lung cancer screening. Colorectal cancer screening. Mammogram. Talk with your health care provider about when you should start having regular mammograms. This may depend on whether you have a family history of breast cancer. BRCA-related cancer screening. This may be done if you have a family history of breast, ovarian, tubal, or peritoneal cancers. Bone density scan. This is done to screen for osteoporosis. Talk with your health care provider about your test results, treatment options, and if necessary, the need for more tests. Follow these instructions at home: Eating and drinking  Eat a diet that includes fresh fruits and vegetables, whole grains, lean protein, and low-fat dairy products. Take vitamin and mineral supplements as recommended by your health care provider. Do not drink alcohol if: Your health care provider tells you not to drink. You are pregnant, may be pregnant, or are planning to become pregnant. If you drink alcohol: Limit how much you have to 0-1 drink a day. Know how much alcohol is in your drink. In the [place] one drink equals one 12 oz bottle of beer (355 mL), one 5 oz glass of wine (148 mL), or one 1 oz glass of hard liquor (44 mL). Lifestyle Brush your teeth every morning and night with fluoride toothpaste. Floss one time each day. Exercise for at least 30 minutes 5 or more days each week. Do not use any products that contain nicotine or tobacco. These products include cigarettes, chewing tobacco, and vaping devices, such as e-cigarettes. If you need help quitting, ask your health care provider. Do not use drugs. If you are sexually active, practice safe sex. Use a condom or other form of protection to prevent  STIs. If you do not wish to become pregnant, use a form of birth control. If you plan to become pregnant, see your health care provider for a  prepregnancy visit. Take aspirin only as told by your health care provider. Make sure that you understand how much to take and what form to take. Work with your health care provider to find out whether it is safe and beneficial for you to take aspirin daily. Find healthy ways to manage stress, such as: Meditation, yoga, or listening to music. Journaling. Talking to a trusted person. Spending time with friends and family. Minimize exposure to UV radiation to reduce your risk of skin cancer. Safety Always wear your seat belt while driving or riding in a vehicle. Do not drive: If you have been drinking alcohol. Do not ride with someone who has been drinking. When you are tired or distracted. While texting. If you have been using any mind-altering substances or drugs. Wear a helmet and other protective equipment during sports activities. If you have firearms in your house, make sure you follow all gun safety procedures. Seek help if you have been physically or sexually abused. What's next? Visit your health care provider once a year for an annual wellness visit. Ask your health care provider how often you should have your eyes and teeth checked. Stay up to date on all vaccines. This information is not intended to replace advice given to you by your health care provider. Make sure you discuss any questions you have with your health care provider. Document Revised: 11/25/2020 Document Reviewed: 11/25/2020 Elsevier Patient Education  [employer].

## 2021-04-27 NOTE — Assessment & Plan Note (Signed)
ghm utd Check labs  

## 2021-05-01 ENCOUNTER — Other Ambulatory Visit: Payer: Self-pay | Admitting: Family Medicine

## 2021-05-01 DIAGNOSIS — R739 Hyperglycemia, unspecified: Secondary | ICD-10-CM

## 2021-05-01 DIAGNOSIS — E785 Hyperlipidemia, unspecified: Secondary | ICD-10-CM

## 2021-05-18 DIAGNOSIS — M25811 Other specified joint disorders, right shoulder: Secondary | ICD-10-CM | POA: Diagnosis not present

## 2021-06-16 ENCOUNTER — Ambulatory Visit: Payer: Self-pay

## 2021-06-16 ENCOUNTER — Other Ambulatory Visit: Payer: Self-pay

## 2021-06-16 ENCOUNTER — Ambulatory Visit (INDEPENDENT_AMBULATORY_CARE_PROVIDER_SITE_OTHER): Payer: BC Managed Care – PPO

## 2021-06-16 ENCOUNTER — Ambulatory Visit: Payer: BC Managed Care – PPO | Admitting: Family Medicine

## 2021-06-16 VITALS — BP 140/92 | HR 91 | Ht 62.0 in | Wt 154.0 lb

## 2021-06-16 DIAGNOSIS — M79641 Pain in right hand: Secondary | ICD-10-CM

## 2021-06-16 DIAGNOSIS — M5412 Radiculopathy, cervical region: Secondary | ICD-10-CM

## 2021-06-16 DIAGNOSIS — G8929 Other chronic pain: Secondary | ICD-10-CM

## 2021-06-16 DIAGNOSIS — M542 Cervicalgia: Secondary | ICD-10-CM | POA: Diagnosis not present

## 2021-06-16 MED ORDER — GABAPENTIN 300 MG PO CAPS: 300.0000 mg | ORAL_CAPSULE | Freq: Three times a day (TID) | ORAL | 2 refills | Status: DC

## 2021-06-16 MED ORDER — PREDNISONE 20 MG PO TABS: 20.0000 mg | ORAL_TABLET | Freq: Two times a day (BID) | ORAL | 0 refills | Status: DC

## 2021-06-16 NOTE — Progress Notes (Signed)
I, Peterson Lombard, LAT, ATC acting as a scribe for Lynne Leader, MD.  Kimberly Rollins is a 51 y.o. female who presents to [facility] today for R hand pain. Pt was previously seen by Dr. Georgina Snell on 03/31/21 for L calf pain. Today, pt c/o R hand pain x over 1 month. Pt locates pain deep to R scapula and R trapz, w/ radiating pain into arm, ulnar aspect of forearm. 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   Exam:  BP (!) 140/92    Pulse 91    Ht 5\' 2"  (1.575 m)    Wt 154 lb (69.9 kg)    LMP 01/12/2020    SpO2 99%    BMI 28.17 kg/m  General: Well Developed, well nourished, and in no acute distress.   MSK: C-spine normal-appearing Nontender midline. Normal cervical motion. Mildly positive right Spurling's test. Upper extremity strength is intact with the exception of right elbow extension which is diminished. Reflexes are intact. Pulses capillary refill and sensation are intact.  Right elbow normal-appearing Positive Tinel's at cubital tunnel.   Lab and Radiology Results No results found for this or any previous visit (from the past 72 hour(s)). DG Cervical Spine Complete  Result Date: 06/17/2021 CLINICAL DATA:  Neck pain. EXAM: CERVICAL SPINE - COMPLETE 4+ VIEW COMPARISON:  Cervical spine radiograph dated 04/14/2014. FINDINGS: No acute fracture or subluxation of the cervical spine. There is reversal of normal cervical lordosis which may be positional or due to muscle spasm. Multilevel degenerative changes with disc space narrowing and endplate irregularity and spurring. The visualized posterior elements and odontoid  appear intact. There is anatomic alignment of the lateral masses of C1 and C2. The soft tissues are unremarkable. IMPRESSION: 1. No acute cervical spine pathology. 2. Multilevel degenerative changes. Electronically Signed   By: Anner Crete M.D.   On: 06/17/2021 00:44     I, Lynne Leader, personally (independently) visualized and performed the interpretation of the images attached in this note. Mild DJD present.  No acute fractures.  Assessment and Plan: 51 y.o. female with  Right arm radiating pain and paresthesias and weakness.  Differential includes right C8 cervical radiculopathy or right cubital tunnel syndrome or perhaps both. Additionally she has pain in her thoracic spine area that may be muscle spasm related or related to the potential cervical radiculopathy. Plan to treat with course of prednisone and gabapentin, cubital tunnel brace and physical therapy.  If not improving consider cervical spine MRI.   PDMP not reviewed this encounter. Orders Placed This Encounter  Procedures   DG Cervical Spine Complete    Standing Status:   Future    Number of Occurrences:   1    Standing Expiration Date:   06/16/2022    Order Specific Question:   Reason for Exam (SYMPTOM  OR DIAGNOSIS REQUIRED)    Answer:   neck pain    Order Specific Question:   Preferred imaging location?    Answer:   [facility]    Order Specific Question:   Is patient pregnant?    Answer:   No   Ambulatory referral to Physical Therapy    Referral  Priority:   Routine    Referral Type:   Physical Medicine    Referral Reason:   Specialty Services Required    Requested Specialty:   Physical Therapy    Number of Visits Requested:   1   Meds ordered this encounter  Medications   gabapentin (NEURONTIN) 300 MG capsule    Sig: Take 1 capsule (300 mg total) by mouth 3 (three) times daily.    Dispense:  30 capsule    Refill:  2   predniSONE (DELTASONE) 20 MG tablet    Sig: Take 1 tablet (20 mg total) by mouth 2  (two) times daily.    Dispense:  14 tablet    Refill:  0     Discussed warning signs or symptoms. Please see discharge instructions. Patient expresses understanding.   The above documentation has been reviewed and is accurate and complete Lynne Leader, M.D.

## 2021-06-16 NOTE — Patient Instructions (Addendum)
I've sent the medications to your pharmacy  I've referred you to Physical Therapy.  Let us know if you don't hear from them in one week.   Please get an Xray today before you leave

## 2021-06-17 NOTE — Progress Notes (Signed)
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

## 2021-06-30 ENCOUNTER — Encounter: Payer: Self-pay | Admitting: Family Medicine

## 2021-06-30 MED ORDER — FLUCONAZOLE 100 MG PO TABS: ORAL_TABLET | ORAL | 0 refills | Status: AC

## 2021-07-06 ENCOUNTER — Encounter (HOSPITAL_BASED_OUTPATIENT_CLINIC_OR_DEPARTMENT_OTHER): Payer: Self-pay

## 2021-07-06 ENCOUNTER — Emergency Department (HOSPITAL_BASED_OUTPATIENT_CLINIC_OR_DEPARTMENT_OTHER)
Admission: EM | Admit: 2021-07-06 | Discharge: 2021-07-06 | Disposition: A | Discharge status: Home / Self Care | Payer: BC Managed Care – PPO | Attending: Emergency Medicine | Admitting: Emergency Medicine

## 2021-07-06 ENCOUNTER — Other Ambulatory Visit: Payer: Self-pay

## 2021-07-06 DIAGNOSIS — Z79899 Other long term (current) drug therapy: Secondary | ICD-10-CM | POA: Insufficient documentation

## 2021-07-06 DIAGNOSIS — R739 Hyperglycemia, unspecified: Secondary | ICD-10-CM | POA: Diagnosis not present

## 2021-07-06 DIAGNOSIS — Z7984 Long term (current) use of oral hypoglycemic drugs: Secondary | ICD-10-CM | POA: Diagnosis not present

## 2021-07-06 DIAGNOSIS — Z7951 Long term (current) use of inhaled steroids: Secondary | ICD-10-CM | POA: Diagnosis not present

## 2021-07-06 DIAGNOSIS — Z7982 Long term (current) use of aspirin: Secondary | ICD-10-CM | POA: Insufficient documentation

## 2021-07-06 DIAGNOSIS — E1165 Type 2 diabetes mellitus with hyperglycemia: Secondary | ICD-10-CM | POA: Diagnosis not present

## 2021-07-06 DIAGNOSIS — D72829 Elevated white blood cell count, unspecified: Secondary | ICD-10-CM | POA: Insufficient documentation

## 2021-07-06 DIAGNOSIS — R3589 Other polyuria: Secondary | ICD-10-CM | POA: Diagnosis not present

## 2021-07-06 DIAGNOSIS — R718 Other abnormality of red blood cells: Secondary | ICD-10-CM | POA: Insufficient documentation

## 2021-07-06 DIAGNOSIS — Z91040 Latex allergy status: Secondary | ICD-10-CM | POA: Insufficient documentation

## 2021-07-06 LAB — BASIC METABOLIC PANEL
Anion gap: 8 (ref 5–15)
BUN: 11 mg/dL (ref 6–20)
CO2: 28 mmol/L (ref 22–32)
Calcium: 11.4 mg/dL — ABNORMAL HIGH (ref 8.9–10.3)
Chloride: 101 mmol/L (ref 98–111)
Creatinine, Ser: 0.75 mg/dL (ref 0.44–1.00)
GFR, Estimated: >60 mL/min (ref >60)
Glucose, Bld: 426 mg/dL — ABNORMAL HIGH (ref 70–99)
Potassium: 3.8 mmol/L (ref 3.5–5.1)
Sodium: 137 mmol/L (ref 135–145)

## 2021-07-06 LAB — CBC
HCT: 45.8 % (ref 36.0–46.0)
Hemoglobin: 16.5 g/dL — ABNORMAL HIGH (ref 12.0–15.0)
MCH: 31 pg (ref 26.0–34.0)
MCHC: 36 g/dL (ref 30.0–36.0)
MCV: 86.1 fL (ref 80.0–100.0)
Platelets: 185 10*3/uL (ref 150–400)
RBC: 5.32 MIL/uL — ABNORMAL HIGH (ref 3.87–5.11)
RDW: 12.2 % (ref 11.5–15.5)
WBC: 12.5 10*3/uL — ABNORMAL HIGH (ref 4.0–10.5)
nRBC: 0 % (ref 0.0–0.2)

## 2021-07-06 LAB — CBG MONITORING, ED
Glucose-Capillary: 415 mg/dL — ABNORMAL HIGH (ref 70–99)
Glucose-Capillary: 321 mg/dL — ABNORMAL HIGH (ref 70–99)
Glucose-Capillary: 258 mg/dL — ABNORMAL HIGH (ref 70–99)

## 2021-07-06 LAB — URINALYSIS, ROUTINE W REFLEX MICROSCOPIC
Bilirubin Urine: NEGATIVE
Glucose, UA: >1000 mg/dL — AB
Nitrite: NEGATIVE
Protein, ur: NEGATIVE mg/dL
Specific Gravity, Urine: 1.026 (ref 1.005–1.030)
pH: 5.5 (ref 5.0–8.0)

## 2021-07-06 MED ORDER — SODIUM CHLORIDE 0.9 % IV BOLUS
2000.0000 mL | Freq: Once | INTRAVENOUS | Status: AC
Start: 2021-07-06 — End: 2021-07-06
  Administered 2021-07-06: 18:00:00 2000 mL via INTRAVENOUS

## 2021-07-06 MED ORDER — ACETAMINOPHEN 325 MG PO TABS
650.0000 mg | ORAL_TABLET | Freq: Once | ORAL | Status: AC
  Administered 2021-07-06: 18:00:00 650 mg via ORAL
  Filled 2021-07-06: qty 2

## 2021-07-06 MED ORDER — METFORMIN HCL 500 MG PO TABS
1000.0000 mg | ORAL_TABLET | Freq: Once | ORAL | Status: AC
  Administered 2021-07-06: 18:00:00 1000 mg via ORAL
  Filled 2021-07-06: qty 2

## 2021-07-06 MED ORDER — METFORMIN HCL 1000 MG PO TABS: 1000.0000 mg | ORAL_TABLET | Freq: Every day | ORAL | 0 refills | Status: AC

## 2021-07-06 NOTE — Discharge Instructions (Signed)
Take metformin as directed   Please follow up with your primary care provider within 5-7 days for re-evaluation of your symptoms. If you do not have a primary care provider, information for a healthcare clinic has been provided for you to make arrangements for follow up care. Please return to the emergency department for any new or worsening symptoms.

## 2021-07-06 NOTE — ED Provider Notes (Signed)
[facility] EMERGENCY DEPT Provider Note   CSN: [id number] Arrival date & time: 07/06/21  1354     History  Chief Complaint  Patient presents with   Hyperglycemia    Kimberly Rollins is a 51 y.o. female.  HPI   Pt is a 51 y/o female who presents to the ED today for eval of polyuria, polydipsia that started 2 weeks ago. She further reports increased fatigue. She checked her sugar at home and it read high.   Denies fevers, chills, or infectious symptoms. Denies nv, chest pain.   She states she was on a course of prednisone about 3 weeks ago.   Home Medications Prior to Admission medications   Medication Sig Start Date End Date Taking? Authorizing Provider  metFORMIN (GLUCOPHAGE) 1000 MG tablet Take 1 tablet (1,000 mg total) by mouth daily with breakfast. 07/06/21  Yes Hafiz Irion S, PA-C  acetaminophen (TYLENOL) 325 MG tablet Take 2 tablets (650 mg total) by mouth every 6 (six) hours as needed for mild pain (or Fever >/= 101). 07/22/18   Ojie, Jude, MD  albuterol (VENTOLIN HFA) 108 (90 Base) MCG/ACT inhaler Inhale 2 puffs into the lungs every 6 (six) hours as needed for wheezing or shortness of breath. 04/27/21   Roma Schanz R, DO  amLODipine (NORVASC) 5 MG tablet TAKE 1 TABLET(5 MG) BY MOUTH DAILY 04/27/21   Carollee Herter, Alferd Apa, DO  aspirin 81 MG chewable tablet Chew 81 mg by mouth daily.    [provider]  atorvastatin (LIPITOR) 20 MG tablet 1 po qhs 04/27/21   Lowne Chase, Yvonne R, DO  fluconazole (DIFLUCAN) 100 MG tablet Take 2 tablets (200 mg total) by mouth daily for 1 day, THEN 1 tablet (100 mg total) daily for 6 days. 06/30/21 07/07/21  Gregor Hams, MD  gabapentin (NEURONTIN) 300 MG capsule Take 1 capsule (300 mg total) by mouth 3 (three) times daily. 06/16/21   Gregor Hams, MD  Multiple Vitamin (MULTIVITAMIN) tablet Take 1 tablet by mouth daily.    [provider]  predniSONE (DELTASONE) 20 MG tablet Take 1 tablet (20 mg  total) by mouth 2 (two) times daily. 06/16/21   Gregor Hams, MD      Allergies    Latex and Lisinopril    Review of Systems   Review of Systems See HPI for pertinent positives or negatives.    Physical Exam Updated Vital Signs BP (!) 157/94    Pulse 87    Temp 98.3 F (36.8 C)    Resp 18    Ht 5\' 2"  (1.575 m)    Wt 68 kg    LMP 01/12/2020    SpO2 97%    BMI 27.44 kg/m  Physical Exam Vitals and nursing note reviewed.  Constitutional:      General: She is not in acute distress.    Appearance: She is well-developed.  HENT:     Head: Normocephalic and atraumatic.  Eyes:     Conjunctiva/sclera: Conjunctivae normal.  Cardiovascular:     Rate and Rhythm: Normal rate and regular rhythm.     Heart sounds: No murmur heard. Pulmonary:     Effort: Pulmonary effort is normal. No respiratory distress.     Breath sounds: Normal breath sounds.  Abdominal:     General: Bowel sounds are normal.     Palpations: Abdomen is soft.     Tenderness: There is no abdominal tenderness. There is no guarding or rebound.  Musculoskeletal:  General: No swelling.     Cervical back: Neck supple.  Skin:    General: Skin is warm and dry.     Capillary Refill: Capillary refill takes less than 2 seconds.  Neurological:     Mental Status: She is alert.  Psychiatric:        Mood and Affect: Mood normal.    ED Results / Procedures / Treatments   Labs (all labs ordered are listed, but only abnormal results are displayed) Labs Reviewed  BASIC METABOLIC PANEL - Abnormal; Notable for the following components:      Result Value   Glucose, Bld 426 (*)    Calcium 11.4 (*)    All other components within normal limits  CBC - Abnormal; Notable for the following components:   WBC 12.5 (*)    RBC 5.32 (*)    Hemoglobin 16.5 (*)    All other components within normal limits  URINALYSIS, ROUTINE W REFLEX MICROSCOPIC - Abnormal; Notable for the following components:   Glucose, UA >1,000 (*)    Hgb urine  dipstick MODERATE (*)    Ketones, ur TRACE (*)    Leukocytes,Ua TRACE (*)    Bacteria, UA RARE (*)    All other components within normal limits  CBG MONITORING, ED - Abnormal; Notable for the following components:   Glucose-Capillary 415 (*)    All other components within normal limits  CBG MONITORING, ED - Abnormal; Notable for the following components:   Glucose-Capillary 321 (*)    All other components within normal limits  CBG MONITORING, ED - Abnormal; Notable for the following components:   Glucose-Capillary 258 (*)    All other components within normal limits    EKG None  Radiology No results found.  Procedures Procedures    Medications Ordered in ED Medications  sodium chloride 0.9 % bolus 2,000 mL (2,000 mLs Intravenous New Bag/Given 07/06/21 1801)  metFORMIN (GLUCOPHAGE) tablet 1,000 mg (1,000 mg Oral Given 07/06/21 1811)  acetaminophen (TYLENOL) tablet 650 mg (650 mg Oral Given 07/06/21 1811)    ED Course/ Medical Decision Making/ A&P                           Medical Decision Making Amount and/or Complexity of Data Reviewed Labs: ordered.  Risk OTC drugs. Prescription drug management.   51 y/o female presents for eval of polyuria, polydipsia, fatigue and hyperglycemia.  She just recently finished a course of steroids.  Reviewed/interpreted labs CBC with mild leukocytosis, elevated hemoglobin which appears consistent with prior lab values BMP with elevated blood glucose, normal CO2, no elevated anion gap.  Electrolytes are reassuring. UA with dysuria, trace ketones, no evidence for infection  51 year old female here with new onset hyperglycemia.  No prior diagnosis of diabetes.  Suspect that this is triggered by her recent use of steroids.  She was given IV fluids in the ED as well as metformin.  On recheck her CBG is improving and she reports feeling significant improvement as well. We will start her on metformin for home.  She does have a PCP and does  have good follow-up so I recommend that she follow-up within the next week.  She does not show any evidence of DKA and appears to be appropriate for discharge home.  She is comfortable with this plan.  All questions were answered.  Patient stable for discharge.    Final Clinical Impression(s) / ED Diagnoses Final diagnoses:  Hyperglycemia  Rx / DC Orders ED Discharge Orders          Ordered    metFORMIN (GLUCOPHAGE) 1000 MG tablet  Daily with breakfast        07/06/21 1940              Rodney Booze, PA-C 07/06/21 1940    Wyvonnia Dusky, MD 07/07/21 [phone]

## 2021-07-06 NOTE — ED Triage Notes (Signed)
Pt states she was recently on prednisone and her CBG has been running high. Pt reports she has had increased thirst and urination.

## 2021-07-14 DIAGNOSIS — E1165 Type 2 diabetes mellitus with hyperglycemia: Secondary | ICD-10-CM | POA: Diagnosis not present

## 2021-07-14 DIAGNOSIS — E785 Hyperlipidemia, unspecified: Secondary | ICD-10-CM | POA: Diagnosis not present

## 2021-07-14 DIAGNOSIS — I10 Essential (primary) hypertension: Secondary | ICD-10-CM | POA: Diagnosis not present

## 2021-07-14 DIAGNOSIS — E1169 Type 2 diabetes mellitus with other specified complication: Secondary | ICD-10-CM | POA: Diagnosis not present

## 2021-07-27 ENCOUNTER — Ambulatory Visit: Payer: BC Managed Care – PPO | Admitting: Family Medicine

## 2021-10-26 ENCOUNTER — Ambulatory Visit: Payer: BC Managed Care – PPO | Admitting: Family Medicine

## 2021-10-27 DIAGNOSIS — M25511 Pain in right shoulder: Secondary | ICD-10-CM | POA: Diagnosis not present

## 2021-10-27 DIAGNOSIS — I10 Essential (primary) hypertension: Secondary | ICD-10-CM | POA: Diagnosis not present

## 2021-10-27 DIAGNOSIS — E1165 Type 2 diabetes mellitus with hyperglycemia: Secondary | ICD-10-CM | POA: Diagnosis not present

## 2021-10-27 DIAGNOSIS — E1169 Type 2 diabetes mellitus with other specified complication: Secondary | ICD-10-CM | POA: Diagnosis not present

## 2022-01-03 ENCOUNTER — Other Ambulatory Visit: Payer: Self-pay | Admitting: Family Medicine

## 2022-01-03 DIAGNOSIS — Z1231 Encounter for screening mammogram for malignant neoplasm of breast: Secondary | ICD-10-CM

## 2022-02-09 ENCOUNTER — Ambulatory Visit
Admission: RE | Admit: 2022-02-09 | Discharge: 2022-02-09 | Disposition: A | Discharge status: Home / Self Care | Payer: BC Managed Care – PPO | Source: Ambulatory Visit | Attending: Family Medicine | Admitting: Family Medicine

## 2022-02-09 DIAGNOSIS — Z1231 Encounter for screening mammogram for malignant neoplasm of breast: Secondary | ICD-10-CM | POA: Diagnosis not present

## 2022-04-01 ENCOUNTER — Telehealth: Payer: Self-pay | Admitting: Pharmacist

## 2022-04-01 NOTE — Telephone Encounter (Signed)
Patient has not been seen in our office since 04/27/2021. She had appointment in May 2023 but cancelled. Also had appointment scheduled for 04/2022 which provider had to cancel but patient has not rescheduled yet.  It looks like prescriptions have been filled by a nurse practitioner Glory Buff with [facility]. Last A1c was 5.6% (10/28/2021) and was checked at [facility] office in [place].  Tied to call patient to confirm that she has been following up with a PCP recently.  Unable to reach patient. LM on VM with office contact number [phone] or my number [phone]

## 2022-04-29 ENCOUNTER — Encounter: Payer: BC Managed Care – PPO | Admitting: Family Medicine

## 2022-05-01 ENCOUNTER — Other Ambulatory Visit: Payer: Self-pay | Admitting: Family Medicine

## 2022-05-01 DIAGNOSIS — E785 Hyperlipidemia, unspecified: Secondary | ICD-10-CM

## 2022-06-08 NOTE — Progress Notes (Signed)
Ulnar neuropathy Kimberly Rollins Phone: [phone] Subjective:   Kimberly Rollins, am serving as a scribe for Dr. Hulan Saas.  I'm seeing this patient by the request  of:  Ann Held, DO  CC: right hand   [id number]  Kimberly Rollins is a 51 y.o. female coming in with complaint of R hand numbness and pain. Previously seen for R hand trigger finger of thumb and neck pain in 2020.  Patient has been seen at the beginning of this year in January by another provider for more of a cervical radiculopathy.  At that time patient did have x-rays of the cervical spine done that were independently visualized by me showing multilevel degenerative changes of the cervical spine mild to moderate in nature.  Patient states that a few weeks ago she developed pain in R shoulder and neck. Pain radiates down her arm into her hand. Pinky and ring finger are numb. Pain has been improving. Patient has tried physical therapy and is not have any relief.     Past Medical History:  Diagnosis Date   Asthma    Seasonal   Hyperlipidemia    Hypertension    Past Surgical History:  Procedure Laterality Date   BREAST REDUCTION SURGERY     CESAREAN SECTION  1998   REDUCTION MAMMAPLASTY Bilateral    Social History   Socioeconomic History   Marital status: Single    Spouse name: Not on file   Number of children: Not on file   Years of education: Not on file   Highest education level: Not on file  Occupational History   Occupation: [employer]--ns[employer] home    Employer: [employer]  Tobacco Use   Smoking status: Every Day    Packs/day: 1.00    Years: 15.00    Total pack years: 15.00    Types: Cigarettes   Smokeless tobacco: Never   Tobacco comments:    program at work--pt will try-- trying to cut down  Vaping Use   Vaping Use: Never used  Substance and Sexual Activity   Alcohol use: Yes    Alcohol/week: 0.0 standard drinks  of alcohol    Comment: Socially   Drug use: Rollins   Sexual activity: Yes    Partners: Female    Birth control/protection: Surgical    Comment: partner surgical  Other Topics Concern   Not on file  Social History Narrative   Exercising-- walking some    Social Determinants of Health   Financial Resource Strain: Not on file  Food Insecurity: Not on file  Transportation Needs: Not on file  Physical Activity: Not on file  Stress: Not on file  Social Connections: Not on file   Allergies  Allergen Reactions   Latex Itching and Rash   Lisinopril Anxiety   Family History  Problem Relation Age of Onset   Hypertension Father    Diabetes Father    COPD Mother    Hypertension Brother    Healthy Daughter    Breast cancer Neg Hx     Current Outpatient Medications (Endocrine & Metabolic):    metFORMIN (GLUCOPHAGE) 1000 MG tablet, Take 1 tablet (1,000 mg total) by mouth daily with breakfast.   predniSONE (DELTASONE) 20 MG tablet, Take 1 tablet (20 mg total) by mouth 2 (two) times daily.  Current Outpatient Medications (Cardiovascular):    amLODipine (NORVASC) 5 MG tablet, TAKE 1 TABLET(5 MG) BY MOUTH DAILY  atorvastatin (LIPITOR) 20 MG tablet, TAKE 1 TABLET BY MOUTH EVERY NIGHT AT BEDTIME. Pt needs office visit for further refills  Current Outpatient Medications (Respiratory):    albuterol (VENTOLIN HFA) 108 (90 Base) MCG/ACT inhaler, Inhale 2 puffs into the lungs every 6 (six) hours as needed for wheezing or shortness of breath.  Current Outpatient Medications (Analgesics):    acetaminophen (TYLENOL) 325 MG tablet, Take 2 tablets (650 mg total) by mouth every 6 (six) hours as needed for mild pain (or Fever >/= 101).   aspirin 81 MG chewable tablet, Chew 81 mg by mouth daily.   Current Outpatient Medications (Other):    gabapentin (NEURONTIN) 100 MG capsule, Take 1 capsule (100 mg total) by mouth at bedtime.   Multiple Vitamin (MULTIVITAMIN) tablet, Take 1 tablet by mouth  daily.   Reviewed prior external information including notes and imaging from  primary care provider As well as notes that were available from care everywhere and other healthcare systems.  Past medical history, social, surgical and family history all reviewed in electronic medical record.  Rollins pertanent information unless stated regarding to the chief complaint.   Review of Systems:  Rollins headache, visual changes, nausea, vomiting, diarrhea, constipation, dizziness, abdominal pain, skin rash, fevers, chills, night sweats, weight loss, swollen lymph nodes, body aches, joint swelling, chest pain, shortness of breath, mood changes. POSITIVE muscle aches  Objective  Blood pressure (!) 138/94, pulse 80, height 5\' 2"  (1.575 m), weight 142 lb (64.4 kg), last menstrual period 01/12/2020, SpO2 99 %.   General: Rollins apparent distress alert and oriented x3 mood and affect normal, dressed appropriately.  HEENT: Pupils equal, extraocular movements intact  Respiratory: Patient's speak in full sentences and does not appear short of breath  Cardiovascular: Rollins lower extremity edema, non tender, Rollins erythema  Hand exam shows weakness noted in the C8 distribution.  Positive Tinel's at the elbow.  Patient does have pain with the neck but does not have worsening pain with Spurling's at the neck  Limited muscular skeletal ultrasound was performed and interpreted by 03/13/2020, M  Hypoechoic changes with increased diameter of the ulnar nerve at the elbow.  Does seem to have subluxation noted as well. Impression: Chronic ulnar neuropathy  Procedure: Real-time Ultrasound Guided Injection of left ulnar nerve sheath Device: GE Logiq Q7 Ultrasound guided injection is preferred based studies that show increased duration, increased effect, greater accuracy, decreased procedural pain, increased response rate, and decreased cost with ultrasound guided versus blind injection.  Verbal informed consent obtained.  Time-out  conducted.  Noted Rollins overlying erythema, induration, or other signs of local infection.  Skin prepped in a sterile fashion.  Local anesthesia: Topical Ethyl chloride.  With sterile technique and under real time ultrasound guidance: With a 25-gauge half inch needle injected with 0.5 cc of 0.5% Marcaine and 0.5 cc of Kenalog 40 mg/mL Completed without difficulty  Pain immediately improved suggesting accurate placement of the medication.  Advised to call if fevers/chills, erythema, induration, drainage, or persistent bleeding.  Impression: Technically successful ultrasound guided injection.    Impression and Recommendations:    The above documentation has been reviewed and is accurate and complete Antoine Primas, DO

## 2022-06-10 ENCOUNTER — Encounter: Payer: Self-pay | Admitting: Family Medicine

## 2022-06-10 ENCOUNTER — Ambulatory Visit: Payer: BC Managed Care – PPO | Admitting: Family Medicine

## 2022-06-10 ENCOUNTER — Ambulatory Visit: Payer: Self-pay

## 2022-06-10 VITALS — BP 138/94 | HR 80 | Ht 62.0 in | Wt 142.0 lb

## 2022-06-10 DIAGNOSIS — M79641 Pain in right hand: Secondary | ICD-10-CM | POA: Diagnosis not present

## 2022-06-10 DIAGNOSIS — G5621 Lesion of ulnar nerve, right upper limb: Secondary | ICD-10-CM

## 2022-06-10 MED ORDER — GABAPENTIN 100 MG PO CAPS: 100.0000 mg | ORAL_CAPSULE | Freq: Every day | ORAL | 0 refills | Status: DC

## 2022-06-10 NOTE — Assessment & Plan Note (Signed)
Patient was having significant pain and wanted to try an injection.  Discussed with patient icing regimen and home exercises.  Discussed the potential for nerve conduction studies we also discussed in the differential there is a good chance for cervical radiculopathy with the degenerative disc disease of the neck.  Weakness noted in this C8 distribution.  Patient elected to try the injection to see how she would respond.  Did it under ultrasound guidance and hopeful that this will make a difference.  If not we will consider an MRI of the neck or nerve conduction study has done formal physical therapy over the years for relatively similar problems.  Follow-up again in 6 to 8 weeks

## 2022-06-10 NOTE — Patient Instructions (Addendum)
Injected ulnar nerve today Compression sleeve Gabapentin 100mg  at night See me again in 5-6 weeks

## 2022-07-06 ENCOUNTER — Other Ambulatory Visit: Payer: Self-pay | Admitting: Family Medicine

## 2022-07-06 DIAGNOSIS — I10 Essential (primary) hypertension: Secondary | ICD-10-CM

## 2022-07-12 NOTE — Progress Notes (Unsigned)
Kimberly Rollins [address] [place] Phone: [phone] Subjective:   IVilma Rollins, am serving as a scribe for Dr. Hulan Saas.  I'm seeing this patient by the request  of:  Ann Held, DO  CC: Neck pain, right arm pain  LKG:[id number]  06/10/2022 Patient was having significant pain and wanted to try an injection. Discussed with patient icing regimen and home exercises. Discussed the potential for nerve conduction studies we also discussed in the differential there is a good chance for cervical radiculopathy with the degenerative disc disease of the neck. Weakness noted in this C8 distribution. Patient elected to try the injection to see how she would respond. Did it under ultrasound guidance and hopeful that this will make a difference. If not we will consider an MRI of the neck or nerve conduction study has done formal physical therapy over the years for relatively similar problems. Follow-up again in 6 to 8 weeks   Update 07/14/2022 Kimberly Rollins is a 52 y.o. female coming in with complaint of R hand numbness.  Patient was seen previously and had more than ulnar neuropathy of the left side.  This was 5 weeks ago.  Patient states feeling is worse than before. Having new burning pain in R forearm with pressure. Started taking gabapentin, it's helping a little. Pain still persist in traps, scapula, and lats.       Past Medical History:  Diagnosis Date   Asthma    Seasonal   Hyperlipidemia    Hypertension    Past Surgical History:  Procedure Laterality Date   BREAST REDUCTION SURGERY     CESAREAN SECTION  1998   REDUCTION MAMMAPLASTY Bilateral    Social History   Socioeconomic History   Marital status: Single    Spouse name: Not on file   Number of children: Not on file   Years of education: Not on file   Highest education level: Not on file  Occupational History   Occupation: [employer]--ns[employer] home    Employer:  [employer]  Tobacco Use   Smoking status: Every Day    Packs/day: 1.00    Years: 15.00    Total pack years: 15.00    Types: Cigarettes   Smokeless tobacco: Never   Tobacco comments:    program at work--pt will try-- trying to cut down  Vaping Use   Vaping Use: Never used  Substance and Sexual Activity   Alcohol use: Yes    Alcohol/week: 0.0 standard drinks of alcohol    Comment: Socially   Drug use: No   Sexual activity: Yes    Partners: Female    Birth control/protection: Surgical    Comment: partner surgical  Other Topics Concern   Not on file  Social History Narrative   Exercising-- walking some    Social Determinants of Health   Financial Resource Strain: Not on file  Food Insecurity: Not on file  Transportation Needs: Not on file  Physical Activity: Not on file  Stress: Not on file  Social Connections: Not on file   Allergies  Allergen Reactions   Latex Itching and Rash   Lisinopril Anxiety   Family History  Problem Relation Age of Onset   Hypertension Father    Diabetes Father    COPD Mother    Hypertension Brother    Healthy Daughter    Breast cancer Neg Hx     Current Outpatient Medications (Endocrine & Metabolic):  metFORMIN (GLUCOPHAGE) 1000 MG tablet, Take 1 tablet (1,000 mg total) by mouth daily with breakfast.   predniSONE (DELTASONE) 20 MG tablet, Take 1 tablet (20 mg total) by mouth 2 (two) times daily.  Current Outpatient Medications (Cardiovascular):    amLODipine (NORVASC) 5 MG tablet, TAKE 1 TABLET(5 MG) BY MOUTH DAILY. Pt needs office visit for further refills.   atorvastatin (LIPITOR) 20 MG tablet, TAKE 1 TABLET BY MOUTH EVERY NIGHT AT BEDTIME. Pt needs office visit for further refills  Current Outpatient Medications (Respiratory):    albuterol (VENTOLIN HFA) 108 (90 Base) MCG/ACT inhaler, Inhale 2 puffs into the lungs every 6 (six) hours as needed for wheezing or shortness of breath.  Current Outpatient Medications (Analgesics):     acetaminophen (TYLENOL) 325 MG tablet, Take 2 tablets (650 mg total) by mouth every 6 (six) hours as needed for mild pain (or Fever >/= 101).   aspirin 81 MG chewable tablet, Chew 81 mg by mouth daily.   Current Outpatient Medications (Other):    gabapentin (NEURONTIN) 100 MG capsule, Take 1 capsule (100 mg total) by mouth at bedtime.   Multiple Vitamin (MULTIVITAMIN) tablet, Take 1 tablet by mouth daily.   Reviewed prior external information including notes and imaging from  primary care provider As well as notes that were available from care everywhere and other healthcare systems.  Past medical history, social, surgical and family history all reviewed in electronic medical record.  No pertanent information unless stated regarding to the chief complaint.   Review of Systems:  No headache, visual changes, nausea, vomiting, diarrhea, constipation, dizziness, abdominal pain, skin rash, fevers, chills, night sweats, weight loss, swollen lymph nodes, body aches, joint swelling, chest pain, shortness of breath, mood changes. POSITIVE muscle aches  Objective  Blood pressure 124/88, pulse (!) 105, height 5\' 2"  (1.575 m), weight 141 lb (64 kg), last menstrual period 01/12/2020, SpO2 99 %.   General: No apparent distress alert and oriented x3 mood and affect normal, dressed appropriately.  HEENT: Pupils equal, extraocular movements intact  Respiratory: Patient's speak in full sentences and does not appear short of breath  Cardiovascular: No lower extremity edema, non tender, no erythema   Right arm exam shows the patient does still have some mild discomfort over the ulnar nerve but significantly improved from previous exam.  Not having as much pain with extension of the arm anymore.  Patient has relatively good grip strength.  Severely positive though Spurling's test noted today.  Limited muscular skeletal ultrasound was performed and interpreted by Hulan Saas, M   Limited ultrasound of  patient's right elbow shows that the ulnar nerve sheath significant decrease in the hypoechoic changes noted. Impression: Improvement     Impression and Recommendations:      The above documentation has been reviewed and is accurate and complete Lyndal Pulley, DO

## 2022-07-14 ENCOUNTER — Ambulatory Visit (INDEPENDENT_AMBULATORY_CARE_PROVIDER_SITE_OTHER): Payer: BC Managed Care – PPO

## 2022-07-14 ENCOUNTER — Ambulatory Visit: Payer: BC Managed Care – PPO | Admitting: Family Medicine

## 2022-07-14 ENCOUNTER — Ambulatory Visit: Payer: Self-pay

## 2022-07-14 VITALS — BP 124/88 | HR 105 | Ht 62.0 in | Wt 141.0 lb

## 2022-07-14 DIAGNOSIS — M65311 Trigger thumb, right thumb: Secondary | ICD-10-CM

## 2022-07-14 DIAGNOSIS — M542 Cervicalgia: Secondary | ICD-10-CM | POA: Diagnosis not present

## 2022-07-14 DIAGNOSIS — M5412 Radiculopathy, cervical region: Secondary | ICD-10-CM

## 2022-07-14 MED ORDER — KETOROLAC TROMETHAMINE 60 MG/2ML IM SOLN
60.0000 mg | Freq: Once | INTRAMUSCULAR | Status: AC
  Administered 2022-07-14: 60 mg via INTRAMUSCULAR

## 2022-07-14 MED ORDER — METHYLPREDNISOLONE ACETATE 80 MG/ML IJ SUSP
80.0000 mg | Freq: Once | INTRAMUSCULAR | Status: AC
  Administered 2022-07-14: 80 mg via INTRAMUSCULAR

## 2022-07-14 NOTE — Patient Instructions (Addendum)
Nerve Conduction Study Xray today  [place] [phone] Call Today  When we receive your results we will contact you.

## 2022-07-19 ENCOUNTER — Ambulatory Visit
Admission: RE | Admit: 2022-07-19 | Discharge: 2022-07-19 | Disposition: A | Discharge status: Home / Self Care | Payer: BC Managed Care – PPO | Source: Ambulatory Visit | Attending: Family Medicine | Admitting: Family Medicine

## 2022-07-19 ENCOUNTER — Other Ambulatory Visit: Payer: Self-pay

## 2022-07-19 ENCOUNTER — Encounter: Payer: Self-pay | Admitting: Family Medicine

## 2022-07-19 DIAGNOSIS — M5412 Radiculopathy, cervical region: Secondary | ICD-10-CM

## 2022-07-19 DIAGNOSIS — M542 Cervicalgia: Secondary | ICD-10-CM

## 2022-07-28 ENCOUNTER — Ambulatory Visit
Admission: RE | Admit: 2022-07-28 | Discharge: 2022-07-28 | Disposition: A | Discharge status: Home / Self Care | Payer: BC Managed Care – PPO | Source: Ambulatory Visit | Attending: Family Medicine | Admitting: Family Medicine

## 2022-07-28 DIAGNOSIS — M5412 Radiculopathy, cervical region: Secondary | ICD-10-CM

## 2022-07-28 MED ORDER — IOPAMIDOL (ISOVUE-M 300) INJECTION 61%
1.0000 mL | Freq: Once | INTRAMUSCULAR | Status: AC | PRN
  Administered 2022-07-28: 1 mL via EPIDURAL

## 2022-07-28 MED ORDER — TRIAMCINOLONE ACETONIDE 40 MG/ML IJ SUSP (RADIOLOGY)
60.0000 mg | Freq: Once | INTRAMUSCULAR | Status: AC
  Administered 2022-07-28: 60 mg via EPIDURAL

## 2022-07-28 NOTE — Discharge Instructions (Signed)

## 2022-09-16 ENCOUNTER — Ambulatory Visit: Payer: BC Managed Care – PPO | Admitting: Family Medicine

## 2022-10-17 NOTE — Progress Notes (Deleted)
[facility] [address] [place] [address] Phone: [phone] Subjective:    I'm seeing this patient by the request  of:  Donato Schultz, DO  CC:   [id number]  07/14/2022 Seen for cervical radiculopathy.  Kimberly Rollins is a 52 y.o. female coming in with complaint of cervical radiculopathy. Epidural 07/28/2022.  Onset-  Location Duration-  Character- Aggravating factors- Reliving factors-  Therapies tried-  Severity-     Past Medical History:  Diagnosis Date   Asthma    Seasonal   Hyperlipidemia    Hypertension    Past Surgical History:  Procedure Laterality Date   BREAST REDUCTION SURGERY     CESAREAN SECTION  1998   REDUCTION MAMMAPLASTY Bilateral    Social History   Socioeconomic History   Marital status: Single    Spouse name: Not on file   Number of children: Not on file   Years of education: Not on file   Highest education level: Not on file  Occupational History   Occupation: [employer]--[employer] home    Employer: [employer]  Tobacco Use   Smoking status: Every Day    Packs/day: 1.00    Years: 15.00    Additional pack years: 0.00    Total pack years: 15.00    Types: Cigarettes   Smokeless tobacco: Never   Tobacco comments:    program at work--pt will try-- trying to cut down  Vaping Use   Vaping Use: Never used  Substance and Sexual Activity   Alcohol use: Yes    Alcohol/week: 0.0 standard drinks of alcohol    Comment: Socially   Drug use: No   Sexual activity: Yes    Partners: Female    Birth control/protection: Surgical    Comment: partner surgical  Other Topics Concern   Not on file  Social History Narrative   Exercising-- walking some    Social Determinants of Health   Financial Resource Strain: Not on file  Food Insecurity: Not on file  Transportation Needs: Not on file  Physical Activity: Not on file  Stress: Not on file  Social Connections: Not on file   Allergies   Allergen Reactions   Latex Itching and Rash   Lisinopril Anxiety   Family History  Problem Relation Age of Onset   Hypertension Father    Diabetes Father    COPD Mother    Hypertension Brother    Healthy Daughter    Breast cancer Neg Hx     Current Outpatient Medications (Endocrine & Metabolic):    metFORMIN (GLUCOPHAGE) 1000 MG tablet, Take 1 tablet (1,000 mg total) by mouth daily with breakfast.   predniSONE (DELTASONE) 20 MG tablet, Take 1 tablet (20 mg total) by mouth 2 (two) times daily.  Current Outpatient Medications (Cardiovascular):    amLODipine (NORVASC) 5 MG tablet, TAKE 1 TABLET(5 MG) BY MOUTH DAILY. Pt needs office visit for further refills.   atorvastatin (LIPITOR) 20 MG tablet, TAKE 1 TABLET BY MOUTH EVERY NIGHT AT BEDTIME. Pt needs office visit for further refills  Current Outpatient Medications (Respiratory):    albuterol (VENTOLIN HFA) 108 (90 Base) MCG/ACT inhaler, Inhale 2 puffs into the lungs every 6 (six) hours as needed for wheezing or shortness of breath.  Current Outpatient Medications (Analgesics):    acetaminophen (TYLENOL) 325 MG tablet, Take 2 tablets (650 mg total) by mouth every 6 (six) hours as needed for mild pain (or Fever >/= 101).   aspirin 81  MG chewable tablet, Chew 81 mg by mouth daily.   Current Outpatient Medications (Other):    gabapentin (NEURONTIN) 100 MG capsule, Take 1 capsule (100 mg total) by mouth at bedtime.   Multiple Vitamin (MULTIVITAMIN) tablet, Take 1 tablet by mouth daily.   Reviewed prior external information including notes and imaging from  primary care provider As well as notes that were available from care everywhere and other healthcare systems.  Past medical history, social, surgical and family history all reviewed in electronic medical record.  No pertanent information unless stated regarding to the chief complaint.   Review of Systems:  No headache, visual changes, nausea, vomiting, diarrhea, constipation,  dizziness, abdominal pain, skin rash, fevers, chills, night sweats, weight loss, swollen lymph nodes, body aches, joint swelling, chest pain, shortness of breath, mood changes. POSITIVE muscle aches  Objective  Last menstrual period 01/12/2020.   General: No apparent distress alert and oriented x3 mood and affect normal, dressed appropriately.  HEENT: Pupils equal, extraocular movements intact  Respiratory: Patient's speak in full sentences and does not appear short of breath  Cardiovascular: No lower extremity edema, non tender, no erythema      Impression and Recommendations:

## 2022-10-18 ENCOUNTER — Ambulatory Visit: Payer: BC Managed Care – PPO | Admitting: Family Medicine

## 2022-12-21 ENCOUNTER — Other Ambulatory Visit: Payer: Self-pay

## 2022-12-21 ENCOUNTER — Telehealth: Payer: Self-pay | Admitting: Family Medicine

## 2022-12-21 ENCOUNTER — Encounter: Payer: Self-pay | Admitting: Neurology

## 2022-12-21 DIAGNOSIS — R202 Paresthesia of skin: Secondary | ICD-10-CM

## 2022-12-21 DIAGNOSIS — M79601 Pain in right arm: Secondary | ICD-10-CM

## 2022-12-21 NOTE — Telephone Encounter (Signed)
Pt would like to re-referred to [facility]. We referred in February, but she decided not to schedule and did an epidural. She is having trouble again and would like to see Neuro but they need a new referral.

## 2022-12-21 NOTE — Telephone Encounter (Signed)
Referral placed and patient notified. 

## 2022-12-29 ENCOUNTER — Ambulatory Visit: Payer: BC Managed Care – PPO | Admitting: Neurology

## 2022-12-29 ENCOUNTER — Encounter: Payer: Self-pay | Admitting: Family Medicine

## 2022-12-29 DIAGNOSIS — R202 Paresthesia of skin: Secondary | ICD-10-CM | POA: Diagnosis not present

## 2022-12-29 DIAGNOSIS — M5412 Radiculopathy, cervical region: Secondary | ICD-10-CM

## 2022-12-29 NOTE — Procedures (Signed)
[facility]  [address] [place], [address]  [place], [place] [address] Tel: [phone] Fax: [phone] Test Date:  12/29/2022  Patient: Kimberly Rollins DOB: [birth year] Physician: Nita Sickle, DO  Sex: Female Height: 5\' 2"  Ref Phys: Antoine Primas, DO  ID#: [id number]   Technician:    History: This is a 52 year old female referred for evaluation of right arm weakness.  NCV & EMG Findings: Extensive electrodiagnostic testing of the right upper extremity and additional studies of the left shows:  Right median and ulnar sensory responses are within normal limits.  Right mixed palmar sensory responses show mildly prolonged latency. Right median and ulnar motor response is within normal limits. Chronic motor axon loss changes are seen affecting the C5, C6, C7, and C8 myotomes bilaterally.  Fibrillation potentials are seen in the right dorsal interosseous muscle cervical paraspinal muscles.  Impression: Active on chronic multilevel radiculopathies affecting bilateral C5, C6, C7, and C8 nerve root/segments.  Overall, these findings are severe in degree electrically and worse at the right C8 and left C5 nerve roots. Right median neuropathy at or distal to the wrist, consistent with a clinical diagnosis of carpal tunnel syndrome.  Overall, these findings are very mild in degree electrically.  ___________________________ Nita Sickle, DO    Nerve Conduction Studies   Stim Site NR Peak (ms) Norm Peak (ms) O-P Amp (V) Norm O-P Amp  Right Median Anti Sensory (2nd Digit)  32 C  Wrist    3.4 <3.6 47.3 >15  Right Ulnar Anti Sensory (5th Digit)  32 C  Wrist    2.6 <3.1 36.8 >10     Stim Site NR Onset (ms) Norm Onset (ms) O-P Amp (mV) Norm O-P Amp Site1 Site2 Delta-0 (ms) Dist (cm) Vel (m/s) Norm Vel (m/s)  Right Median Motor (Abd Poll Brev)  32 C  Wrist    3.2 <4.0 10.8 >6 Elbow Wrist 4.7 24.0 51 >50  Elbow    7.9  9.9         Right Ulnar Motor (Abd Dig Minimi)  32 C   Wrist    2.8 <3.1 7.8 >7 B Elbow Wrist 3.4 19.0 56 >50  B Elbow    6.2  7.1  A Elbow B Elbow 1.8 10.0 56 >50  A Elbow    8.0  6.0            Stim Site NR Peak (ms) Norm Peak (ms) P-T Amp (V) Site1 Site2 Delta-P (ms) Norm Delta (ms)  Right Median/Ulnar Palm Comparison (Wrist - 8cm)  32 C  Median Palm    2.0 <2.2 39.1 Median Palm Ulnar Palm *0.7   Ulnar Palm    1.3 <2.2 27.5       Electromyography   Side Muscle Ins.Act Fibs Fasc Recrt Amp Dur Poly Activation Comment  Right 1stDorInt Nml *1+ Nml *3- *1+ *1+ *1+ Nml N/A  Right Abd Poll Brev Nml Nml Nml Nml Nml Nml Nml Nml N/A  Right PronatorTeres Nml Nml Nml *3- *1+ *1+ *1+ Nml N/A  Right Biceps Nml Nml Nml *2- *1+ *1+ *1+ Nml N/A  Right Triceps Nml Nml Nml *3- *1+ *1+ *1+ Nml N/A  Right Deltoid Nml Nml Nml *1- *1+ *1+ *1+ Nml N/A  Right Cervical Parasp Low Nml *1+ Nml Nml *- *- *- *N.E. N/A  Right Infraspinatus Nml Nml Nml Nml Nml Nml Nml Nml N/A  Left PronatorTeres Nml Nml Nml *3- *1+ *1+ *1+ Nml N/A  Left Biceps Nml Nml  Nml *2- *1+ *1+ *1+ Nml N/A  Left Deltoid Nml Nml Nml *3- *1+ *1+ *1+ Nml N/A  Left Infraspinatus Nml Nml Nml *3- *1+ *1+ *1+ Nml N/A  Left Triceps Nml Nml Nml *3- *1+ *1+ *1+ Nml N/A  Left 1stDorInt Nml Nml Nml *1- *1+ *1+ *1+ Nml N/A      Waveforms:

## 2023-01-03 ENCOUNTER — Other Ambulatory Visit: Payer: Self-pay

## 2023-01-03 DIAGNOSIS — M5412 Radiculopathy, cervical region: Secondary | ICD-10-CM

## 2023-01-16 ENCOUNTER — Other Ambulatory Visit: Payer: Self-pay | Admitting: Family Medicine

## 2023-01-16 DIAGNOSIS — Z1231 Encounter for screening mammogram for malignant neoplasm of breast: Secondary | ICD-10-CM

## 2023-02-07 DIAGNOSIS — Z1231 Encounter for screening mammogram for malignant neoplasm of breast: Secondary | ICD-10-CM

## 2023-02-14 ENCOUNTER — Ambulatory Visit
Admission: RE | Admit: 2023-02-14 | Discharge: 2023-02-14 | Disposition: A | Discharge status: Home / Self Care | Payer: BC Managed Care – PPO | Source: Ambulatory Visit

## 2023-02-14 DIAGNOSIS — Z1231 Encounter for screening mammogram for malignant neoplasm of breast: Secondary | ICD-10-CM

## 2023-03-12 ENCOUNTER — Other Ambulatory Visit: Payer: Self-pay | Admitting: Family Medicine

## 2023-05-02 ENCOUNTER — Telehealth: Payer: Self-pay | Admitting: Family Medicine

## 2023-05-02 NOTE — Telephone Encounter (Signed)
Patient called asking if another epidural could be ordered for her? She said that it was helpful last time but has worn off. She is trying to avoid surgery as long as possible at this point.  Please advise.

## 2023-05-03 ENCOUNTER — Other Ambulatory Visit: Payer: Self-pay

## 2023-05-03 DIAGNOSIS — M5412 Radiculopathy, cervical region: Secondary | ICD-10-CM

## 2023-05-19 NOTE — Discharge Instructions (Signed)

## 2023-05-22 ENCOUNTER — Inpatient Hospital Stay
Admission: RE | Admit: 2023-05-22 | Discharge: 2023-05-22 | Disposition: A | Discharge status: Home / Self Care | Payer: BC Managed Care – PPO | Source: Ambulatory Visit | Attending: Family Medicine | Admitting: Family Medicine

## 2023-05-22 DIAGNOSIS — M5412 Radiculopathy, cervical region: Secondary | ICD-10-CM

## 2023-05-22 MED ORDER — TRIAMCINOLONE ACETONIDE 40 MG/ML IJ SUSP (RADIOLOGY)
60.0000 mg | Freq: Once | INTRAMUSCULAR | Status: AC
  Administered 2023-05-22: 60 mg via EPIDURAL

## 2023-05-22 MED ORDER — IOPAMIDOL (ISOVUE-M 300) INJECTION 61%
1.0000 mL | Freq: Once | INTRAMUSCULAR | Status: AC
  Administered 2023-05-22: 1 mL via EPIDURAL

## 2023-09-15 ENCOUNTER — Ambulatory Visit: Admitting: Podiatry

## 2023-09-15 ENCOUNTER — Ambulatory Visit (INDEPENDENT_AMBULATORY_CARE_PROVIDER_SITE_OTHER)

## 2023-09-15 ENCOUNTER — Encounter: Payer: Self-pay | Admitting: Podiatry

## 2023-09-15 DIAGNOSIS — M216X1 Other acquired deformities of right foot: Secondary | ICD-10-CM

## 2023-09-15 DIAGNOSIS — M722 Plantar fascial fibromatosis: Secondary | ICD-10-CM

## 2023-09-15 MED ORDER — TRIAMCINOLONE ACETONIDE 10 MG/ML IJ SUSP
10.0000 mg | Freq: Once | INTRAMUSCULAR | Status: AC
  Administered 2023-09-15: 10 mg

## 2023-09-15 MED ORDER — MELOXICAM 15 MG PO TABS: 15.0000 mg | ORAL_TABLET | Freq: Every day | ORAL | 0 refills | Status: AC

## 2023-09-15 NOTE — Patient Instructions (Signed)

## 2023-09-15 NOTE — Progress Notes (Signed)
 Chief Complaint  Patient presents with   Foot Pain    "I have heel pain on my right foot." N - heel pain L - plantar, medial, lateral heel D - 6 weeks O - off and on C - sharp pain, sore, aching A - walking, standing, get up in the morning T - soaked in Epsom Salt, Ibuprofen, heel cups    HPI: 53 y.o. female presenting today with c/o pain in the bottom of the right heel.  Pain has been pretty severe.  She has been dealing with it for about 6 weeks.  She locates it to the plantar medial heel extending into the arch and somewhat to the lateral hindfoot.  She states she is dealt with plantar fasciitis before years ago.  Past Medical History:  Diagnosis Date   Asthma    Seasonal   Hyperlipidemia    Hypertension     Past Surgical History:  Procedure Laterality Date   BREAST REDUCTION SURGERY     CESAREAN SECTION  1998   REDUCTION MAMMAPLASTY Bilateral     Allergies  Allergen Reactions   Latex Itching and Rash   Lisinopril Anxiety     Physical Exam: General: The patient is alert and oriented x3 in no acute distress.  Dermatology:  No ecchymosis, erythema, or edema bilateral.  No open lesions.    Vascular: Palpable pedal pulses bilaterally. Capillary refill within normal limits.  No appreciable edema.    Neurological: Light touch sensation intact bilateral.  MMT 5/5 to lower extremity bilateral. Negative Tinel's sign with percussion of the posterior tibial nerve on the affected extremity.    Musculoskeletal Exam:  There is pain on palpation of the plantarmedial & plantarcentral aspect of right heel.  No gaps or nodules within the plantar fascia.  Positive Windlass mechanism bilateral.  Antalgic gait noted with first few steps upon standing.  No pain on palpation of achilles tendon bilateral.  Ankle df less than 10 degrees with knee extended b/l.  Radiographic Exam: Right foot 09/15/23 3 views weightbearing Normal osseous mineralization. Joint spaces preserved.  No  fractures or osseous irregularities noted.  Plantar and posterior heel spur noted.  Thickening of plantar fascia noted.  Assessment/Plan of Care: 1. Plantar fasciitis   2. Acquired equinus deformity of right foot     Meds ordered this encounter  Medications   triamcinolone acetonide (KENALOG) 10 MG/ML injection 10 mg   meloxicam (MOBIC) 15 MG tablet    Sig: Take 1 tablet (15 mg total) by mouth daily.    Dispense:  30 tablet    Refill:  0   None Radiographs reviewed with patient.  -Reviewed etiology of plantar fasciitis with patient.  Discussed treatment options with patient today, including cortisone injection, NSAID course of treatment, stretching exercises, physical therapy, use of night splint, rest, icing the heel, arch supports/orthotics, and supportive shoe gear.  Plantar fascial brace dispensed today.  With the patient's verbal consent, a corticosteroid injection was administered to the right heel, consisting of a mixture of 1% lidocaine plain, 0.5% Sensorcaine plain, and Kenalog-10 for a total of 2.0 cc administered.  A Band-aid was applied.  Patient tolerated this well.  Rx sent in for oral meloxicam to be taken morning time.  Return in about 2 weeks (around 09/29/2023) for Plantar Fasciitis.   Carrel Leather L. Marchia Bond, AACFAS [facility]     [address]  [place], [place] [address]                Office [phone]  Fax [phone]

## 2023-10-13 ENCOUNTER — Ambulatory Visit: Admitting: Podiatry

## 2023-10-26 ENCOUNTER — Ambulatory Visit: Admitting: Podiatry

## 2023-10-26 ENCOUNTER — Encounter: Payer: Self-pay | Admitting: Podiatry

## 2023-10-26 VITALS — Ht 62.0 in | Wt 141.0 lb

## 2023-10-26 DIAGNOSIS — M79671 Pain in right foot: Secondary | ICD-10-CM | POA: Diagnosis not present

## 2023-10-26 DIAGNOSIS — G8929 Other chronic pain: Secondary | ICD-10-CM | POA: Diagnosis not present

## 2023-10-26 DIAGNOSIS — M722 Plantar fascial fibromatosis: Secondary | ICD-10-CM | POA: Diagnosis not present

## 2023-10-26 NOTE — Progress Notes (Signed)
 Chief Complaint  Patient presents with   Plantar Fasciitis    Pt is here to f/u on right heel pain due to plantar fasciitis pt states the pain has not gotten any better, states that she does everything as instructed and still can't get any relief.    HPI: 53 y.o. female presenting today following up for pain in the bottom of the right heel.  Pain has been pretty severe.  She has been dealing with it for about 6 weeks.  Last visit she did have corticosteroid injection which provided minimal short-term relief. She locates it to the plantar medial heel extending into the arch and somewhat to the lateral hindfoot.  She states she is dealt with plantar fasciitis before years ago.  Past Medical History:  Diagnosis Date   Asthma    Seasonal   Hyperlipidemia    Hypertension     Past Surgical History:  Procedure Laterality Date   BREAST REDUCTION SURGERY     CESAREAN SECTION  1998   REDUCTION MAMMAPLASTY Bilateral     Allergies  Allergen Reactions   Latex Itching and Rash   Lisinopril  Anxiety     Physical Exam: General: The patient is alert and oriented x3 in no acute distress.  Dermatology:  No ecchymosis, erythema, or edema bilateral.  No open lesions.    Vascular: Palpable pedal pulses bilaterally. Capillary refill within normal limits.  No appreciable edema.    Neurological: Light touch sensation intact bilateral.  MMT 5/5 to lower extremity bilateral. Negative Tinel's sign with percussion of the posterior tibial nerve on the affected extremity.    Musculoskeletal Exam:  There is pain on palpation of the plantarmedial & plantarcentral aspect of right heel.  There is also increased pain with calcaneal squeeze. No gaps or nodules within the plantar fascia.  Positive Windlass mechanism bilateral.  Antalgic gait noted with first few steps upon standing.  No pain on palpation of achilles tendon bilateral.  Ankle df less than 10 degrees with knee extended b/l.  Radiographic  Exam: Right foot 09/15/23 3 views weightbearing Normal osseous mineralization. Joint spaces preserved.  No fractures or osseous irregularities noted.  Plantar and posterior heel spur noted.  Thickening of plantar fascia noted.  Assessment/Plan of Care: 1. Chronic heel pain, right   2. Plantar fasciitis     No orders of the defined types were placed in this encounter.  MR ANKLE RIGHT W WO CONTRAST  Minimal relief from last visit.  Does have increased pain with calcaneal squeeze.  Ordering MRI to further workup etiology of the heel pain, concern for possible calcaneal stress fracture.  For now, patient fitted with cam walker boot.  Dispensed to the patient.  Maintain cam walker with all weightbearing activity.  Recommend use of an even up for contralateral side.  Encouraged the patient to continue with stretching regimen in the interim.  Holding further NSAIDs in the event there is osseous pathology.  Return in about 3 weeks (around 11/16/2023) for MRI results/heel pain.   Ellijah Leffel L. Lunda Salines, AACFAS [facility]     [address], [place] [address]                Office [phone])  [phone]  Fax [phone]

## 2023-10-27 ENCOUNTER — Encounter: Payer: Self-pay | Admitting: Podiatry

## 2023-11-16 ENCOUNTER — Ambulatory Visit
Admission: RE | Admit: 2023-11-16 | Discharge: 2023-11-16 | Disposition: A | Discharge status: Home / Self Care | Source: Ambulatory Visit | Attending: Podiatry

## 2023-11-16 DIAGNOSIS — G8929 Other chronic pain: Secondary | ICD-10-CM

## 2023-11-16 MED ORDER — GADOPICLENOL 0.5 MMOL/ML IV SOLN
6.0000 mL | Freq: Once | INTRAVENOUS | Status: AC | PRN
Start: 2023-11-16 — End: 2023-11-16
  Administered 2023-11-16: 6 mL via INTRAVENOUS

## 2023-11-20 ENCOUNTER — Ambulatory Visit: Payer: Self-pay | Admitting: Podiatry

## 2023-12-25 ENCOUNTER — Encounter: Admitting: Family Medicine

## 2024-02-20 ENCOUNTER — Other Ambulatory Visit: Payer: Self-pay | Admitting: Nurse Practitioner

## 2024-02-20 DIAGNOSIS — Z1231 Encounter for screening mammogram for malignant neoplasm of breast: Secondary | ICD-10-CM

## 2024-03-05 ENCOUNTER — Ambulatory Visit
Admission: RE | Admit: 2024-03-05 | Discharge: 2024-03-05 | Disposition: A | Discharge status: Home / Self Care | Source: Ambulatory Visit | Attending: Nurse Practitioner | Admitting: Nurse Practitioner

## 2024-03-05 DIAGNOSIS — Z1231 Encounter for screening mammogram for malignant neoplasm of breast: Secondary | ICD-10-CM
